# Patient Record
Sex: Female | Born: 1960 | Race: Black or African American | Hispanic: No | Marital: Single | Smoking: Never smoker
Health system: Southern US, Community
[De-identification: ages and names within clinical notes are randomized; demographics above are authoritative.]

## PROBLEM LIST (undated history)

## (undated) DIAGNOSIS — E559 Vitamin D deficiency, unspecified: Secondary | ICD-10-CM

## (undated) DIAGNOSIS — K219 Gastro-esophageal reflux disease without esophagitis: Secondary | ICD-10-CM

## (undated) DIAGNOSIS — H409 Unspecified glaucoma: Secondary | ICD-10-CM

## (undated) DIAGNOSIS — E785 Hyperlipidemia, unspecified: Secondary | ICD-10-CM

## (undated) DIAGNOSIS — R87619 Unspecified abnormal cytological findings in specimens from cervix uteri: Secondary | ICD-10-CM

## (undated) DIAGNOSIS — R7303 Prediabetes: Secondary | ICD-10-CM

## (undated) DIAGNOSIS — I10 Essential (primary) hypertension: Secondary | ICD-10-CM

## (undated) HISTORY — DX: Hyperlipidemia, unspecified: E78.5

## (undated) HISTORY — DX: Essential (primary) hypertension: I10

## (undated) HISTORY — DX: Vitamin D deficiency, unspecified: E55.9

## (undated) HISTORY — DX: Unspecified glaucoma: H40.9

## (undated) HISTORY — DX: Prediabetes: R73.03

## (undated) HISTORY — DX: Unspecified abnormal cytological findings in specimens from cervix uteri: R87.619

---

## 2006-10-03 ENCOUNTER — Ambulatory Visit (HOSPITAL_COMMUNITY): Admission: RE | Admit: 2006-10-03 | Discharge: 2006-10-03 | Payer: Self-pay | Admitting: Internal Medicine

## 2007-11-06 ENCOUNTER — Ambulatory Visit: Payer: Self-pay | Admitting: Family Medicine

## 2007-11-06 ENCOUNTER — Ambulatory Visit: Payer: Self-pay | Admitting: *Deleted

## 2008-05-01 ENCOUNTER — Ambulatory Visit: Payer: Self-pay | Admitting: Family Medicine

## 2008-07-17 ENCOUNTER — Ambulatory Visit: Payer: Self-pay | Admitting: Family Medicine

## 2008-10-28 ENCOUNTER — Ambulatory Visit: Payer: Self-pay | Admitting: Family Medicine

## 2008-12-09 ENCOUNTER — Ambulatory Visit: Payer: Self-pay | Admitting: Family Medicine

## 2008-12-23 ENCOUNTER — Telehealth (INDEPENDENT_AMBULATORY_CARE_PROVIDER_SITE_OTHER): Payer: Self-pay | Admitting: *Deleted

## 2008-12-24 ENCOUNTER — Emergency Department (HOSPITAL_COMMUNITY): Admission: EM | Admit: 2008-12-24 | Discharge: 2008-12-24 | Payer: Self-pay | Admitting: Family Medicine

## 2009-01-09 ENCOUNTER — Emergency Department (HOSPITAL_COMMUNITY): Admission: EM | Admit: 2009-01-09 | Discharge: 2009-01-09 | Payer: Self-pay | Admitting: Emergency Medicine

## 2009-01-20 ENCOUNTER — Emergency Department (HOSPITAL_COMMUNITY): Admission: EM | Admit: 2009-01-20 | Discharge: 2009-01-20 | Payer: Self-pay | Admitting: Emergency Medicine

## 2009-01-20 ENCOUNTER — Emergency Department (HOSPITAL_COMMUNITY): Admission: EM | Admit: 2009-01-20 | Discharge: 2009-01-20 | Payer: Self-pay | Admitting: Family Medicine

## 2009-02-20 ENCOUNTER — Ambulatory Visit: Payer: Self-pay | Admitting: Family Medicine

## 2009-03-24 ENCOUNTER — Ambulatory Visit: Payer: Self-pay | Admitting: Family Medicine

## 2009-03-24 LAB — CONVERTED CEMR LAB: GC Probe Amp, Genital: NEGATIVE

## 2009-08-25 ENCOUNTER — Ambulatory Visit: Payer: Self-pay | Admitting: Family Medicine

## 2009-09-01 ENCOUNTER — Ambulatory Visit (HOSPITAL_COMMUNITY): Admission: RE | Admit: 2009-09-01 | Discharge: 2009-09-01 | Payer: Self-pay | Admitting: Family Medicine

## 2009-10-13 ENCOUNTER — Ambulatory Visit: Payer: Self-pay | Admitting: Family Medicine

## 2009-12-02 ENCOUNTER — Ambulatory Visit: Payer: Self-pay | Admitting: Obstetrics and Gynecology

## 2009-12-14 ENCOUNTER — Encounter (INDEPENDENT_AMBULATORY_CARE_PROVIDER_SITE_OTHER): Payer: Self-pay | Admitting: *Deleted

## 2009-12-14 ENCOUNTER — Ambulatory Visit (HOSPITAL_COMMUNITY): Admission: RE | Admit: 2009-12-14 | Discharge: 2009-12-14 | Payer: Self-pay | Admitting: Family Medicine

## 2009-12-17 ENCOUNTER — Ambulatory Visit: Payer: Self-pay | Admitting: Obstetrics and Gynecology

## 2009-12-17 ENCOUNTER — Ambulatory Visit (HOSPITAL_COMMUNITY): Admission: RE | Admit: 2009-12-17 | Discharge: 2009-12-17 | Payer: Self-pay | Admitting: Anesthesiology

## 2009-12-23 ENCOUNTER — Encounter (INDEPENDENT_AMBULATORY_CARE_PROVIDER_SITE_OTHER): Payer: Self-pay | Admitting: *Deleted

## 2010-01-06 ENCOUNTER — Ambulatory Visit: Payer: Self-pay | Admitting: Obstetrics & Gynecology

## 2010-01-06 LAB — CONVERTED CEMR LAB: Pap Smear: NEGATIVE

## 2010-01-07 ENCOUNTER — Encounter: Payer: Self-pay | Admitting: Obstetrics & Gynecology

## 2010-01-07 LAB — CONVERTED CEMR LAB
Trich, Wet Prep: NONE SEEN
Yeast Wet Prep HPF POC: NONE SEEN

## 2010-01-29 ENCOUNTER — Encounter: Payer: Self-pay | Admitting: Advanced Practice Midwife

## 2010-01-29 ENCOUNTER — Ambulatory Visit: Payer: Self-pay | Admitting: Obstetrics & Gynecology

## 2010-01-31 ENCOUNTER — Encounter: Payer: Self-pay | Admitting: Advanced Practice Midwife

## 2010-03-23 ENCOUNTER — Ambulatory Visit: Payer: Self-pay | Admitting: Gastroenterology

## 2010-03-23 ENCOUNTER — Encounter (INDEPENDENT_AMBULATORY_CARE_PROVIDER_SITE_OTHER): Payer: Self-pay | Admitting: *Deleted

## 2010-03-23 DIAGNOSIS — R143 Flatulence: Secondary | ICD-10-CM

## 2010-03-23 DIAGNOSIS — R142 Eructation: Secondary | ICD-10-CM

## 2010-03-23 DIAGNOSIS — R141 Gas pain: Secondary | ICD-10-CM | POA: Insufficient documentation

## 2010-03-24 LAB — CONVERTED CEMR LAB
ALT: 26 units/L (ref 0–35)
Basophils Relative: 0.5 % (ref 0.0–3.0)
Calcium: 9.5 mg/dL (ref 8.4–10.5)
Creatinine, Ser: 0.8 mg/dL (ref 0.4–1.2)
Eosinophils Relative: 2.1 % (ref 0.0–5.0)
GFR calc non Af Amer: 101.12 mL/min (ref >60.00)
Glucose, Bld: 100 mg/dL — ABNORMAL HIGH (ref 70–99)
Hemoglobin: 13 g/dL (ref 12.0–15.0)
Lymphocytes Relative: 41.8 % (ref 12.0–46.0)
Lymphs Abs: 2.5 10*3/uL (ref 0.7–4.0)
MCV: 88.5 fL (ref 78.0–100.0)
Monocytes Absolute: 0.6 10*3/uL (ref 0.1–1.0)
Neutro Abs: 2.7 10*3/uL (ref 1.4–7.7)
Platelets: 292 10*3/uL (ref 150.0–400.0)
Potassium: 4 meq/L (ref 3.5–5.1)
RBC: 4.34 M/uL (ref 3.87–5.11)
Total Bilirubin: 0.8 mg/dL (ref 0.3–1.2)

## 2010-05-05 ENCOUNTER — Other Ambulatory Visit: Payer: Self-pay | Admitting: Gastroenterology

## 2010-05-05 NOTE — Letter (Signed)
Summary: New Patient letter  [facility]  [address] [place], [place] [address]   Phone: [phone]  Fax: [phone]       12/23/2009 MRN: [id number]  [facility] [address] [place], [place]  [address]  Dear Ms. Domine,  Welcome to the Gastroenterology Division at [facility].    You are scheduled to see Dr.  Christella Hartigan on 02-05-10 at 10:30a.m. on the 3rd floor at [facility], [address]  We ask that you try to arrive at our office 15 minutes prior to your appointment time to allow for check-in.  We would like you to complete the enclosed self-administered evaluation form prior to your visit and bring it with you on the day of your appointment.  We will review it with you.  Also, please bring a complete list of all your medications or, if you prefer, bring the medication bottles and we will list them.  Please bring your insurance card so that we may make a copy of it.  If your insurance requires a referral to see a specialist, please bring your referral form from your primary care physician.  Co-payments are due at the time of your visit and may be paid by cash, check or credit card.     Your office visit will consist of a consult with your physician (includes a physical exam), any laboratory testing he/she may order, scheduling of any necessary diagnostic testing (e.g. x-ray, ultrasound, CT-scan), and scheduling of a procedure (e.g. Endoscopy, Colonoscopy) if required.  Please allow enough time on your schedule to allow for any/all of these possibilities.    If you cannot keep your appointment, please call [phone] to cancel or reschedule prior to your appointment date.  This allows [place] the opportunity to schedule an appointment for another patient in need of care.  If you do not cancel or reschedule by 5 p.m. the business day prior to your appointment date, you will be charged a $50.00 late cancellation/no-show fee.    Thank you for choosing   Gastroenterology for your medical needs.  We appreciate the opportunity to care for you.  Please visit [place] at our website  to learn more about our practice.                     Sincerely,                                                             The Gastroenterology Division

## 2010-05-06 NOTE — Assessment & Plan Note (Signed)
   History of Present Illness Visit Type: Initial Consult Primary GI MD: Rob Bunting MD Primary Iven Earnhart: Wilburn Cornelia Requesting Francia Verry: Aneta Mins Rose,MD Chief Complaint: Abdominal bloating History of Present Illness:     very pleasant 50 year old African born woman who has had postprandial bloating, minor early satiety.  No particular foods are the culprit.eating causes bloating.  Early satiety.  This has been going on for about two years. She tried antiacid medicine in the past, without much help.  Overall stable weight in past year. No nausea or vomiting.   No real abd pains.    she had an abdominal ultrasound 3 months ago and it was normal.  no nsaids           Current Medications (verified): 1)  None  Allergies (verified): No Known Drug Allergies  Past History:  Past Medical History: none  Past Surgical History: c-section 13 years ago  Family History: no gastric disease  Social History: single  one child non smoker non drinker works part time at [employer]  Review of Systems       Pertinent positive and negative review of systems were noted in the above HPI and GI specific review of systems.  All other review of systems was otherwise negative.   Vital Signs:  Patient profile:   50 year old female Height:      66 inches Weight:      181 pounds BMI:     29.32 BSA:     1.92 Pulse rate:   80 / minute Pulse rhythm:   regular BP sitting:   92 / 62  (left arm)  Vitals Entered By: Merri Ray CMA Duncan Dull) (March 23, 2010 1:30 PM)  Physical Exam  Additional Exam:  Constitutional: generally well appearing Psychiatric: alert and oriented times 3 Eyes: extraocular movements intact Mouth: oropharynx moist, no lesions Neck: supple, no lymphadenopathy Cardiovascular: heart regular rate and rythm Lungs: CTA bilaterally Abdomen: soft, non-tender, non-distended, no obvious ascites, no peritoneal signs, normal bowel sounds Extremities: no lower  extremity edema bilaterally Skin: no lesions on visible extremities    Impression & Recommendations:  Problem # 1:  dyspepsia, bloating she will get basic set of labs today including a CBC and complete metabolic profile and we will arrange for upper endoscopy at her soonest convenience.  Other Orders: TLB-CBC Platelet - w/Differential (85025-CBCD) TLB-CMP (Comprehensive Metabolic Pnl) (80053-COMP)  Patient Instructions: 1)  You will get lab test(s) done today (cbc, cmet). 2)  You will be scheduled to have an upper endoscopy. 3)  The medication list was reviewed and reconciled.  All changed / newly prescribed medications were explained.  A complete medication list was provided to the patient / caregiver.  Appended Document: Orders Update/EGD    Clinical Lists Changes  Orders: Added new Test order of EGD (EGD) - Signed

## 2010-05-06 NOTE — Letter (Signed)
Summary: EGD Instructions  [facility]  [address] [place], [place] [address]   Phone: [phone]  Fax: [phone]       Amy [name]    06-21-1960    MRN: [id number]       Procedure Day /Date:05/05/10 WED     Arrival Time: 130 pm     Procedure Time:230 pm     Location of Procedure:                    [facility] (4th Floor)    PREPARATION FOR ENDOSCOPY   On2/1/12 THE DAY OF THE PROCEDURE:  1.   No solid foods, milk or milk products are allowed after midnight the night before your procedure.  2.   Do not drink anything colored red or purple.  Avoid juices with pulp.  No orange juice.  3.  You may drink clear liquids until 1230 pm  which is 2 hours before your procedure.                                                                                                CLEAR LIQUIDS INCLUDE: Water Jello Ice Popsicles Tea (sugar ok, no milk/cream) Powdered fruit flavored drinks Coffee (sugar ok, no milk/cream) Gatorade Juice: apple, white grape, white cranberry  Lemonade Clear bullion, consomm, broth Carbonated beverages (any kind) Strained chicken noodle soup Hard Candy   MEDICATION INSTRUCTIONS  Unless otherwise instructed, you should take regular prescription medications with a small sip of water as early as possible the morning of your procedure.             OTHER INSTRUCTIONS  You will need a responsible adult at least 49 years of age to accompany you and drive you home.   This person must remain in the waiting room during your procedure.  Wear loose fitting clothing that is easily removed.  Leave jewelry and other valuables at home.  However, you may wish to bring a book to read or an iPod/MP3 player to listen to music as you wait for your procedure to start.  Remove all body piercing jewelry and leave at home.  Total time from sign-in until discharge is approximately 2-3 hours.  You should go home directly after  your procedure and rest.  You can resume normal activities the day after your procedure.  The day of your procedure you should not:   Drive   Make legal decisions   Operate machinery   Drink alcohol   Return to work  You will receive specific instructions about eating, activities and medications before you leave.    The above instructions have been reviewed and explained to me by   _______________________    I fully understand and can verbalize these instructions _____________________________ Date _________

## 2010-06-08 ENCOUNTER — Inpatient Hospital Stay (HOSPITAL_COMMUNITY)
Admission: RE | Admit: 2010-06-08 | Discharge: 2010-06-08 | Disposition: A | Discharge status: Home / Self Care | Payer: Self-pay | Source: Ambulatory Visit | Attending: Family Medicine | Admitting: Family Medicine

## 2010-06-18 LAB — POCT PREGNANCY, URINE: Preg Test, Ur: NEGATIVE

## 2010-06-30 ENCOUNTER — Ambulatory Visit (INDEPENDENT_AMBULATORY_CARE_PROVIDER_SITE_OTHER): Payer: Self-pay | Admitting: Obstetrics & Gynecology

## 2010-06-30 DIAGNOSIS — N899 Noninflammatory disorder of vagina, unspecified: Secondary | ICD-10-CM

## 2010-07-01 ENCOUNTER — Encounter: Payer: Self-pay | Admitting: *Deleted

## 2010-07-01 LAB — CONVERTED CEMR LAB
Clue Cells Wet Prep HPF POC: NONE SEEN
Yeast Wet Prep HPF POC: NONE SEEN

## 2010-07-01 NOTE — Progress Notes (Signed)
NAMEOCEANIA, NOORI                ACCOUNT NO.:  [id number]  MEDICAL RECORD NO.:  [id number]           PATIENT TYPE:  A  LOCATION:  [facility]                   FACILITY:  [facility]  PHYSICIAN:  Scheryl Darter, MD       DATE OF BIRTH:  05/25/1960  DATE OF SERVICE:  06/30/2010                                 CLINIC NOTE  HISTORY OF PRESENT ILLNESS:  The patient comes today with recurrent symptoms of external vaginal irritation.  She notes no discharge or odor.  She was screened for possible genital herpes at her last visit in October and serology was negative for HSV-2.  She says that these symptoms recur every few weeks and has burning at the left side of her introitus.  It is not related to intercourse.  She denies any invasive vasomotor symptoms.  PHYSICAL EXAMINATION:  GENERAL:  She is in no acute distress with normal affect. PELVIC:  Normal-appearing external genitalia.  At the introitus and vagina, the mucosa appears somewhat pale and may be showing early signs of atrophy.  Her pain appears to be at the hymeneal ring on the left side.  Cervix appeared normal.  Wet prep was obtained.  No pelvic masses or tenderness.  IMPRESSION:  Vulvar irritation in a 50 year old female with mild signs of vaginal atrophy.  I will try supplemental estrogen with Estrace cream 0.01%, 1 gram per vagina 3 times a week.  She will return in 6-8 weeks to review her progress.     Scheryl Darter, MD    JA/MEDQ  D:  06/30/2010  T:  07/01/2010  Job:  [id number]

## 2010-07-08 LAB — CBC
HCT: 37.7 % (ref 36.0–46.0)
MCHC: 34.4 g/dL (ref 30.0–36.0)
MCV: 93.2 fL (ref 78.0–100.0)
RDW: 14.2 % (ref 11.5–15.5)

## 2010-07-08 LAB — COMPREHENSIVE METABOLIC PANEL
ALT: 24 U/L (ref 0–35)
Albumin: 3.7 g/dL (ref 3.5–5.2)
Alkaline Phosphatase: 44 U/L (ref 39–117)
CO2: 25 mEq/L (ref 19–32)
Calcium: 9 mg/dL (ref 8.4–10.5)
Chloride: 104 mEq/L (ref 96–112)
Creatinine, Ser: 0.91 mg/dL (ref 0.4–1.2)
Glucose, Bld: 97 mg/dL (ref 70–99)
Total Bilirubin: 0.6 mg/dL (ref 0.3–1.2)
Total Protein: 6.8 g/dL (ref 6.0–8.3)

## 2010-07-08 LAB — POCT CARDIAC MARKERS: Troponin i, poc: <0.05 ng/mL (ref 0.00–0.09)

## 2010-07-08 LAB — LIPASE, BLOOD: Lipase: 18 U/L (ref 11–59)

## 2010-07-08 LAB — DIFFERENTIAL
Basophils Relative: 1 % (ref 0–1)
Eosinophils Relative: 3 % (ref 0–5)
Neutro Abs: 1.1 10*3/uL — ABNORMAL LOW (ref 1.7–7.7)

## 2010-08-25 ENCOUNTER — Ambulatory Visit: Payer: Self-pay | Admitting: Obstetrics & Gynecology

## 2010-08-25 ENCOUNTER — Ambulatory Visit: Payer: Self-pay | Admitting: Family Medicine

## 2010-09-23 ENCOUNTER — Other Ambulatory Visit (HOSPITAL_COMMUNITY)
Admission: RE | Admit: 2010-09-23 | Discharge: 2010-09-23 | Disposition: A | Discharge status: Home / Self Care | Payer: Self-pay | Source: Ambulatory Visit | Attending: Obstetrics & Gynecology | Admitting: Obstetrics & Gynecology

## 2010-09-23 ENCOUNTER — Ambulatory Visit (INDEPENDENT_AMBULATORY_CARE_PROVIDER_SITE_OTHER): Payer: Self-pay | Admitting: Obstetrics & Gynecology

## 2010-09-23 ENCOUNTER — Other Ambulatory Visit: Payer: Self-pay | Admitting: Obstetrics & Gynecology

## 2010-09-23 DIAGNOSIS — D259 Leiomyoma of uterus, unspecified: Secondary | ICD-10-CM

## 2010-09-23 DIAGNOSIS — N898 Other specified noninflammatory disorders of vagina: Secondary | ICD-10-CM

## 2010-09-23 DIAGNOSIS — N843 Polyp of vulva: Secondary | ICD-10-CM | POA: Insufficient documentation

## 2010-09-24 NOTE — Group Therapy Note (Unsigned)
NAMESEDALIA, GREESON                ACCOUNT NO.:  [id number]  MEDICAL RECORD NO.:  [id number]           PATIENT TYPE:  A  LOCATION:  [facility]                   FACILITY:  [facility]  PHYSICIAN:  Allie Bossier, MD        DATE OF BIRTH:  [birth year]  DATE OF SERVICE:  09/23/2010                                 CLINIC NOTE  Ms. Guercio is a 50 year old divorced African American gravida 2, para 1, abortus 1 who has been seen in the clinic on several times for vulvar lesions.  She says that for the last 3 years she has had one specific area just to the outside of her labia minora on the left.  This itching, burning type pain comes every few weeks for the last 3 years.  She was given triamcinolone cream and that did not help.  She was given 2% Xylocaine gel and that did not help.  She has had HSV culture which was negative.  The HSV II IgG was negative and HSV-1 IgG titer was in the exposure range.  On exam, there is a slight cracked open area just to the outside of her labia minora.  This is where she points to and says this is the location of her recurrent pain.  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  Therefore, I explained the procedure. Consents were signed, questions were answered and that area of her vulva was prepped with iodine.  It was then numbed with approximately 2 mL of 1% lidocaine and small (3 mm) portion of skin was removed.  Silver nitrate was used to yield excellent hemostasis.  She will come back in 2 weeks for her results.     Allie Bossier, MD    MCD/MEDQ  D:  09/23/2010  T:  09/24/2010  Job:  [id number]

## 2010-10-07 ENCOUNTER — Ambulatory Visit (INDEPENDENT_AMBULATORY_CARE_PROVIDER_SITE_OTHER): Payer: Self-pay | Admitting: Family Medicine

## 2010-10-07 DIAGNOSIS — D4959 Neoplasm of unspecified behavior of other genitourinary organ: Secondary | ICD-10-CM

## 2010-10-08 NOTE — Group Therapy Note (Signed)
Amy [name], Amy [name]                ACCOUNT NO.:  [id number]  MEDICAL RECORD NO.:  [id number]           PATIENT TYPE:  A  LOCATION:  [facility]                   FACILITY:  [facility]  PHYSICIAN:  Tinnie Gens, MD        DATE OF BIRTH:  1960/10/16  DATE OF SERVICE:  10/07/2010                                 CLINIC NOTE  CHIEF COMPLAINT:  Followup vulvar biopsy.  HISTORY OF PRESENT ILLNESS:  The patient is a 50 year old G2, P1-0-1-1, who underwent a vulvar biopsy on September 23, 2010, with Dr. Nicholaus Bloom.  Her results are back today and reviewed with the patient.  The vulvar biopsy results are as follows, benign fibroid epithelial polyp.  No dysplasia or evidence of malignancy.  The patient also underwent STD testing which did not really show her to have HSV.  The patient has irritation, would like something to treat this area.  We discussed possible causes of this finding.  PHYSICAL EXAMINATION:  VITAL SIGNS:  Are as noted in the chart. GENERAL:  Well-developed, well-nourished female in no acute distress. ABDOMEN:  Soft, nontender, nondistended.  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  PLAN:  I have given her 1% hydrocortisone cream very low dose of low- potency and to be applied very sparingly to affected area, hopefully this will help with that and discussed the proper hygiene issues as well.  The patient will follow up as needed.          ______________________________ Tinnie Gens, MD    TP/MEDQ  D:  10/07/2010  T:  10/08/2010  Job:  [id number]

## 2010-11-17 ENCOUNTER — Inpatient Hospital Stay (INDEPENDENT_AMBULATORY_CARE_PROVIDER_SITE_OTHER)
Admission: RE | Admit: 2010-11-17 | Discharge: 2010-11-17 | Disposition: A | Discharge status: Home / Self Care | Payer: Self-pay | Source: Ambulatory Visit | Attending: Family Medicine | Admitting: Family Medicine

## 2010-11-17 DIAGNOSIS — K209 Esophagitis, unspecified without bleeding: Secondary | ICD-10-CM

## 2010-11-17 LAB — BILIRUBIN, TOTAL: Total Bilirubin: 0.2 mg/dL — ABNORMAL LOW (ref 0.3–1.2)

## 2010-11-17 LAB — AST: AST: 18 U/L (ref 0–37)

## 2010-11-17 LAB — ALT: ALT: 19 U/L (ref 0–35)

## 2010-11-17 LAB — GAMMA GT: GGT: 16 U/L (ref 7–51)

## 2010-11-17 LAB — AMYLASE: Amylase: 80 U/L (ref 0–105)

## 2010-11-17 LAB — LIPASE, BLOOD: Lipase: 33 U/L (ref 11–59)

## 2011-01-03 ENCOUNTER — Other Ambulatory Visit: Payer: Self-pay | Admitting: Family Medicine

## 2011-10-06 ENCOUNTER — Emergency Department (INDEPENDENT_AMBULATORY_CARE_PROVIDER_SITE_OTHER)
Admission: EM | Admit: 2011-10-06 | Discharge: 2011-10-06 | Disposition: A | Discharge status: Home / Self Care | Payer: Self-pay | Source: Home / Self Care | Attending: Emergency Medicine | Admitting: Emergency Medicine

## 2011-10-06 ENCOUNTER — Encounter (HOSPITAL_COMMUNITY): Payer: Self-pay

## 2011-10-06 DIAGNOSIS — K219 Gastro-esophageal reflux disease without esophagitis: Secondary | ICD-10-CM

## 2011-10-06 HISTORY — DX: Gastro-esophageal reflux disease without esophagitis: K21.9

## 2011-10-06 MED ORDER — FAMOTIDINE 20 MG PO TABS: 20.0000 mg | ORAL_TABLET | Freq: Two times a day (BID) | ORAL | Status: DC

## 2011-10-06 MED ORDER — PANTOPRAZOLE SODIUM 40 MG PO TBEC: 40.0000 mg | DELAYED_RELEASE_TABLET | Freq: Every day | ORAL | Status: DC

## 2011-10-06 MED ORDER — GI COCKTAIL ~~LOC~~
ORAL | Status: AC
  Filled 2011-10-06: qty 30

## 2011-10-06 MED ORDER — SUCRALFATE 1 GM/10ML PO SUSP: 1.0000 g | Freq: Four times a day (QID) | ORAL | Status: DC

## 2011-10-06 MED ORDER — GI COCKTAIL ~~LOC~~
30.0000 mL | Freq: Once | ORAL | Status: AC
  Administered 2011-10-06: 30 mL via ORAL

## 2011-10-06 NOTE — ED Notes (Addendum)
Patient here for evaluation of pain in her epigastric area. States the pain woke her at 12 mn, couldn't go back to sleep due to pain . Came to [facility] at 6am, and returned at 11 am when clinic scheduled to open for evaluation. Pain in epigastric area w/o radiation, pain worse w direct palpation of costochondral margin, denies radiation of pain . C/o pain at 12 mn caused  nausea w/o vomiting, c/o dizziness, no sweating /o pain pulsing and pounding, felt like her heart was racing

## 2011-10-06 NOTE — ED Provider Notes (Signed)
History     CSN: [id number]  Arrival date & time 10/06/11  1123   First MD Initiated Contact with Patient 10/06/11 1125      Chief Complaint  Patient presents with  . Chest Pain    (Consider location/radiation/quality/duration/timing/severity/associated sxs/prior treatment) HPI Comments: Patient reports waking up with substernal "burning" chest discomfort this morning. It is worse with lying down, and with lying on her side, and with eating. It not affected with exertion, movement. She has not tried anything for this. No nausea, vomiting, diaphoresis, radiation of her neck or down her arm, or through to her back. No coughing, wheezing, shortness of breath. No fevers. No abdominal pain. No change in her diet. No change in her level of physical activity. She's been in usual state of health up until today. Patient was seen in the [facility] on 11/17/2010 for central substernal chest pain, was thought to have esophagitis/GERD. Similar symptoms in December 2011, was scheduled to have upper endoscopy, which she states that she did not have. Had a negative cardiac workup and a normal abdominal ultrasound in the ER in 01/2009. Patient states that today's pain is identical to this pain that she's had since 2010.   ROS as noted in HPI. All other ROS negative.    Patient is a 51 y.o. female presenting with chest pain. The history is provided by the patient.  Chest Pain The chest pain began 6 - 12 hours ago. Chest pain occurs constantly. The chest pain is unchanged. The pain is associated with eating. The quality of the pain is described as burning (throbbing). The pain does not radiate. Chest pain is worsened by certain positions and eating. Pertinent negatives for primary symptoms include no fever, no shortness of breath, no cough, no wheezing, no palpitations, no abdominal pain, no nausea and no vomiting.  Pertinent negatives for associated symptoms include no weakness. She tried nothing for the symptoms.    Pertinent negatives for past medical history include no CAD, no diabetes, no hyperlipidemia, no hypertension, no MI and no strokes.  Pertinent negatives for family medical history include: no early MI in family.     Past Medical History  Diagnosis Date  . GERD (gastroesophageal reflux disease)     History reviewed. No pertinent past surgical history.  History reviewed. No pertinent family history.  History  Substance Use Topics  . Smoking status: Never Smoker   . Smokeless tobacco: Not on file  . Alcohol Use: No    OB History    Grav Para Term Preterm Abortions TAB SAB Ect Mult Living                  Review of Systems  Constitutional: Negative for fever.  Respiratory: Negative for cough, shortness of breath and wheezing.   Cardiovascular: Positive for chest pain. Negative for palpitations.  Gastrointestinal: Negative for nausea, vomiting and abdominal pain.  Neurological: Negative for weakness.    Allergies  Review of patient's allergies indicates no known allergies.  Home Medications   Current Outpatient Rx  Name Route Sig Dispense Refill  . FAMOTIDINE 20 MG PO TABS Oral Take 1 tablet (20 mg total) by mouth 2 (two) times daily. 40 tablet 0  . PANTOPRAZOLE SODIUM 40 MG PO TBEC Oral Take 1 tablet (40 mg total) by mouth daily. 20 tablet 0  . SUCRALFATE 1 GM/10ML PO SUSP Oral Take 10 mLs (1 g total) by mouth 4 (four) times daily. 10 mL before meals and before bedtime 240  mL 0    BP 108/69  Pulse 72  Temp 98.3 F (36.8 C) (Oral)  Resp 10  SpO2 99%  LMP 09/15/2011  Physical Exam  Nursing note and vitals reviewed. Constitutional: She is oriented to person, place, and time. She appears well-developed and well-nourished. No distress.  HENT:  Head: Normocephalic and atraumatic.  Eyes: Conjunctivae and EOM are normal.  Neck: Normal range of motion.  Cardiovascular: Normal rate, regular rhythm, normal heart sounds and intact distal pulses.   Pulmonary/Chest:  Effort normal and breath sounds normal.    Abdominal: Soft. Bowel sounds are normal. She exhibits no distension and no mass. There is tenderness. There is no rebound and no guarding.  Musculoskeletal: Normal range of motion. She exhibits no edema.  Neurological: She is alert and oriented to person, place, and time. Coordination normal.  Skin: Skin is warm and dry.  Psychiatric: She has a normal mood and affect. Her behavior is normal. Judgment and thought content normal.    ED Course  Procedures (including critical care time)  Labs Reviewed - No data to display No results found.   1. GERD (gastroesophageal reflux disease)       MDM  Previous records reviewed. As noted in history of present illness.  EKG: Normal sinus rhythm, rate 66, normal axis, normal intervals. No hypertrophy. No ST T-wave changes compared to EKG from August 2012.  Patient states the pain is identical to pain that she's been having since 2010. Her EKG is normal. H&P most consistent with esophagitis/reflux. Low suspicion for ACS. gave GI cocktail here with improvement on reevaluation. Will start her on some Pepcid, Protonix, carafate and have her followup with her primary care physician at [facility]. Discussed signs and symptoms that should prompt her return to the ER. Patient agrees with plan.  Luiz Blare, MD 10/06/11 1254

## 2012-04-16 ENCOUNTER — Other Ambulatory Visit: Payer: Self-pay | Admitting: Obstetrics and Gynecology

## 2012-04-16 DIAGNOSIS — Z1231 Encounter for screening mammogram for malignant neoplasm of breast: Secondary | ICD-10-CM

## 2012-04-26 ENCOUNTER — Ambulatory Visit (HOSPITAL_COMMUNITY)
Admission: RE | Admit: 2012-04-26 | Discharge: 2012-04-26 | Disposition: A | Discharge status: Home / Self Care | Payer: BC Managed Care – PPO | Source: Ambulatory Visit | Attending: Obstetrics and Gynecology | Admitting: Obstetrics and Gynecology

## 2012-04-26 DIAGNOSIS — Z1231 Encounter for screening mammogram for malignant neoplasm of breast: Secondary | ICD-10-CM

## 2012-07-03 ENCOUNTER — Emergency Department (HOSPITAL_BASED_OUTPATIENT_CLINIC_OR_DEPARTMENT_OTHER)
Admission: EM | Admit: 2012-07-03 | Discharge: 2012-07-04 | Disposition: A | Discharge status: Home / Self Care | Payer: BC Managed Care – PPO | Attending: Emergency Medicine | Admitting: Emergency Medicine

## 2012-07-03 ENCOUNTER — Encounter (HOSPITAL_BASED_OUTPATIENT_CLINIC_OR_DEPARTMENT_OTHER): Payer: Self-pay | Admitting: *Deleted

## 2012-07-03 DIAGNOSIS — R197 Diarrhea, unspecified: Secondary | ICD-10-CM | POA: Insufficient documentation

## 2012-07-03 DIAGNOSIS — R112 Nausea with vomiting, unspecified: Secondary | ICD-10-CM

## 2012-07-03 DIAGNOSIS — R1013 Epigastric pain: Secondary | ICD-10-CM | POA: Insufficient documentation

## 2012-07-03 DIAGNOSIS — K219 Gastro-esophageal reflux disease without esophagitis: Secondary | ICD-10-CM | POA: Insufficient documentation

## 2012-07-03 DIAGNOSIS — R630 Anorexia: Secondary | ICD-10-CM | POA: Insufficient documentation

## 2012-07-03 DIAGNOSIS — Z3202 Encounter for pregnancy test, result negative: Secondary | ICD-10-CM | POA: Insufficient documentation

## 2012-07-03 DIAGNOSIS — Z79899 Other long term (current) drug therapy: Secondary | ICD-10-CM | POA: Insufficient documentation

## 2012-07-03 LAB — URINALYSIS, ROUTINE W REFLEX MICROSCOPIC
Bilirubin Urine: NEGATIVE
Nitrite: NEGATIVE
Specific Gravity, Urine: 1.016 (ref 1.005–1.030)
pH: 7.5 (ref 5.0–8.0)

## 2012-07-03 LAB — CBC WITH DIFFERENTIAL/PLATELET
Eosinophils Absolute: 0.1 10*3/uL (ref 0.0–0.7)
Hemoglobin: 11.4 g/dL — ABNORMAL LOW (ref 12.0–15.0)
Lymphs Abs: 0.7 10*3/uL (ref 0.7–4.0)
MCH: 24.3 pg — ABNORMAL LOW (ref 26.0–34.0)
Monocytes Relative: 8 % (ref 3–12)
Neutro Abs: 5.5 10*3/uL (ref 1.7–7.7)
Neutrophils Relative %: 81 % — ABNORMAL HIGH (ref 43–77)
RBC: 4.69 MIL/uL (ref 3.87–5.11)

## 2012-07-03 NOTE — ED Notes (Signed)
C/o upper bad pain since 7 pm with diarrhea and vomiting. Denies fever.

## 2012-07-03 NOTE — ED Notes (Signed)
Pt has not been taking meds for GERD

## 2012-07-04 LAB — COMPREHENSIVE METABOLIC PANEL
Alkaline Phosphatase: 54 U/L (ref 39–117)
BUN: 9 mg/dL (ref 6–23)
CO2: 26 mEq/L (ref 19–32)
Chloride: 103 mEq/L (ref 96–112)
GFR calc Af Amer: >90 mL/min (ref >90)
GFR calc non Af Amer: 85 mL/min — ABNORMAL LOW (ref >90)
Glucose, Bld: 144 mg/dL — ABNORMAL HIGH (ref 70–99)
Potassium: 4 mEq/L (ref 3.5–5.1)
Total Bilirubin: 0.3 mg/dL (ref 0.3–1.2)

## 2012-07-04 LAB — LIPASE, BLOOD: Lipase: 18 U/L (ref 11–59)

## 2012-07-04 MED ORDER — ONDANSETRON HCL 4 MG/2ML IJ SOLN
4.0000 mg | Freq: Once | INTRAMUSCULAR | Status: AC
  Administered 2012-07-04: 4 mg via INTRAVENOUS
  Filled 2012-07-04: qty 2

## 2012-07-04 MED ORDER — ONDANSETRON 8 MG PO TBDP: 8.0000 mg | ORAL_TABLET | Freq: Three times a day (TID) | ORAL | Status: DC | PRN

## 2012-07-04 MED ORDER — HYDROMORPHONE HCL PF 1 MG/ML IJ SOLN
1.0000 mg | Freq: Once | INTRAMUSCULAR | Status: AC
  Administered 2012-07-04: 1 mg via INTRAVENOUS
  Filled 2012-07-04: qty 1

## 2012-07-04 MED ORDER — SODIUM CHLORIDE 0.9 % IV BOLUS (SEPSIS)
1000.0000 mL | Freq: Once | INTRAVENOUS | Status: AC
  Administered 2012-07-04: 1000 mL via INTRAVENOUS

## 2012-07-04 NOTE — ED Provider Notes (Signed)
History     CSN: [id number]  Arrival date & time 07/03/12  2311   First MD Initiated Contact with Patient 07/04/12 0102      Chief Complaint  Patient presents with  . Abdominal Pain  . Emesis    (Consider location/radiation/quality/duration/timing/severity/associated sxs/prior treatment) Patient is a 52 y.o. female presenting with abdominal pain and vomiting. The history is provided by the patient. The history is limited by a language barrier. No language interpreter was used.  Abdominal Pain Pain location:  Epigastric Pain quality: burning   Pain radiates to:  Epigastric region Pain severity:  Severe Onset quality:  Sudden Timing:  Constant Progression:  Unchanged Chronicity:  New Context: eating and previous surgery   Context: not alcohol use, not awakening from sleep, not diet changes and not laxative use   Relieved by:  Nothing Worsened by:  Nothing tried Ineffective treatments:  OTC medications Associated symptoms: anorexia, diarrhea and vomiting   Risk factors: no alcohol abuse   Emesis Associated symptoms: abdominal pain and diarrhea     Past Medical History  Diagnosis Date  . GERD (gastroesophageal reflux disease)     History reviewed. No pertinent past surgical history.  History reviewed. No pertinent family history.  History  Substance Use Topics  . Smoking status: Never Smoker   . Smokeless tobacco: Not on file  . Alcohol Use: No    OB History   Grav Para Term Preterm Abortions TAB SAB Ect Mult Living                  Review of Systems  Gastrointestinal: Positive for vomiting, abdominal pain, diarrhea and anorexia.  All other systems reviewed and are negative.    Allergies  Review of patient's allergies indicates no known allergies.  Home Medications   Current Outpatient Rx  Name  Route  Sig  Dispense  Refill  . famotidine (PEPCID) 20 MG tablet   Oral   Take 1 tablet (20 mg total) by mouth 2 (two) times daily.   40 tablet   0   .  pantoprazole (PROTONIX) 40 MG tablet   Oral   Take 1 tablet (40 mg total) by mouth daily.   20 tablet   0   . EXPIRED: sucralfate (CARAFATE) 1 GM/10ML suspension   Oral   Take 10 mLs (1 g total) by mouth 4 (four) times daily. 10 mL before meals and before bedtime   240 mL   0     BP 128/78  Pulse 84  Temp(Src) 98.4 F (36.9 C) (Oral)  Resp 18  Ht 5\' 5"  (1.651 m)  Wt 186 lb (84.369 kg)  BMI 30.95 kg/m2  SpO2 97%  Physical Exam  Nursing note and vitals reviewed. Constitutional: She appears well-developed and well-nourished.  HENT:  Head: Normocephalic and atraumatic.  Eyes: Conjunctivae and EOM are normal. Pupils are equal, round, and reactive to light.  Neck: Normal range of motion. Neck supple.  Cardiovascular: Normal rate, regular rhythm, normal heart sounds and intact distal pulses.   Pulmonary/Chest: Effort normal and breath sounds normal.  Abdominal: Soft. Bowel sounds are normal. There is tenderness.  Mild epigastric tenderness  Musculoskeletal: Normal range of motion.  Neurological: She is alert.  Skin: Skin is warm and dry.  Psychiatric: She has a normal mood and affect. Thought content normal.    ED Course  Procedures (including critical care time)  Labs Reviewed  CBC WITH DIFFERENTIAL - Abnormal; Notable for the following:    Hemoglobin  11.4 (*)    MCH 24.3 (*)    Neutrophils Relative 81 (*)    Lymphocytes Relative 10 (*)    All other components within normal limits  COMPREHENSIVE METABOLIC PANEL - Abnormal; Notable for the following:    Glucose, Bld 144 (*)    GFR calc non Af Amer 85 (*)    All other components within normal limits  URINALYSIS, ROUTINE W REFLEX MICROSCOPIC  LIPASE, BLOOD   No results found.   No diagnosis found.    MDM  Patient feels improved after medications. Labs are can only for mild anemia with hemoglobin 11.4 and glucose of 144. Her abdomen is soft and nontender. She is taking    Hilario Quarry, MD 07/04/12 [phone]

## 2012-07-04 NOTE — ED Notes (Signed)
MD at bedside. 

## 2014-02-17 ENCOUNTER — Other Ambulatory Visit: Payer: Self-pay | Admitting: Obstetrics & Gynecology

## 2014-02-18 LAB — CYTOLOGY - PAP

## 2014-04-04 DIAGNOSIS — R9389 Abnormal findings on diagnostic imaging of other specified body structures: Secondary | ICD-10-CM

## 2014-04-04 DIAGNOSIS — N840 Polyp of corpus uteri: Secondary | ICD-10-CM

## 2014-04-04 HISTORY — PX: DILATION AND CURETTAGE OF UTERUS: SHX78

## 2014-04-04 HISTORY — DX: Polyp of corpus uteri: N84.0

## 2014-04-04 HISTORY — DX: Abnormal findings on diagnostic imaging of other specified body structures: R93.89

## 2016-02-27 DIAGNOSIS — M545 Low back pain: Secondary | ICD-10-CM | POA: Diagnosis not present

## 2016-02-27 DIAGNOSIS — Y93F2 Activity, caregiving, lifting: Secondary | ICD-10-CM | POA: Diagnosis not present

## 2016-02-29 DIAGNOSIS — Z01419 Encounter for gynecological examination (general) (routine) without abnormal findings: Secondary | ICD-10-CM | POA: Diagnosis not present

## 2016-03-17 DIAGNOSIS — Z3042 Encounter for surveillance of injectable contraceptive: Secondary | ICD-10-CM | POA: Diagnosis not present

## 2016-03-22 DIAGNOSIS — M545 Low back pain: Secondary | ICD-10-CM | POA: Diagnosis not present

## 2016-03-22 DIAGNOSIS — M549 Dorsalgia, unspecified: Secondary | ICD-10-CM | POA: Diagnosis not present

## 2016-04-19 DIAGNOSIS — R079 Chest pain, unspecified: Secondary | ICD-10-CM | POA: Diagnosis not present

## 2016-04-21 DIAGNOSIS — M65252 Calcific tendinitis, left thigh: Secondary | ICD-10-CM | POA: Diagnosis not present

## 2016-04-21 DIAGNOSIS — M47816 Spondylosis without myelopathy or radiculopathy, lumbar region: Secondary | ICD-10-CM | POA: Diagnosis not present

## 2016-04-21 DIAGNOSIS — S39012A Strain of muscle, fascia and tendon of lower back, initial encounter: Secondary | ICD-10-CM | POA: Diagnosis not present

## 2016-04-21 DIAGNOSIS — M5416 Radiculopathy, lumbar region: Secondary | ICD-10-CM | POA: Diagnosis not present

## 2016-04-21 DIAGNOSIS — S76012A Strain of muscle, fascia and tendon of left hip, initial encounter: Secondary | ICD-10-CM | POA: Diagnosis not present

## 2016-04-25 DIAGNOSIS — M545 Low back pain: Secondary | ICD-10-CM | POA: Diagnosis not present

## 2016-04-25 DIAGNOSIS — S76012D Strain of muscle, fascia and tendon of left hip, subsequent encounter: Secondary | ICD-10-CM | POA: Diagnosis not present

## 2016-04-25 DIAGNOSIS — S39012D Strain of muscle, fascia and tendon of lower back, subsequent encounter: Secondary | ICD-10-CM | POA: Diagnosis not present

## 2016-04-25 DIAGNOSIS — M6281 Muscle weakness (generalized): Secondary | ICD-10-CM | POA: Diagnosis not present

## 2016-04-26 DIAGNOSIS — H401134 Primary open-angle glaucoma, bilateral, indeterminate stage: Secondary | ICD-10-CM | POA: Diagnosis not present

## 2016-04-26 DIAGNOSIS — S39012D Strain of muscle, fascia and tendon of lower back, subsequent encounter: Secondary | ICD-10-CM | POA: Diagnosis not present

## 2016-04-26 DIAGNOSIS — M6281 Muscle weakness (generalized): Secondary | ICD-10-CM | POA: Diagnosis not present

## 2016-04-26 DIAGNOSIS — S76012D Strain of muscle, fascia and tendon of left hip, subsequent encounter: Secondary | ICD-10-CM | POA: Diagnosis not present

## 2016-04-26 DIAGNOSIS — M545 Low back pain: Secondary | ICD-10-CM | POA: Diagnosis not present

## 2016-05-04 DIAGNOSIS — M545 Low back pain: Secondary | ICD-10-CM | POA: Diagnosis not present

## 2016-05-04 DIAGNOSIS — S39012D Strain of muscle, fascia and tendon of lower back, subsequent encounter: Secondary | ICD-10-CM | POA: Diagnosis not present

## 2016-05-04 DIAGNOSIS — M6281 Muscle weakness (generalized): Secondary | ICD-10-CM | POA: Diagnosis not present

## 2016-05-04 DIAGNOSIS — S76012D Strain of muscle, fascia and tendon of left hip, subsequent encounter: Secondary | ICD-10-CM | POA: Diagnosis not present

## 2016-05-05 DIAGNOSIS — S76012D Strain of muscle, fascia and tendon of left hip, subsequent encounter: Secondary | ICD-10-CM | POA: Diagnosis not present

## 2016-05-05 DIAGNOSIS — M6281 Muscle weakness (generalized): Secondary | ICD-10-CM | POA: Diagnosis not present

## 2016-05-05 DIAGNOSIS — M545 Low back pain: Secondary | ICD-10-CM | POA: Diagnosis not present

## 2016-05-05 DIAGNOSIS — S39012D Strain of muscle, fascia and tendon of lower back, subsequent encounter: Secondary | ICD-10-CM | POA: Diagnosis not present

## 2016-05-09 DIAGNOSIS — S39012D Strain of muscle, fascia and tendon of lower back, subsequent encounter: Secondary | ICD-10-CM | POA: Diagnosis not present

## 2016-05-09 DIAGNOSIS — M545 Low back pain: Secondary | ICD-10-CM | POA: Diagnosis not present

## 2016-05-09 DIAGNOSIS — S76012D Strain of muscle, fascia and tendon of left hip, subsequent encounter: Secondary | ICD-10-CM | POA: Diagnosis not present

## 2016-05-09 DIAGNOSIS — M6281 Muscle weakness (generalized): Secondary | ICD-10-CM | POA: Diagnosis not present

## 2016-05-10 DIAGNOSIS — R739 Hyperglycemia, unspecified: Secondary | ICD-10-CM | POA: Diagnosis not present

## 2016-05-10 DIAGNOSIS — M6281 Muscle weakness (generalized): Secondary | ICD-10-CM | POA: Diagnosis not present

## 2016-05-10 DIAGNOSIS — E785 Hyperlipidemia, unspecified: Secondary | ICD-10-CM | POA: Diagnosis not present

## 2016-05-10 DIAGNOSIS — E782 Mixed hyperlipidemia: Secondary | ICD-10-CM | POA: Diagnosis not present

## 2016-05-10 DIAGNOSIS — S39012D Strain of muscle, fascia and tendon of lower back, subsequent encounter: Secondary | ICD-10-CM | POA: Diagnosis not present

## 2016-05-10 DIAGNOSIS — S76012D Strain of muscle, fascia and tendon of left hip, subsequent encounter: Secondary | ICD-10-CM | POA: Diagnosis not present

## 2016-05-10 DIAGNOSIS — J301 Allergic rhinitis due to pollen: Secondary | ICD-10-CM | POA: Diagnosis not present

## 2016-05-10 DIAGNOSIS — M545 Low back pain: Secondary | ICD-10-CM | POA: Diagnosis not present

## 2016-05-20 DIAGNOSIS — H401132 Primary open-angle glaucoma, bilateral, moderate stage: Secondary | ICD-10-CM | POA: Diagnosis not present

## 2016-06-06 DIAGNOSIS — R0683 Snoring: Secondary | ICD-10-CM | POA: Diagnosis not present

## 2016-06-06 DIAGNOSIS — G479 Sleep disorder, unspecified: Secondary | ICD-10-CM | POA: Diagnosis not present

## 2016-06-15 DIAGNOSIS — Z309 Encounter for contraceptive management, unspecified: Secondary | ICD-10-CM | POA: Diagnosis not present

## 2016-06-15 DIAGNOSIS — Z3042 Encounter for surveillance of injectable contraceptive: Secondary | ICD-10-CM | POA: Diagnosis not present

## 2016-06-16 DIAGNOSIS — S39012D Strain of muscle, fascia and tendon of lower back, subsequent encounter: Secondary | ICD-10-CM | POA: Diagnosis not present

## 2016-06-16 DIAGNOSIS — S76012D Strain of muscle, fascia and tendon of left hip, subsequent encounter: Secondary | ICD-10-CM | POA: Diagnosis not present

## 2016-06-22 DIAGNOSIS — M545 Low back pain: Secondary | ICD-10-CM | POA: Diagnosis not present

## 2016-06-22 DIAGNOSIS — M6281 Muscle weakness (generalized): Secondary | ICD-10-CM | POA: Diagnosis not present

## 2016-06-22 DIAGNOSIS — S39012D Strain of muscle, fascia and tendon of lower back, subsequent encounter: Secondary | ICD-10-CM | POA: Diagnosis not present

## 2016-06-22 DIAGNOSIS — S76012D Strain of muscle, fascia and tendon of left hip, subsequent encounter: Secondary | ICD-10-CM | POA: Diagnosis not present

## 2016-08-31 DIAGNOSIS — K219 Gastro-esophageal reflux disease without esophagitis: Secondary | ICD-10-CM | POA: Diagnosis not present

## 2016-08-31 DIAGNOSIS — Z1211 Encounter for screening for malignant neoplasm of colon: Secondary | ICD-10-CM | POA: Diagnosis not present

## 2016-08-31 DIAGNOSIS — I10 Essential (primary) hypertension: Secondary | ICD-10-CM | POA: Diagnosis not present

## 2016-08-31 DIAGNOSIS — Z298 Encounter for other specified prophylactic measures: Secondary | ICD-10-CM | POA: Diagnosis not present

## 2016-09-19 DIAGNOSIS — T495X5A Adverse effect of ophthalmological drugs and preparations, initial encounter: Secondary | ICD-10-CM | POA: Diagnosis not present

## 2016-09-19 DIAGNOSIS — H401132 Primary open-angle glaucoma, bilateral, moderate stage: Secondary | ICD-10-CM | POA: Diagnosis not present

## 2016-10-17 DIAGNOSIS — T495X5A Adverse effect of ophthalmological drugs and preparations, initial encounter: Secondary | ICD-10-CM | POA: Diagnosis not present

## 2016-10-17 DIAGNOSIS — H401134 Primary open-angle glaucoma, bilateral, indeterminate stage: Secondary | ICD-10-CM | POA: Diagnosis not present

## 2016-12-12 DIAGNOSIS — N390 Urinary tract infection, site not specified: Secondary | ICD-10-CM | POA: Diagnosis not present

## 2016-12-12 DIAGNOSIS — J019 Acute sinusitis, unspecified: Secondary | ICD-10-CM | POA: Diagnosis not present

## 2016-12-12 DIAGNOSIS — N760 Acute vaginitis: Secondary | ICD-10-CM | POA: Diagnosis not present

## 2016-12-13 DIAGNOSIS — Z124 Encounter for screening for malignant neoplasm of cervix: Secondary | ICD-10-CM | POA: Diagnosis not present

## 2016-12-14 DIAGNOSIS — D259 Leiomyoma of uterus, unspecified: Secondary | ICD-10-CM | POA: Diagnosis not present

## 2016-12-14 DIAGNOSIS — Z1231 Encounter for screening mammogram for malignant neoplasm of breast: Secondary | ICD-10-CM | POA: Diagnosis not present

## 2016-12-27 DIAGNOSIS — H401134 Primary open-angle glaucoma, bilateral, indeterminate stage: Secondary | ICD-10-CM | POA: Diagnosis not present

## 2017-01-05 DIAGNOSIS — Z0000 Encounter for general adult medical examination without abnormal findings: Secondary | ICD-10-CM | POA: Diagnosis not present

## 2017-01-05 DIAGNOSIS — Z136 Encounter for screening for cardiovascular disorders: Secondary | ICD-10-CM | POA: Diagnosis not present

## 2017-01-05 DIAGNOSIS — E559 Vitamin D deficiency, unspecified: Secondary | ICD-10-CM | POA: Diagnosis not present

## 2017-01-19 DIAGNOSIS — R7309 Other abnormal glucose: Secondary | ICD-10-CM | POA: Diagnosis not present

## 2017-04-04 HISTORY — PX: COLONOSCOPY: SHX174

## 2017-04-25 DIAGNOSIS — K219 Gastro-esophageal reflux disease without esophagitis: Secondary | ICD-10-CM | POA: Diagnosis not present

## 2017-04-25 DIAGNOSIS — Z1211 Encounter for screening for malignant neoplasm of colon: Secondary | ICD-10-CM | POA: Diagnosis not present

## 2017-04-25 DIAGNOSIS — E119 Type 2 diabetes mellitus without complications: Secondary | ICD-10-CM | POA: Diagnosis not present

## 2017-04-25 DIAGNOSIS — Z23 Encounter for immunization: Secondary | ICD-10-CM | POA: Diagnosis not present

## 2017-05-22 DIAGNOSIS — J069 Acute upper respiratory infection, unspecified: Secondary | ICD-10-CM | POA: Diagnosis not present

## 2017-05-22 DIAGNOSIS — R0981 Nasal congestion: Secondary | ICD-10-CM | POA: Diagnosis not present

## 2017-06-02 DIAGNOSIS — I10 Essential (primary) hypertension: Secondary | ICD-10-CM | POA: Diagnosis not present

## 2017-06-02 DIAGNOSIS — E119 Type 2 diabetes mellitus without complications: Secondary | ICD-10-CM | POA: Diagnosis not present

## 2017-06-02 DIAGNOSIS — Z1211 Encounter for screening for malignant neoplasm of colon: Secondary | ICD-10-CM | POA: Diagnosis not present

## 2017-07-09 DIAGNOSIS — X102XXA Contact with fats and cooking oils, initial encounter: Secondary | ICD-10-CM | POA: Diagnosis not present

## 2017-07-09 DIAGNOSIS — T23101A Burn of first degree of right hand, unspecified site, initial encounter: Secondary | ICD-10-CM | POA: Diagnosis not present

## 2017-07-09 DIAGNOSIS — T310 Burns involving less than 10% of body surface: Secondary | ICD-10-CM | POA: Diagnosis not present

## 2017-07-13 DIAGNOSIS — I10 Essential (primary) hypertension: Secondary | ICD-10-CM | POA: Diagnosis not present

## 2017-07-13 DIAGNOSIS — Z1211 Encounter for screening for malignant neoplasm of colon: Secondary | ICD-10-CM | POA: Diagnosis not present

## 2017-07-13 DIAGNOSIS — Z7984 Long term (current) use of oral hypoglycemic drugs: Secondary | ICD-10-CM | POA: Diagnosis not present

## 2017-07-13 DIAGNOSIS — Z6834 Body mass index (BMI) 34.0-34.9, adult: Secondary | ICD-10-CM | POA: Diagnosis not present

## 2017-07-13 DIAGNOSIS — E669 Obesity, unspecified: Secondary | ICD-10-CM | POA: Diagnosis not present

## 2017-07-13 DIAGNOSIS — K621 Rectal polyp: Secondary | ICD-10-CM | POA: Diagnosis not present

## 2017-07-13 DIAGNOSIS — E119 Type 2 diabetes mellitus without complications: Secondary | ICD-10-CM | POA: Diagnosis not present

## 2017-07-13 DIAGNOSIS — E079 Disorder of thyroid, unspecified: Secondary | ICD-10-CM | POA: Diagnosis not present

## 2017-07-13 DIAGNOSIS — K6389 Other specified diseases of intestine: Secondary | ICD-10-CM | POA: Diagnosis not present

## 2017-07-13 DIAGNOSIS — Z79899 Other long term (current) drug therapy: Secondary | ICD-10-CM | POA: Diagnosis not present

## 2017-07-13 DIAGNOSIS — K219 Gastro-esophageal reflux disease without esophagitis: Secondary | ICD-10-CM | POA: Diagnosis not present

## 2017-07-17 LAB — HM COLONOSCOPY

## 2017-08-21 DIAGNOSIS — S39012D Strain of muscle, fascia and tendon of lower back, subsequent encounter: Secondary | ICD-10-CM | POA: Diagnosis not present

## 2017-08-22 DIAGNOSIS — Z0000 Encounter for general adult medical examination without abnormal findings: Secondary | ICD-10-CM | POA: Diagnosis not present

## 2017-08-22 DIAGNOSIS — Z01118 Encounter for examination of ears and hearing with other abnormal findings: Secondary | ICD-10-CM | POA: Diagnosis not present

## 2017-08-22 DIAGNOSIS — I10 Essential (primary) hypertension: Secondary | ICD-10-CM | POA: Diagnosis not present

## 2017-08-22 DIAGNOSIS — Z131 Encounter for screening for diabetes mellitus: Secondary | ICD-10-CM | POA: Diagnosis not present

## 2017-08-22 DIAGNOSIS — Z136 Encounter for screening for cardiovascular disorders: Secondary | ICD-10-CM | POA: Diagnosis not present

## 2017-08-22 DIAGNOSIS — R1013 Epigastric pain: Secondary | ICD-10-CM | POA: Diagnosis not present

## 2017-08-22 DIAGNOSIS — E559 Vitamin D deficiency, unspecified: Secondary | ICD-10-CM | POA: Diagnosis not present

## 2017-08-22 DIAGNOSIS — H538 Other visual disturbances: Secondary | ICD-10-CM | POA: Diagnosis not present

## 2017-08-22 DIAGNOSIS — R7303 Prediabetes: Secondary | ICD-10-CM | POA: Diagnosis not present

## 2017-09-20 ENCOUNTER — Ambulatory Visit
Admission: RE | Admit: 2017-09-20 | Discharge: 2017-09-20 | Disposition: A | Discharge status: Home / Self Care | Payer: BLUE CROSS/BLUE SHIELD | Source: Ambulatory Visit | Attending: Family Medicine | Admitting: Family Medicine

## 2017-09-20 ENCOUNTER — Encounter: Payer: Self-pay | Admitting: Family Medicine

## 2017-09-20 ENCOUNTER — Ambulatory Visit: Payer: BLUE CROSS/BLUE SHIELD | Admitting: Family Medicine

## 2017-09-20 VITALS — BP 124/80 | HR 73 | Temp 98.3°F | Resp 16 | Ht 62.0 in | Wt 181.2 lb

## 2017-09-20 DIAGNOSIS — G8929 Other chronic pain: Secondary | ICD-10-CM

## 2017-09-20 DIAGNOSIS — Z7689 Persons encountering health services in other specified circumstances: Secondary | ICD-10-CM

## 2017-09-20 DIAGNOSIS — M545 Low back pain: Secondary | ICD-10-CM

## 2017-09-20 DIAGNOSIS — M47816 Spondylosis without myelopathy or radiculopathy, lumbar region: Secondary | ICD-10-CM | POA: Diagnosis not present

## 2017-09-20 LAB — POCT URINALYSIS DIP (PROADVANTAGE DEVICE)
Bilirubin, UA: NEGATIVE
Glucose, UA: NEGATIVE mg/dL
Ketones, POC UA: NEGATIVE mg/dL
LEUKOCYTES UA: NEGATIVE
NITRITE UA: NEGATIVE
Specific Gravity, Urine: 1.02
UUROB: NEGATIVE
pH, UA: 6 (ref 5.0–8.0)

## 2017-09-20 MED ORDER — DICLOFENAC SODIUM 75 MG PO TBEC: 75.0000 mg | DELAYED_RELEASE_TABLET | Freq: Two times a day (BID) | ORAL | 0 refills | Status: DC

## 2017-09-20 NOTE — Patient Instructions (Signed)
Start the diclofenac. Use heat or ice. You may also try Sal[place] patches.  Go get your XR.   You will receive a phone call from the orthopedist office.     Back Pain, Adult Back pain is very common. The pain often gets better over time. The cause of back pain is usually not dangerous. Most people can learn to manage their back pain on their own. Follow these instructions at home: Watch your back pain for any changes. The following actions may help to lessen any pain you are feeling:  Stay active. Start with short walks on flat ground if you can. Try to walk farther each day.  Exercise regularly as told by your doctor. Exercise helps your back heal faster. It also helps avoid future injury by keeping your muscles strong and flexible.  Do not sit, drive, or stand in one place for more than 30 minutes.  Do not stay in bed. Resting more than 1-2 days can slow down your recovery.  Be careful when you bend or lift an object. Use good form when lifting: ? Bend at your knees. ? Keep the object close to your body. ? Do not twist.  Sleep on a firm mattress. Lie on your side, and bend your knees. If you lie on your back, put a pillow under your knees.  Take medicines only as told by your doctor.  Put ice on the injured area. ? Put ice in a plastic bag. ? Place a towel between your skin and the bag. ? Leave the ice on for 20 minutes, 2-3 times a day for the first 2-3 days. After that, you can switch between ice and heat packs.  Avoid feeling anxious or stressed. Find good ways to deal with stress, such as exercise.  Maintain a healthy weight. Extra weight puts stress on your back.  Contact a doctor if:  You have pain that does not go away with rest or medicine.  You have worsening pain that goes down into your legs or buttocks.  You have pain that does not get better in one week.  You have pain at night.  You lose weight.  You have a fever or chills. Get help right away  if:  You cannot control when you poop (bowel movement) or pee (urinate).  Your arms or legs feel weak.  Your arms or legs lose feeling (numbness).  You feel sick to your stomach (nauseous) or throw up (vomit).  You have belly (abdominal) pain.  You feel like you may pass out (faint). This information is not intended to replace advice given to you by your health care provider. Make sure you discuss any questions you have with your health care provider. Document Released: 09/07/2007 Document Revised: 08/27/2015 Document Reviewed: 07/23/2013 Elsevier Interactive Patient Education  [employer].

## 2017-09-20 NOTE — Progress Notes (Signed)
Subjective:    Patient ID: Amy [name], female    DOB: 1961-01-16, 57 y.o.   MRN: [id number]  HPI Chief Complaint  Patient presents with  . new pt    new pt, back pain for year and half. low back pain, pinching senstation, been to PT, chiropactor back in [place]   She is new to the practice and here with complaints of chronic back pain. She was a patient of Dr. Jorene Minors prior to moving to [place].  States she was in [place] all of 2018 and just moved back.   Complains of a 2 year history of bilateral low back pain. States she fell backwards landing on her back at work approximately 2 years ago prior to the onset of her pain.  Pain is constant and feels like "pinching". Pain occasionally radiates down the back of both legs to her knees.  States her pain is worsening and now she is having pain at night when she turns in the past.   States she was seeing a provider in [place] for her pain and was sent to physical therapy. Prior to moving to [place], she apparently saw a Restaurant manager, fast food here in [place]? States she had an XR there in 2017 for this problem.  States she was taking Hydrocodone PRN for her pain and requests a refill.   Reports trying Aleve and meloxicam in the past.   No loss of control of bowels or bladder. No urinary retention.  Denies numbness, tingling or weakness.  Denies fever, chills, dizziness, chest pain, palpitations, shortness of breath, abdominal pain, N/V/D, urinary symptoms.   Other providers:  Dr. Carlota R[facility]    HTN? Amlodipine and HCTZ  LMP: spotting some months still   Reviewed allergies, medications, past medical, surgical, family, and social history.    Review of Systems Pertinent positives and negatives in the history of present illness.     Objective:   Physical Exam  Constitutional: She is oriented to person, place, and time. She appears well-developed and well-nourished. No distress.  Neck: Normal range of motion. Neck supple.    Cardiovascular: Normal rate, regular rhythm, normal heart sounds and intact distal pulses.  Pulmonary/Chest: Effort normal and breath sounds normal.  Musculoskeletal:       Lumbar back: She exhibits decreased range of motion, tenderness and pain.  Tenderness to light palpation across entire lumbar spine and paraspinal muscles. Normal appearance, curvature and good motion. Lumbar pain with flexion and extension as well as rotation.  Negative straight leg raise. DTRs symmetric decreased bilaterally at patella. LE neurovascularly intact bilatearally   Neurological: She is alert and oriented to person, place, and time. She has normal strength. No cranial nerve deficit or sensory deficit. Gait normal.  No foot drop  Skin: Skin is warm and dry.   BP 124/80   Pulse 73   Temp 98.3 F (36.8 C) (Oral)   Resp 16   Ht 5\' 2"  (1.575 m)   Wt 181 lb 3.2 oz (82.2 kg)   SpO2 98%   BMI 33.14 kg/m       Assessment & Plan:  Chronic bilateral low back pain without sciatica - Plan: POCT Urinalysis DIP (Proadvantage Device), DG Lumbar Spine Complete, diclofenac (VOLTAREN) 75 MG EC tablet, Ambulatory referral to Orthopedic Surgery  Encounter to establish care  No neurological deficits. Will send her for a lumbar spine XR, start her on diclofenac and refer to orthopedist. I am attempting to get her records from [place] and [place]  providers.  Will have her return in 2 weeks to discuss other medical history including ?HTN and recheck urine.

## 2017-09-22 ENCOUNTER — Encounter: Payer: Self-pay | Admitting: Internal Medicine

## 2017-09-27 ENCOUNTER — Telehealth: Payer: Self-pay | Admitting: Family Medicine

## 2017-09-27 NOTE — Telephone Encounter (Signed)
Received records from [facility]. Sending back for review.

## 2017-10-04 ENCOUNTER — Telehealth: Payer: Self-pay | Admitting: Family Medicine

## 2017-10-04 NOTE — Telephone Encounter (Signed)
Received requested info from [facility]. Sending back for review.

## 2017-10-11 ENCOUNTER — Encounter (INDEPENDENT_AMBULATORY_CARE_PROVIDER_SITE_OTHER): Payer: Self-pay | Admitting: Orthopedic Surgery

## 2017-10-11 ENCOUNTER — Ambulatory Visit (INDEPENDENT_AMBULATORY_CARE_PROVIDER_SITE_OTHER): Payer: BLUE CROSS/BLUE SHIELD | Admitting: Orthopedic Surgery

## 2017-10-11 DIAGNOSIS — M5442 Lumbago with sciatica, left side: Secondary | ICD-10-CM

## 2017-10-11 DIAGNOSIS — G8929 Other chronic pain: Secondary | ICD-10-CM | POA: Diagnosis not present

## 2017-10-11 DIAGNOSIS — M5441 Lumbago with sciatica, right side: Secondary | ICD-10-CM

## 2017-10-14 ENCOUNTER — Ambulatory Visit
Admission: RE | Admit: 2017-10-14 | Discharge: 2017-10-14 | Disposition: A | Discharge status: Home / Self Care | Payer: BLUE CROSS/BLUE SHIELD | Source: Ambulatory Visit | Attending: Orthopedic Surgery | Admitting: Orthopedic Surgery

## 2017-10-14 DIAGNOSIS — M5441 Lumbago with sciatica, right side: Principal | ICD-10-CM

## 2017-10-14 DIAGNOSIS — G8929 Other chronic pain: Secondary | ICD-10-CM

## 2017-10-14 DIAGNOSIS — M5442 Lumbago with sciatica, left side: Principal | ICD-10-CM

## 2017-10-15 ENCOUNTER — Encounter (INDEPENDENT_AMBULATORY_CARE_PROVIDER_SITE_OTHER): Payer: Self-pay | Admitting: Orthopedic Surgery

## 2017-10-15 ENCOUNTER — Ambulatory Visit
Admission: RE | Admit: 2017-10-15 | Discharge: 2017-10-15 | Disposition: A | Discharge status: Home / Self Care | Payer: BLUE CROSS/BLUE SHIELD | Source: Ambulatory Visit | Attending: Orthopedic Surgery | Admitting: Orthopedic Surgery

## 2017-10-15 DIAGNOSIS — M48061 Spinal stenosis, lumbar region without neurogenic claudication: Secondary | ICD-10-CM | POA: Diagnosis not present

## 2017-10-15 NOTE — Progress Notes (Signed)
Office Visit Note   [name]           Date of Birth: 1960/11/18           MRN: [id number] Visit Date: 10/11/2017 Requested by: Girtha Rm, NP-C [place], [place] [address] PCP: Girtha Rm, NP-C  Subjective: Chief Complaint  Patient presents with  . Lower Back - Pain    HPI: There is a patient with constant back pain for 2 years.  Denies any history of injury.  Reports both numbness and tingling along with radicular pain.  She has tightness and stiffness in her thighs.  Is worse for her to get up in the morning.  The pain radiates down the front of her legs.  It also radiates below the knees at times.  Takes Norco ibuprofen and occasional diclofenac for the problem.              ROS: All systems reviewed are negative as they relate to the chief complaint within the history of present illness.  Patient denies  fevers or chills.   Assessment & Plan: Visit Diagnoses:  1. Chronic midline low back pain with bilateral sciatica     Plan: Impression is back pain constant for 2 years with not much in the way of degenerative disc disease on plain radiographs.  She does have significant facet arthritis.  Plan MRI lumbar spine to evaluate spinal stenosis versus facet arthritis.  I will see her back after that study.  She may require epidural steroid injections ablation or potential surgery depending on the severity of the degenerative process in her lumbar spine.  Follow-Up Instructions: Return for after MRI.   Orders:  Orders Placed This Encounter  Procedures  . MR Lumbar Spine w/o contrast   No orders of the defined types were placed in this encounter.     Procedures: No procedures performed   Clinical Data: No additional findings.  Objective: Vital Signs: There were no vitals taken for this visit.  Physical Exam:   Constitutional: Patient appears well-developed HEENT:  Head: Normocephalic Eyes:EOM are normal Neck: Normal range of  motion Cardiovascular: Normal rate Pulmonary/chest: Effort normal Neurologic: Patient is alert Skin: Skin is warm Psychiatric: Patient has normal mood and affect    Ortho Exam: Ortho exam demonstrates some pain with extension and flexion.  No groin pain with internal/external rotation of the legs.  No other masses lymph adenopathy or skin changes noted in the back region.  No nerve retention signs today and no paresthesias L1 S1 bilaterally.  No muscle atrophy in the legs.  Patient has good ankle dorsiflexion plantarflexion quad and hamstring strength.  Reflexes symmetric.  Negative clonus.  Specialty Comments:  No specialty comments available.  Imaging: Mr Lumbar Spine W/o Contrast  Result Date: 10/15/2017 CLINICAL DATA:  Chronic midline low back pain with bilateral sciatica EXAM: MRI LUMBAR SPINE WITHOUT CONTRAST TECHNIQUE: Multiplanar, multisequence MR imaging of the lumbar spine was performed. No intravenous contrast was administered. COMPARISON:  None. FINDINGS: Segmentation:  Normal Alignment:  Normal Vertebrae:  Normal bone marrow.  Negative for fracture or mass. Conus medullaris and cauda equina: Conus extends to the L1-2 level. Conus and cauda equina appear normal. Paraspinal and other soft tissues: Negative for paraspinous mass or fluid collection Disc levels: L1-2: Negative L2-3: Negative L3-4: Mild disc and facet degeneration without significant stenosis L4-5: Mild disc and mild facet degeneration. Mild subarticular stenosis bilaterally L5-S1: Severe facet degeneration bilaterally with facet joint effusions  and diffuse facet hypertrophy. Mild disc degeneration. Severe subarticular and foraminal stenosis with bilateral L5 and S1 nerve root impingement. 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 Mild degenerative change L3-4 and L4-5. Electronically Signed   By: Franchot Gallo M.D.   On: 10/15/2017 10:28     PMFS  History: Patient Active Problem List   Diagnosis Date Noted  . Chronic bilateral low back pain without sciatica 09/20/2017  . ABDOMINAL BLOATING 03/23/2010   Past Medical History:  Diagnosis Date  . GERD (gastroesophageal reflux disease)     History reviewed. No pertinent family history.  History reviewed. No pertinent surgical history. Social History   Occupational History  . Not on file  Tobacco Use  . Smoking status: Never Smoker  . Smokeless tobacco: Never Used  Substance and Sexual Activity  . Alcohol use: No  . Drug use: No  . Sexual activity: Not on file

## 2017-11-01 ENCOUNTER — Ambulatory Visit (INDEPENDENT_AMBULATORY_CARE_PROVIDER_SITE_OTHER): Payer: Self-pay

## 2017-11-01 ENCOUNTER — Encounter (INDEPENDENT_AMBULATORY_CARE_PROVIDER_SITE_OTHER): Payer: Self-pay | Admitting: Orthopedic Surgery

## 2017-11-01 ENCOUNTER — Ambulatory Visit (INDEPENDENT_AMBULATORY_CARE_PROVIDER_SITE_OTHER): Payer: BLUE CROSS/BLUE SHIELD | Admitting: Orthopedic Surgery

## 2017-11-01 DIAGNOSIS — G8929 Other chronic pain: Secondary | ICD-10-CM

## 2017-11-01 DIAGNOSIS — M5442 Lumbago with sciatica, left side: Secondary | ICD-10-CM

## 2017-11-01 DIAGNOSIS — M5441 Lumbago with sciatica, right side: Secondary | ICD-10-CM | POA: Diagnosis not present

## 2017-11-01 DIAGNOSIS — M25561 Pain in right knee: Secondary | ICD-10-CM | POA: Diagnosis not present

## 2017-11-01 MED ORDER — BUPIVACAINE HCL 0.25 % IJ SOLN
4.0000 mL | INTRAMUSCULAR | Status: AC | PRN
  Administered 2017-11-01: 4 mL via INTRA_ARTICULAR

## 2017-11-01 MED ORDER — METHYLPREDNISOLONE ACETATE 40 MG/ML IJ SUSP
40.0000 mg | INTRAMUSCULAR | Status: AC | PRN
  Administered 2017-11-01: 40 mg via INTRA_ARTICULAR

## 2017-11-01 MED ORDER — LIDOCAINE HCL 1 % IJ SOLN
5.0000 mL | INTRAMUSCULAR | Status: AC | PRN
  Administered 2017-11-01: 5 mL

## 2017-11-01 NOTE — Progress Notes (Signed)
Office Visit Note   [name]           Date of Birth: Feb 01, 1961           MRN: [id number] Visit Date: 11/01/2017 Requested by: Girtha Rm, NP-C [place], [place] [address] PCP: Girtha Rm, NP-C  Subjective: Chief Complaint  Patient presents with  . Lower Back - Follow-up    HPI: Patient presents for follow-up of MRI scan of her lumbar spine.  She has severe facet disease at L5-S1 both sides with nerve root impingement at L5-S1 on both sides.  Patient rates her pain at 7 out of 10 but she also reports recent development of severe right knee pain on the medial side.  She does have a history of having epidural steroid injections in [place].  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  Currently her knee is more symptomatic than her back.  She denies much in the way of radicular symptoms below the knees.              ROS: All systems reviewed are negative as they relate to the chief complaint within the history of present illness.  Patient denies  fevers or chills.   Assessment & Plan: Visit Diagnoses:  1. Right knee pain, unspecified chronicity   2. Chronic midline low back pain with bilateral sciatica     Plan: Impression is right knee pain with normal radiographs and no effusion.  Primarily on the medial side.  This could represent medial meniscal tearing.  No arthritis on x-rays.  Injection in the knee is performed today and we will see her back in a month for decision for or against MRI scanning.  In regards to her back she has very focal arthritis of the facet joints at L5-S1.  Nerve root impingement is likely.  I think she may be a reasonable candidate for consultation with Dr. Ernestina [name] for radiofrequency ablation to treat this symptomatic arthritis.  I will send her to him for consideration of same.  See her back in 4 weeks for clinical recheck on the right knee  I discussed this with Amy [name] but she  would like for me to send a copy of my note to her son who is a physician in the [employer] [facility].  She would like to have this done because of the language barrier.  Follow-Up Instructions: Return in about 1 month (around 11/29/2017).   Orders:  Orders Placed This Encounter  Procedures  . XR KNEE 3 VIEW RIGHT  . Ambulatory referral to Physical Medicine Rehab   No orders of the defined types were placed in this encounter.     Procedures: Large Joint Inj: R knee on 11/01/2017 4:54 PM Indications: diagnostic evaluation, joint swelling and pain Details: 18 G 1.5 in needle, superolateral approach  Arthrogram: No  Medications: 5 mL lidocaine 1 %; 40 mg methylPREDNISolone acetate 40 MG/ML; 4 mL bupivacaine 0.25 % Outcome: tolerated well, no immediate complications Procedure, treatment alternatives, risks and benefits explained, specific risks discussed. Consent was given by the patient. Immediately prior to procedure a time out was called to verify the correct patient, procedure, equipment, support staff and site/side marked as required. Patient was prepped and draped in the usual sterile fashion.       Clinical Data: No additional findings.  Objective: Vital Signs: There were no vitals taken for this visit.  Physical Exam:   Constitutional:  Patient appears well-developed HEENT:  Head: Normocephalic Eyes:EOM are normal Neck: Normal range of motion Cardiovascular: Normal rate Pulmonary/chest: Effort normal Neurologic: Patient is alert Skin: Skin is warm Psychiatric: Patient has normal mood and affect    Ortho Exam: Ortho exam demonstrates full active and passive range of motion of both knees and hips.  She has no effusion in the right knee.  Collateral cruciate ligaments are stable.  She does have medial joint line tenderness.  Range of motion full extensor mechanism intact.  She has some pain with forward and lateral bending but no definite paresthesias in the legs.  Pedal pulses  palpable.  The rest of her exam is unchanged from last visit regarding the back  Specialty Comments:  No specialty comments available.  Imaging: Xr Knee 3 View Right  Result Date: 11/01/2017 AP lateral merchant right knee reviewed.  No arthritis present.  No effusion fracture or dislocation.  Alignment normal.  Normal right knee    PMFS History: Patient Active Problem List   Diagnosis Date Noted  . Chronic bilateral low back pain without sciatica 09/20/2017  . ABDOMINAL BLOATING 03/23/2010   Past Medical History:  Diagnosis Date  . GERD (gastroesophageal reflux disease)     History reviewed. No pertinent family history.  History reviewed. No pertinent surgical history. Social History   Occupational History  . Not on file  Tobacco Use  . Smoking status: Never Smoker  . Smokeless tobacco: Never Used  Substance and Sexual Activity  . Alcohol use: No  . Drug use: No  . Sexual activity: Not on file

## 2017-11-02 ENCOUNTER — Encounter: Payer: Self-pay | Admitting: Family Medicine

## 2017-11-14 ENCOUNTER — Encounter: Payer: Self-pay | Admitting: Family Medicine

## 2017-11-20 ENCOUNTER — Ambulatory Visit (INDEPENDENT_AMBULATORY_CARE_PROVIDER_SITE_OTHER): Payer: Self-pay

## 2017-11-20 ENCOUNTER — Ambulatory Visit (INDEPENDENT_AMBULATORY_CARE_PROVIDER_SITE_OTHER): Payer: BLUE CROSS/BLUE SHIELD | Admitting: Physical Medicine and Rehabilitation

## 2017-11-20 ENCOUNTER — Encounter (INDEPENDENT_AMBULATORY_CARE_PROVIDER_SITE_OTHER): Payer: Self-pay | Admitting: Physical Medicine and Rehabilitation

## 2017-11-20 VITALS — BP 117/63 | HR 66

## 2017-11-20 DIAGNOSIS — M5441 Lumbago with sciatica, right side: Secondary | ICD-10-CM

## 2017-11-20 DIAGNOSIS — G8929 Other chronic pain: Secondary | ICD-10-CM

## 2017-11-20 DIAGNOSIS — M5442 Lumbago with sciatica, left side: Secondary | ICD-10-CM

## 2017-11-20 DIAGNOSIS — M47816 Spondylosis without myelopathy or radiculopathy, lumbar region: Secondary | ICD-10-CM | POA: Diagnosis not present

## 2017-11-20 MED ORDER — METHYLPREDNISOLONE ACETATE 80 MG/ML IJ SUSP
80.0000 mg | Freq: Once | INTRAMUSCULAR | Status: AC
  Administered 2017-11-20: 80 mg

## 2017-11-20 NOTE — Progress Notes (Signed)
 .  Numeric Pain Rating Scale and Functional Assessment Average Pain 8   In the last MONTH (on 0-10 scale) has pain interfered with the following?  1. General activity like being  able to carry out your everyday physical activities such as walking, climbing stairs, carrying groceries, or moving a chair?  Rating(5)   +Driver, -BT, -Dye Allergies.  

## 2017-11-20 NOTE — Patient Instructions (Signed)

## 2017-11-28 NOTE — Procedures (Signed)
Lumbar Facet Joint Intra-Articular Injection(s) with Fluoroscopic Guidance  [name]      Date of Birth: May 16, 1960 MRN: [id number] PCP: Girtha Rm, NP-C      Visit Date: 11/20/2017   Universal Protocol:    Date/Time: 11/20/2017  Consent Given By: the patient  Position: PRONE   Additional Comments: Vital signs were monitored before and after the procedure. Patient was prepped and draped in the usual sterile fashion. The correct patient, procedure, and site was verified.   Injection Procedure Details:  Procedure Site One Meds Administered:  Meds ordered this encounter  Medications  . methylPREDNISolone acetate (DEPO-MEDROL) injection 80 mg     Laterality: Bilateral  Location/Site:  L5-S1  Needle size: 22 guage  Needle type: Spinal  Needle Placement: Articular  Findings:  -Comments: Excellent flow of contrast producing a partial arthrogram.  Procedure Details: The fluoroscope beam is vertically oriented in AP, and the inferior recess is visualized beneath the lower pole of the inferior apophyseal process, which represents the target point for needle insertion. When direct visualization is difficult the target point is located at the medial projection of the vertebral pedicle. The region overlying each aforementioned target is locally anesthetized with a 1 to 2 ml. volume of 1% Lidocaine without Epinephrine.   The spinal needle was inserted into each of the above mentioned facet joints using biplanar fluoroscopic guidance. A 0.25 to 0.5 ml. volume of Isovue-250 was injected and a partial facet joint arthrogram was obtained. A single spot film was obtained of the resulting arthrogram.    One to 1.25 ml of the steroid/anesthetic solution was then injected into each of the facet joints noted above.   Additional Comments:  The patient tolerated the procedure well Dressing: Band-Aid    Post-procedure details: Patient was observed during the  procedure. Post-procedure instructions were reviewed.  Patient left the clinic in stable condition.

## 2017-11-28 NOTE — Progress Notes (Signed)
Amy Amy [name] - 57 y.o. female MRN [id number]  Date of birth: Dec 30, 1960  Office Visit Note: Visit Date: 11/20/2017 PCP: Amy Rm, NP-C Referred by: Amy Rm, NP-C  Subjective: Chief Complaint  Amy [name] presents with  . Lower Back - Pain  . Right Leg - Pain  . Left Leg - Pain   HPI: Amy Amy [name] is a 57 year old female followed by Dr. Jodi [name] in our practice.  She has been having chronic worsening low back pain and bilateral hip and leg pain.  Her back pain is much more problematic than Amy hip and leg pain but she does have both.  She had failed conservative care including medication management time and physical therapy.  MRI of Amy lumbar spine was obtained and is reviewed below and reviewed with Amy Amy [name].  Her biggest finding is facet arthropathy significant and severe at L5-S1 with foraminal narrowing and lateral recess narrowing also quite severe.  We are going to start with bilateral facet joint blocks because her back pain really is Amy more problematic feature and is worse with facet joint loading and standing.  She could be a candidate for ablation.  Depending on relief would look at transforaminal injection either at L5 or S1 bilaterally.  Unfortunately may be a candidate for surgical decompression but unfortunately would probably be a fusion at this level.  Hopefully will get her some relief that she can live with.  Average pain is 8 out of 10.  She been having this for about 2 years.  Standing really worsens Amy symptoms a great deal.   ROS Otherwise per HPI.  Assessment & Plan: Visit Diagnoses:  1. Spondylosis without myelopathy or radiculopathy, lumbar region   2. Chronic bilateral low back pain with bilateral sciatica     Plan: No additional findings.   Meds & Orders:  Meds ordered this encounter  Medications  . methylPREDNISolone acetate (DEPO-MEDROL) injection 80 mg    Orders Placed This Encounter  Procedures  . Facet Injection  . XR C-ARM  NO REPORT    Follow-up: Return if symptoms worsen or fail to improve.   Procedures: No procedures performed  Lumbar Facet Joint Intra-Articular Injection(s) with Fluoroscopic Guidance  Amy [name]: Amy Amy [name]      Date of Birth: 07-21-1960 MRN: [id number] PCP: Amy Rm, NP-C      Visit Date: 11/20/2017   Universal Protocol:    Date/Time: 11/20/2017  Consent Given By: Amy Amy [name]  Position: PRONE   Additional Comments: Vital signs were monitored before and after Amy procedure. Amy [name] was prepped and draped in Amy usual sterile fashion. Amy correct Amy [name], procedure, and site was verified.   Injection Procedure Details:  Procedure Site One Meds Administered:  Meds ordered this encounter  Medications  . methylPREDNISolone acetate (DEPO-MEDROL) injection 80 mg     Laterality: Bilateral  Location/Site:  L5-S1  Needle size: 22 guage  Needle type: Spinal  Needle Placement: Articular  Findings:  -Comments: Excellent flow of contrast producing a partial arthrogram.  Procedure Details: Amy fluoroscope beam is vertically oriented in AP, and Amy inferior recess is visualized beneath Amy lower pole of Amy inferior apophyseal process, which represents Amy target point for needle insertion. When direct visualization is difficult Amy target point is located at Amy medial projection of Amy vertebral pedicle. Amy region overlying each aforementioned target is locally anesthetized with a 1 to 2 ml. volume of 1% Lidocaine without Epinephrine.   Amy spinal needle was inserted into each  of Amy above mentioned facet joints using biplanar fluoroscopic guidance. A 0.25 to 0.5 ml. volume of Isovue-250 was injected and a partial facet joint arthrogram was obtained. A single spot film was obtained of Amy resulting arthrogram.    One to 1.25 ml of Amy steroid/anesthetic solution was then injected into each of Amy facet joints noted above.   Additional Comments:  Amy Amy [name] tolerated  Amy procedure well Dressing: Band-Aid    Post-procedure details: Amy [name] was observed during Amy procedure. Post-procedure instructions were reviewed.  Amy [name] left Amy clinic in stable condition.    Clinical History: MRI LUMBAR SPINE WITHOUT CONTRAST  TECHNIQUE: Multiplanar, multisequence MR imaging of Amy lumbar spine was performed. No intravenous contrast was administered.  COMPARISON:  None.  FINDINGS: Segmentation:  Normal  Alignment:  Normal  Vertebrae:  Normal bone marrow.  Negative for fracture or mass.  Conus medullaris and cauda equina: Conus extends to Amy L1-2 level. Conus and cauda equina appear normal.  Paraspinal and other soft tissues: Negative for paraspinous mass or fluid collection  Disc levels:  L1-2: Negative  L2-3: Negative  L3-4: Mild disc and facet degeneration without significant stenosis  L4-5: Mild disc and mild facet degeneration. Mild subarticular stenosis bilaterally  L5-S1: Severe facet degeneration bilaterally with facet joint effusions and diffuse facet hypertrophy. Mild disc degeneration. Severe subarticular and foraminal stenosis with bilateral L5 and S1 nerve root impingement.  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  Mild degenerative change L3-4 and L4-5.   Electronically Signed   By: Franchot Gallo M.D.   On: 10/15/2017 10:28   She reports that she has never smoked. She has never used smokeless tobacco. No results for input(s): HGBA1C, LABURIC in Amy last 8760 hours.  Objective:  VS:  HT:    WT:   BMI:     BP:117/63  HR:66bpm  TEMP: ( )  RESP:  Physical Exam  Ortho Exam Imaging: No results found.  Past Medical/Family/Surgical/Social History: Medications & Allergies reviewed per EMR, new medications updated. Amy [name] Active Problem List   Diagnosis Date Noted  . Chronic bilateral low back pain without sciatica 09/20/2017   . ABDOMINAL BLOATING 03/23/2010   Past Medical History:  Diagnosis Date  . GERD (gastroesophageal reflux disease)    History reviewed. No pertinent family history. History reviewed. No pertinent surgical history. Social History   Occupational History  . Not on file  Tobacco Use  . Smoking status: Never Smoker  . Smokeless tobacco: Never Used  Substance and Sexual Activity  . Alcohol use: No  . Drug use: No  . Sexual activity: Not on file

## 2017-12-06 ENCOUNTER — Ambulatory Visit (INDEPENDENT_AMBULATORY_CARE_PROVIDER_SITE_OTHER): Payer: BLUE CROSS/BLUE SHIELD | Admitting: Orthopedic Surgery

## 2017-12-06 ENCOUNTER — Encounter (INDEPENDENT_AMBULATORY_CARE_PROVIDER_SITE_OTHER): Payer: Self-pay | Admitting: Orthopedic Surgery

## 2017-12-06 DIAGNOSIS — M1711 Unilateral primary osteoarthritis, right knee: Secondary | ICD-10-CM

## 2017-12-06 NOTE — Progress Notes (Signed)
   Office Visit Note   [name]           Date of Birth: 12-18-1960           MRN: [id number] Visit Date: 12/06/2017 Requested by: Girtha Rm, NP-C [place], [place] [address] PCP: Girtha Rm, NP-C  Subjective: Chief Complaint  Patient presents with  . Right Knee - Pain    HPI: Patient presents with right knee pain.  She had cortisone injection 11/01/2017.  She reports some weakness and pain with weightbearing all localizing to that medial side.  She does take work which requires a lot of standing.  She is also getting injections in her back.              ROS: All systems reviewed are negative as they relate to the chief complaint within the history of present illness.  Patient denies  fevers or chills.   Assessment & Plan: Visit Diagnoses:  1. Unilateral primary osteoarthritis, right knee     Plan: Impression is right knee pain with normal radiographs and marginal response to injection.  I think MRI scanning is the next logical step to work this up.  I think she may have a degenerative meniscal tear.  Plan at this time is for observation.  She wants to hold off on the MRI scan.  She will call us directly when she wants to get that scheduled.  For now continue with anti-inflammatories and quad strengthening exercises.  Follow-up as needed  Follow-Up Instructions: Return if symptoms worsen or fail to improve.   Orders:  No orders of the defined types were placed in this encounter.  No orders of the defined types were placed in this encounter.     Procedures: No procedures performed   Clinical Data: No additional findings.  Objective: Vital Signs: There were no vitals taken for this visit.  Physical Exam:   Constitutional: Patient appears well-developed HEENT:  Head: Normocephalic Eyes:EOM are normal Neck: Normal range of motion Cardiovascular: Normal rate Pulmonary/chest: Effort normal Neurologic: Patient is alert Skin:  Skin is warm Psychiatric: Patient has normal mood and affect    Ortho Exam: Ortho exam demonstrates no effusion in the right knee but there is medial joint line tenderness.  Extensor mechanism is intact.  Collateral and cruciate ligaments are stable.  No other masses lymphadenopathy or skin changes noted in the right knee region  Specialty Comments:  No specialty comments available.  Imaging: No results found.   PMFS History: Patient Active Problem List   Diagnosis Date Noted  . Chronic bilateral low back pain without sciatica 09/20/2017  . ABDOMINAL BLOATING 03/23/2010   Past Medical History:  Diagnosis Date  . GERD (gastroesophageal reflux disease)     History reviewed. No pertinent family history.  History reviewed. No pertinent surgical history. Social History   Occupational History  . Not on file  Tobacco Use  . Smoking status: Never Smoker  . Smokeless tobacco: Never Used  Substance and Sexual Activity  . Alcohol use: No  . Drug use: No  . Sexual activity: Not on file

## 2017-12-20 DIAGNOSIS — H401132 Primary open-angle glaucoma, bilateral, moderate stage: Secondary | ICD-10-CM | POA: Diagnosis not present

## 2018-01-03 ENCOUNTER — Ambulatory Visit (INDEPENDENT_AMBULATORY_CARE_PROVIDER_SITE_OTHER): Payer: BLUE CROSS/BLUE SHIELD | Admitting: Family Medicine

## 2018-01-03 ENCOUNTER — Encounter: Payer: Self-pay | Admitting: Family Medicine

## 2018-01-03 VITALS — BP 110/72 | HR 59 | Temp 97.9°F | Ht 62.5 in | Wt 173.8 lb

## 2018-01-03 DIAGNOSIS — I10 Essential (primary) hypertension: Secondary | ICD-10-CM

## 2018-01-03 DIAGNOSIS — Z1159 Encounter for screening for other viral diseases: Secondary | ICD-10-CM

## 2018-01-03 DIAGNOSIS — Z0000 Encounter for general adult medical examination without abnormal findings: Secondary | ICD-10-CM | POA: Diagnosis not present

## 2018-01-03 DIAGNOSIS — R7303 Prediabetes: Secondary | ICD-10-CM | POA: Insufficient documentation

## 2018-01-03 DIAGNOSIS — K219 Gastro-esophageal reflux disease without esophagitis: Secondary | ICD-10-CM | POA: Diagnosis not present

## 2018-01-03 LAB — POCT URINALYSIS DIP (PROADVANTAGE DEVICE)
BILIRUBIN UA: NEGATIVE
BILIRUBIN UA: NEGATIVE mg/dL
GLUCOSE UA: NEGATIVE mg/dL
Leukocytes, UA: NEGATIVE
NITRITE UA: NEGATIVE
Protein Ur, POC: NEGATIVE mg/dL
RBC UA: NEGATIVE
SPECIFIC GRAVITY, URINE: 1.015
Urobilinogen, Ur: NEGATIVE
pH, UA: 7 (ref 5.0–8.0)

## 2018-01-03 NOTE — Patient Instructions (Addendum)
It was a pleasure seeing you today.  Your blood pressure today is 110/72 and in normal range.   Stop taking the Zantac for now. Try something else such as Pepcid or Tums.   Ask your son to help you with any immunizations that you may need. Specifically Tdap (Tetanus, diptheria, and pertussis) or Shingrix (shingles prevention). You should check with your insurance in regards to your financial responsibility regarding Shingrix before having this vaccine.  If you change your mind about the flu shot you can let me know.   I will attempt to get your medical records from [facility] and colonoscopy).   We will call with your lab results.   Preventative Care for Adults - Female      MAINTAIN REGULAR HEALTH EXAMS:  A routine yearly physical is a good way to check in with your primary care provider about your health and preventive screening. It is also an opportunity to share updates about your health and any concerns you have, and receive a thorough all-over exam.   Most health insurance companies pay for at least some preventative services.  Check with your health plan for specific coverages.  WHAT PREVENTATIVE SERVICES DO WOMEN NEED?  Adult women should have their weight and blood pressure checked regularly.   Women age 58 and older should have their cholesterol levels checked regularly.  Women should be screened for cervical cancer with a Pap smear and pelvic exam beginning at either age 53, or 3 years after they become sexually activity.    Breast cancer screening generally begins at age 103 with a mammogram and breast exam by your primary care provider.    Beginning at age 69 and continuing to age 71, women should be screened for colorectal cancer.  Certain people may need continued testing until age 97.  Updating vaccinations is part of preventative care.  Vaccinations help protect against diseases such as the flu.  Osteoporosis is a disease in which the bones lose minerals and  strength as we age. Women ages 46 and over should discuss this with their caregivers, as should women after menopause who have other risk factors.  Lab tests are generally done as part of preventative care to screen for anemia and blood disorders, to screen for problems with the kidneys and liver, to screen for bladder problems, to check blood sugar, and to check your cholesterol level.  Preventative services generally include counseling about diet, exercise, avoiding tobacco, drugs, excessive alcohol consumption, and sexually transmitted infections.    GENERAL RECOMMENDATIONS FOR GOOD HEALTH:  Healthy diet:  Eat a variety of foods, including fruit, vegetables, animal or vegetable protein, such as meat, fish, chicken, and eggs, or beans, lentils, tofu, and grains, such as rice.  Drink plenty of water daily.  Decrease saturated fat in the diet, avoid lots of red meat, processed foods, sweets, fast foods, and fried foods.  Exercise:  Aerobic exercise helps maintain good heart health. At least 30-40 minutes of moderate-intensity exercise is recommended. For example, a brisk walk that increases your heart rate and breathing. This should be done on most days of the week.   Find a type of exercise or a variety of exercises that you enjoy so that it becomes a part of your daily life.  Examples are running, walking, swimming, water aerobics, and biking.  For motivation and support, explore group exercise such as aerobic class, spin class, Zumba, Yoga,or  martial arts, etc.    Set exercise goals for yourself, such  as a certain weight goal, walk or run in a race such as a 5k walk/run.  Speak to your primary care provider about exercise goals.  Disease prevention:  If you smoke or chew tobacco, find out from your caregiver how to quit. It can literally save your life, no matter how long you have been a tobacco user. If you do not use tobacco, never begin.   Maintain a healthy diet and normal weight.  Increased weight leads to problems with blood pressure and diabetes.   The Body Mass Index or BMI is a way of measuring how much of your body is fat. Having a BMI above 27 increases the risk of heart disease, diabetes, hypertension, stroke and other problems related to obesity. Your caregiver can help determine your BMI and based on it develop an exercise and dietary program to help you achieve or maintain this important measurement at a healthful level.  High blood pressure causes heart and blood vessel problems.  Persistent high blood pressure should be treated with medicine if weight loss and exercise do not work.   Fat and cholesterol leaves deposits in your arteries that can block them. This causes heart disease and vessel disease elsewhere in your body.  If your cholesterol is found to be high, or if you have heart disease or certain other medical conditions, then you may need to have your cholesterol monitored frequently and be treated with medication.   Ask if you should have a cardiac stress test if your history suggests this. A stress test is a test done on a treadmill that looks for heart disease. This test can find disease prior to there being a problem.  Menopause can be associated with physical symptoms and risks. Hormone replacement therapy is available to decrease these. You should talk to your caregiver about whether starting or continuing to take hormones is right for you.   Osteoporosis is a disease in which the bones lose minerals and strength as we age. This can result in serious bone fractures. Risk of osteoporosis can be identified using a bone density scan. Women ages 60 and over should discuss this with their caregivers, as should women after menopause who have other risk factors. Ask your caregiver whether you should be taking a calcium supplement and Vitamin D, to reduce the rate of osteoporosis.   Avoid drinking alcohol in excess (more than two drinks per day).  Avoid use of  street drugs. Do not share needles with anyone. Ask for professional help if you need assistance or instructions on stopping the use of alcohol, cigarettes, and/or drugs.  Brush your teeth twice a day with fluoride toothpaste, and floss once a day. Good oral hygiene prevents tooth decay and gum disease. The problems can be painful, unattractive, and can cause other health problems. Visit your dentist for a routine oral and dental check up and preventive care every 6-12 months.   Look at your skin regularly.  Use a mirror to look at your back. Notify your caregivers of changes in moles, especially if there are changes in shapes, colors, a size larger than a pencil eraser, an irregular border, or development of new moles.  Safety:  Use seatbelts 100% of the time, whether driving or as a passenger.  Use safety devices such as hearing protection if you work in environments with loud noise or significant background noise.  Use safety glasses when doing any work that could send debris in to the eyes.  Use a helmet if  you ride a bike or motorcycle.  Use appropriate safety gear for contact sports.  Talk to your caregiver about gun safety.  Use sunscreen with a SPF (or skin protection factor) of 15 or greater.  Lighter skinned people are at a greater risk of skin cancer. Don't forget to also wear sunglasses in order to protect your eyes from too much damaging sunlight. Damaging sunlight can accelerate cataract formation.   Practice safe sex. Use condoms. Condoms are used for birth control and to help reduce the spread of sexually transmitted infections (or STIs).  Some of the STIs are gonorrhea (the clap), chlamydia, syphilis, trichomonas, herpes, HPV (human papilloma virus) and HIV (human immunodeficiency virus) which causes AIDS. The herpes, HIV and HPV are viral illnesses that have no cure. These can result in disability, cancer and death.   Keep carbon monoxide and smoke detectors in your home functioning  at all times. Change the batteries every 6 months or use a model that plugs into the wall.   Vaccinations:  Stay up to date with your tetanus shots and other required immunizations. You should have a booster for tetanus every 10 years. Be sure to get your flu shot every year, since 5%-20% of the [place] population comes down with the flu. The flu vaccine changes each year, so being vaccinated once is not enough. Get your shot in the fall, before the flu season peaks.   Other vaccines to consider:  Human Papilloma Virus or HPV causes cancer of the cervix, and other infections that can be transmitted from person to person. There is a vaccine for HPV, and females should get immunized between the ages of 82 and 24. It requires a series of 3 shots.   Pneumococcal vaccine to protect against certain types of pneumonia.  This is normally recommended for adults age 29 or older.  However, adults younger than 56 years old with certain underlying conditions such as diabetes, heart or lung disease should also receive the vaccine.  Shingles vaccine to protect against Varicella Zoster if you are older than age 16, or younger than 56 years old with certain underlying illness.  Hepatitis A vaccine to protect against a form of infection of the liver by a virus acquired from food.  Hepatitis B vaccine to protect against a form of infection of the liver by a virus acquired from blood or body fluids, particularly if you work in health care.  If you plan to travel internationally, check with your local health department for specific vaccination recommendations.  Cancer Screening:  Breast cancer screening is essential to preventive care for women. All women age 67 and older should perform a breast self-exam every month. At age 85 and older, women should have their caregiver complete a breast exam each year. Women at ages 13 and older should have a mammogram (x-ray film) of the breasts. Your caregiver can discuss how  often you need mammograms.    Cervical cancer screening includes taking a Pap smear (sample of cells examined under a microscope) from the cervix (end of the uterus). It also includes testing for HPV (Human Papilloma Virus, which can cause cervical cancer). Screening and a pelvic exam should begin at age 54, or 3 years after a woman becomes sexually active. Screening should occur every year, with a Pap smear but no HPV testing, up to age 43. After age 23, you should have a Pap smear every 3 years with HPV testing, if no HPV was found previously.   Most  routine colon cancer screening begins at the age of 4. On a yearly basis, doctors may provide special easy to use take-home tests to check for hidden blood in the stool. Sigmoidoscopy or colonoscopy can detect the earliest forms of colon cancer and is life saving. These tests use a small camera at the end of a tube to directly examine the colon. Speak to your caregiver about this at age 56, when routine screening begins (and is repeated every 5 years unless early forms of pre-cancerous polyps or small growths are found).

## 2018-01-03 NOTE — Progress Notes (Signed)
Subjective:    Patient ID: Amy [name], female    DOB: 1960/09/04, 57 y.o.   MRN: [id number]  HPI Chief Complaint  Patient presents with  . Other    CPE fasting   She is fairly new to the practice and here for a complete physical exam.  She is from [place] originally. Previous care was with Dr. Melvern Sample She has also had care in [place] including most recent colonoscopy and mammogram.   Other providers: OB/GYN - Dr. Carlota Raspberry  Eye doctor- Dr. Venetia Maxon Zuehl Smiles  Orthopedist- Dr. Marlou Sa   HTN-states she was diagnosed over 2 years ago. Checks BP at home. States readings are normal.  Reports taking amlodipine and HCTZ- states she is taking these only when her blood pressures are elevated. Reports taking these 3-4 days per week. States she needs refill of amlodipine.   Prediabetes- last A1c 5.9% per patient. Highest in past 6.3%   Glaucoma - sees her eye doctor.   GERD- some days. Has taken Zantac and omeprazole.   Social history: Lives with her family. States she has a son who is 64 and 3 daughters.  single, works in a Teacher, adult education for [employer] Denies smoking, drinking alcohol, drug use  Diet: healthy  Excerise: nothing lately   Immunizations: unknkown   Health maintenance:  Mammogram: last year in [place]. Normal.  Colonoscopy: done in [place]  Last Gynecological Exam: normal pap smear in 2018.  Last Menstrual cycle: 6 months ago  Last Dental Exam: appointment this afternoon Last Eye Exam:  Last week   Wears seatbelt always, smoke detectors in home and functioning, does not text while driving and feels safe in home environment.   Reviewed allergies, medications, past medical, surgical, family, and social history.   Review of Systems Review of Systems Constitutional: -fever, -chills, -sweats, -unexpected weight change,-fatigue ENT: -runny nose, -ear pain, -sore throat Cardiology:  -chest pain, -palpitations, -edema Respiratory: -cough,  -shortness of breath, -wheezing Gastroenterology: -abdominal pain, -nausea, -vomiting, -diarrhea, -constipation  Hematology: -bleeding or bruising problems Musculoskeletal: -arthralgias, -myalgias, -joint swelling, -back pain Ophthalmology: -vision changes Urology: -dysuria, -difficulty urinating, -hematuria, -urinary frequency, -urgency Neurology: -headache, -weakness, -tingling, -numbness       Objective:   Physical Exam BP 110/72 (BP Location: Left Arm, Patient Position: Sitting)   Pulse (!) 59   Temp 97.9 F (36.6 C)   Ht 5' 2.5" (1.588 m)   Wt 173 lb 12.8 oz (78.8 kg)   SpO2 97%   BMI 31.28 kg/m   General Appearance:    Alert, cooperative, no distress, appears stated age  Head:    Normocephalic, without obvious abnormality, atraumatic  Eyes:    PERRL, conjunctiva/corneas clear, EOM's intact, fundi    benign  Ears:    Normal TM's and external ear canals  Nose:   Nares normal, mucosa normal, no drainage or sinus   tenderness  Throat:   Lips, mucosa, and tongue normal; teeth and gums normal  Neck:   Supple, no lymphadenopathy;  thyroid:  no   enlargement/tenderness/nodules; no carotid   bruit or JVD  Back:    Spine nontender, no curvature, ROM normal, no CVA     tenderness  Lungs:     Clear to auscultation bilaterally without wheezes, rales or     ronchi; respirations unlabored  Chest Wall:    No tenderness or deformity   Heart:    Regular rate and rhythm, S1 and S2 normal, no murmur, rub   or gallop  Breast Exam:    No tenderness, masses, or nipple discharge or inversion.      No axillary lymphadenopathy  Abdomen:     Soft, non-tender, nondistended, normoactive bowel sounds,    no masses, no hepatosplenomegaly  Genitalia:    Declines. Pap smear UTD.      Extremities:   No clubbing, cyanosis or edema  Pulses:   2+ and symmetric all extremities  Skin:   Skin color, texture, turgor normal, no rashes or lesions  Lymph nodes:   Cervical, supraclavicular, and axillary nodes  normal  Neurologic:   CNII-XII intact, normal strength, sensation and gait; reflexes 2+ and symmetric throughout          Psych:   Normal mood, affect, hygiene and grooming.     Urinalysis dipstick: negative      Assessment & Plan:  Routine general medical examination at a health care facility - Plan: POCT Urinalysis DIP (Proadvantage Device), CBC with Differential/Platelet, Comprehensive metabolic panel, TSH, T4, free, Lipid panel  Prediabetes - Plan: TSH, T4, free, Hemoglobin A1c  Gastroesophageal reflux disease, esophagitis presence not specified  Essential hypertension - Plan: CBC with Differential/Platelet, Comprehensive metabolic panel  Need for hepatitis C screening test - Plan: Hepatitis C antibody  She appears to be doing well and in good spirits.  HTN- BP appears to be well controlled. discussed taking medication daily as opposed to only on days when BP is elevated. Counseling on healthy diet and activity level. Refill amlodipine.  Prediabetes- reports previous Hgb A1c 5.9%. No medications. No medical records available today. Will request these. Check Hgb A1c today.  GERD- recommend she stop taking Zantac due to recent concerns over contamination. Try Tums or Pepcid. She does not want to take PPIs. Discussed lifestyle management for control.  Screening for hepatitis C done.  Immunizations discussed and she will try to get records. Let us know if she is due.  Up to date on health maintenance per patient. Will get records for mammogram and colonoscopy.  Check labs including fasting lipids and follow up.

## 2018-01-04 LAB — LIPID PANEL
CHOL/HDL RATIO: 2.3 ratio (ref 0.0–4.4)
Cholesterol, Total: 99 mg/dL — ABNORMAL LOW (ref 100–199)
HDL: 44 mg/dL (ref >39)
LDL Calculated: 46 mg/dL (ref 0–99)
Triglycerides: 44 mg/dL (ref 0–149)
VLDL Cholesterol Cal: 9 mg/dL (ref 5–40)

## 2018-01-04 LAB — COMPREHENSIVE METABOLIC PANEL
A/G RATIO: 1.5 (ref 1.2–2.2)
ALT: 23 IU/L (ref 0–32)
AST: 16 IU/L (ref 0–40)
Albumin: 4.1 g/dL (ref 3.5–5.5)
Alkaline Phosphatase: 76 IU/L (ref 39–117)
BILIRUBIN TOTAL: 0.4 mg/dL (ref 0.0–1.2)
BUN/Creatinine Ratio: 17 (ref 9–23)
BUN: 14 mg/dL (ref 6–24)
CALCIUM: 9.6 mg/dL (ref 8.7–10.2)
CHLORIDE: 104 mmol/L (ref 96–106)
CO2: 27 mmol/L (ref 20–29)
Creatinine, Ser: 0.83 mg/dL (ref 0.57–1.00)
GFR, EST AFRICAN AMERICAN: 91 mL/min/{1.73_m2} (ref >59)
GFR, EST NON AFRICAN AMERICAN: 79 mL/min/{1.73_m2} (ref >59)
GLOBULIN, TOTAL: 2.8 g/dL (ref 1.5–4.5)
Glucose: 108 mg/dL — ABNORMAL HIGH (ref 65–99)
POTASSIUM: 4.3 mmol/L (ref 3.5–5.2)
SODIUM: 143 mmol/L (ref 134–144)
TOTAL PROTEIN: 6.9 g/dL (ref 6.0–8.5)

## 2018-01-04 LAB — CBC WITH DIFFERENTIAL/PLATELET
BASOS: 0 %
Basophils Absolute: 0 10*3/uL (ref 0.0–0.2)
EOS (ABSOLUTE): 0.2 10*3/uL (ref 0.0–0.4)
EOS: 4 %
HEMATOCRIT: 41.4 % (ref 34.0–46.6)
Hemoglobin: 13.7 g/dL (ref 11.1–15.9)
IMMATURE GRANS (ABS): 0 10*3/uL (ref 0.0–0.1)
IMMATURE GRANULOCYTES: 0 %
Lymphocytes Absolute: 1.9 10*3/uL (ref 0.7–3.1)
Lymphs: 41 %
MCH: 30.4 pg (ref 26.6–33.0)
MCHC: 33.1 g/dL (ref 31.5–35.7)
MCV: 92 fL (ref 79–97)
Monocytes Absolute: 0.4 10*3/uL (ref 0.1–0.9)
Monocytes: 9 %
NEUTROS ABS: 2.1 10*3/uL (ref 1.4–7.0)
NEUTROS PCT: 46 %
Platelets: 326 10*3/uL (ref 150–450)
RBC: 4.51 x10E6/uL (ref 3.77–5.28)
RDW: 11.8 % — ABNORMAL LOW (ref 12.3–15.4)
WBC: 4.6 10*3/uL (ref 3.4–10.8)

## 2018-01-04 LAB — HEPATITIS C ANTIBODY

## 2018-01-04 LAB — TSH: TSH: 1.34 u[IU]/mL (ref 0.450–4.500)

## 2018-01-04 LAB — HEMOGLOBIN A1C
ESTIMATED AVERAGE GLUCOSE: 131 mg/dL
HEMOGLOBIN A1C: 6.2 % — AB (ref 4.8–5.6)

## 2018-01-04 LAB — T4, FREE: Free T4: 1.02 ng/dL (ref 0.82–1.77)

## 2018-01-05 ENCOUNTER — Telehealth: Payer: Self-pay | Admitting: Family Medicine

## 2018-01-05 NOTE — Telephone Encounter (Signed)
Received requested records from [facility]. Sending back for review.

## 2018-01-15 ENCOUNTER — Encounter: Payer: Self-pay | Admitting: Family Medicine

## 2018-01-15 DIAGNOSIS — E559 Vitamin D deficiency, unspecified: Secondary | ICD-10-CM | POA: Insufficient documentation

## 2018-01-15 HISTORY — DX: Vitamin D deficiency, unspecified: E55.9

## 2018-01-22 ENCOUNTER — Telehealth: Payer: Self-pay | Admitting: Family Medicine

## 2018-01-22 NOTE — Telephone Encounter (Signed)
Received requested records from [facility]. Sending back for review.

## 2018-01-26 ENCOUNTER — Other Ambulatory Visit: Payer: Self-pay | Admitting: Family Medicine

## 2018-01-29 ENCOUNTER — Encounter: Payer: Self-pay | Admitting: Family Medicine

## 2018-02-15 ENCOUNTER — Encounter: Payer: Self-pay | Admitting: Family Medicine

## 2018-02-22 ENCOUNTER — Telehealth: Payer: Self-pay | Admitting: Family Medicine

## 2018-02-22 MED ORDER — AMLODIPINE BESYLATE 10 MG PO TABS: 10.0000 mg | ORAL_TABLET | Freq: Every day | ORAL | 5 refills | Status: DC

## 2018-02-22 MED ORDER — HYDROCHLOROTHIAZIDE 12.5 MG PO CAPS: 12.5000 mg | ORAL_CAPSULE | Freq: Every day | ORAL | 5 refills | Status: DC

## 2018-02-22 NOTE — Telephone Encounter (Signed)
Meds have been sent in. Pt notified

## 2018-02-22 NOTE — Telephone Encounter (Signed)
Pt called and is requesting a refill on her amlodipine and her hydroclorothiazide pt uses [facility], [address] pt would like a call after they are sent in pt can be reached at [phone]

## 2018-09-12 ENCOUNTER — Other Ambulatory Visit: Payer: Self-pay | Admitting: Family Medicine

## 2018-11-06 ENCOUNTER — Other Ambulatory Visit: Payer: Self-pay | Admitting: Family Medicine

## 2018-11-14 ENCOUNTER — Ambulatory Visit (INDEPENDENT_AMBULATORY_CARE_PROVIDER_SITE_OTHER): Payer: BLUE CROSS/BLUE SHIELD | Admitting: Orthopedic Surgery

## 2018-11-14 ENCOUNTER — Ambulatory Visit: Payer: Self-pay

## 2018-11-14 ENCOUNTER — Encounter: Payer: Self-pay | Admitting: Orthopedic Surgery

## 2018-11-14 ENCOUNTER — Other Ambulatory Visit: Payer: Self-pay

## 2018-11-14 DIAGNOSIS — M654 Radial styloid tenosynovitis [de Quervain]: Secondary | ICD-10-CM

## 2018-11-14 DIAGNOSIS — M25531 Pain in right wrist: Secondary | ICD-10-CM | POA: Diagnosis not present

## 2018-11-14 NOTE — Progress Notes (Signed)
Office Visit Note   [name]           Date of Birth: 02/20/1961           MRN: [id number] Visit Date: 11/14/2018 Requested by: Girtha Rm, NP-C [place],  [place] [address] PCP: Girtha Rm, NP-C  Subjective: Chief Complaint  Patient presents with  . Right Wrist - Pain  . Lower Back - Pain    HPI: Amy [name] is a 58 y.o. female who presents to the office complaining of right wrist and lumbar back pain.  Patient is right-hand dominant female who notes right wrist pain since March 2020.  She denies any acute injury.  The pain wakes her up at night occasionally.  She localizes the pain to the radial side of the wrist.  She finds it difficult when flexing and extending the wrist.  She has tried topical Voltaren gel and a thumb spica brace without any relief.  Patient notes numbness and tingling in her hand through all 5 fingers both dorsally and palmarly.  She denies any neck pain.  Patient also complains of lumbar back pain.  She denies any acute injury.  She localizes most of her pain to the lumbar spine with radicular leg pain that reaches her toes bilaterally.  She notes numbness and tingling.  Her symptoms are worse with coughing or sneezing.  Her last MRI of the lumbar spine was last year.  She received ESI's from Dr. Ernestina Patches in August 2019 that provided her relief for about a month.  She is also using an LSO with some improvement in her symptoms.              ROS:  All systems reviewed are negative as they relate to the chief complaint within the history of present illness.  Patient denies fevers or chills.  Assessment & Plan: Visit Diagnoses:  1. Pain in right wrist     Plan: Patient is a 58 year old female who presents complaining of right radial wrist pain and lumbar back pain with radicular symptoms.  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  Discussed options available to the patient regarding her back pain and leg symptoms.  Discussed the use of anti-inflammatories versus physical therapy versus ESI's versus surgery.  Patient has had some relief with ESI's in the past, but if she gets a little relief with this episode and she will likely require surgery.  As for her right wrist pain, impression is de Quervain's tenosynovitis.  Explained the cause of this pain and the potential treatments we can explore.  She has tried Voltaren gel and a thumb spica brace, so the next options are an injection or surgical release.  Discussed the risks of each option including the risk of hypopigmentation with the injection.  Discussed all these options with the patient and her son who is on the phone.  Patient will call the office with her decision regarding further treatment.  Follow-Up Instructions: No follow-ups on file.   Orders:  Orders Placed This Encounter  Procedures  . XR Wrist 2 Views Right   No orders of the defined types were placed in this encounter.     Procedures: No procedures performed   Clinical Data: No additional findings.  Objective: Vital Signs: There were no vitals taken for this visit.  Physical Exam:  Constitutional: Patient appears  well-developed HEENT:  Head: Normocephalic Eyes:EOM are normal Neck: Normal range of motion Cardiovascular: Normal rate Pulmonary/chest: Effort normal Neurologic: Patient is alert Skin: Skin is warm Psychiatric: Patient has normal mood and affect  Ortho Exam:  Right wrist exam No TTP over the ulnar wrist or throughout any of the 5 digits.  No TTP over the scaphoid or 3-4 portal. Significant TTP over the first compartment Mild pain with thumb circumduction Positive Finkelstein's test Negative Tinel's  Spine exam 5/5 motor strength of hip flexors, quadriceps, dorsiflexion, plantarflexion bilaterally TTP throughout the lumbar spine.  Pain worse with  extension of the spine. Negative straight leg raise on right.  Positive straight leg raise on left. No pain with hip internal rotation    Specialty Comments:  No specialty comments available.  Imaging: No results found.   PMFS History: Patient Active Problem List   Diagnosis Date Noted  . Vitamin D deficiency 01/15/2018  . Prediabetes 01/03/2018  . Gastroesophageal reflux disease 01/03/2018  . Essential hypertension 01/03/2018  . Chronic bilateral low back pain without sciatica 09/20/2017  . ABDOMINAL BLOATING 03/23/2010   Past Medical History:  Diagnosis Date  . GERD (gastroesophageal reflux disease)   . Glaucoma   . Hypertension   . Prediabetes   . Vitamin D deficiency 01/15/2018    Family History  Problem Relation Age of Onset  . Hypertension Mother   . Hypertension Father   . Hypertension Sister   . Hypertension Brother     Past Surgical History:  Procedure Laterality Date  . CESAREAN SECTION     Social History   Occupational History  . Not on file  Tobacco Use  . Smoking status: Never Smoker  . Smokeless tobacco: Never Used  Substance and Sexual Activity  . Alcohol use: No  . Drug use: No  . Sexual activity: Not on file

## 2018-11-16 ENCOUNTER — Encounter: Payer: Self-pay | Admitting: Orthopedic Surgery

## 2018-11-21 ENCOUNTER — Telehealth: Payer: Self-pay | Admitting: Physical Medicine and Rehabilitation

## 2018-11-21 NOTE — Telephone Encounter (Signed)
If all yes then ok

## 2018-11-22 NOTE — Telephone Encounter (Signed)
Left message #1

## 2018-12-05 ENCOUNTER — Ambulatory Visit: Payer: BLUE CROSS/BLUE SHIELD | Admitting: Orthopedic Surgery

## 2018-12-13 ENCOUNTER — Encounter (HOSPITAL_COMMUNITY): Payer: Self-pay

## 2018-12-13 ENCOUNTER — Emergency Department (HOSPITAL_COMMUNITY)
Admission: EM | Admit: 2018-12-13 | Discharge: 2018-12-13 | Discharge status: Against Medical Advice | Payer: BC Managed Care – PPO | Attending: Internal Medicine | Admitting: Internal Medicine

## 2018-12-13 ENCOUNTER — Other Ambulatory Visit: Payer: Self-pay

## 2018-12-13 ENCOUNTER — Emergency Department (HOSPITAL_COMMUNITY): Payer: BC Managed Care – PPO

## 2018-12-13 ENCOUNTER — Ambulatory Visit (INDEPENDENT_AMBULATORY_CARE_PROVIDER_SITE_OTHER)
Admission: EM | Admit: 2018-12-13 | Discharge: 2018-12-13 | Disposition: A | Discharge status: Home / Self Care | Payer: BC Managed Care – PPO | Source: Home / Self Care | Attending: Internal Medicine | Admitting: Internal Medicine

## 2018-12-13 DIAGNOSIS — I10 Essential (primary) hypertension: Secondary | ICD-10-CM | POA: Diagnosis not present

## 2018-12-13 DIAGNOSIS — Z79899 Other long term (current) drug therapy: Secondary | ICD-10-CM | POA: Insufficient documentation

## 2018-12-13 DIAGNOSIS — R079 Chest pain, unspecified: Secondary | ICD-10-CM | POA: Diagnosis not present

## 2018-12-13 DIAGNOSIS — R0789 Other chest pain: Secondary | ICD-10-CM | POA: Insufficient documentation

## 2018-12-13 DIAGNOSIS — R002 Palpitations: Secondary | ICD-10-CM | POA: Insufficient documentation

## 2018-12-13 LAB — BASIC METABOLIC PANEL
Anion gap: 7 (ref 5–15)
BUN: 12 mg/dL (ref 6–20)
CO2: 25 mmol/L (ref 22–32)
Calcium: 9.3 mg/dL (ref 8.9–10.3)
Chloride: 107 mmol/L (ref 98–111)
Creatinine, Ser: 0.81 mg/dL (ref 0.44–1.00)
GFR calc Af Amer: >60 mL/min (ref >60)
GFR calc non Af Amer: >60 mL/min (ref >60)
Glucose, Bld: 104 mg/dL — ABNORMAL HIGH (ref 70–99)
Potassium: 4.2 mmol/L (ref 3.5–5.1)
Sodium: 139 mmol/L (ref 135–145)

## 2018-12-13 LAB — CBC
HCT: 43.5 % (ref 36.0–46.0)
Hemoglobin: 14 g/dL (ref 12.0–15.0)
MCH: 30.9 pg (ref 26.0–34.0)
MCHC: 32.2 g/dL (ref 30.0–36.0)
MCV: 96 fL (ref 80.0–100.0)
Platelets: 286 10*3/uL (ref 150–400)
RBC: 4.53 MIL/uL (ref 3.87–5.11)
RDW: 11.9 % (ref 11.5–15.5)
WBC: 4.8 10*3/uL (ref 4.0–10.5)
nRBC: 0 % (ref 0.0–0.2)

## 2018-12-13 LAB — TROPONIN I (HIGH SENSITIVITY): Troponin I (High Sensitivity): <2 ng/L (ref <18)

## 2018-12-13 LAB — I-STAT BETA HCG BLOOD, ED (MC, WL, AP ONLY): I-stat hCG, quantitative: <5 m[IU]/mL (ref <5)

## 2018-12-13 MED ORDER — SODIUM CHLORIDE 0.9% FLUSH: 3.0000 mL | Freq: Once | INTRAVENOUS | Status: DC

## 2018-12-13 NOTE — ED Notes (Signed)
PT left stated she was not waiting any longer

## 2018-12-13 NOTE — ED Provider Notes (Signed)
[facility]    CSN: [id number] Arrival date & time: 12/13/18  1602      History   Chief Complaint Chief Complaint  Patient presents with   Chest Pain    HPI Amy [name] is a 58 y.o. female with history of gastroesophageal reflux disease and hypertension comes to urgent care with complaints of 1 day history of precordial chest pain.  Chest pain is pressure type with occasionally sharp.  Symptoms started yesterday when patient was having dinner.  Pain is severe and currently 8 out of 10.  She took Nexium with no relief.  No known aggravating factors.  Patient denies any radiation of pain.  Denies any palpitations, diaphoresis, nausea, vomiting or shortness of breath.Marland Kitchen   HPI  Past Medical History:  Diagnosis Date   GERD (gastroesophageal reflux disease)    Glaucoma    Hypertension    Prediabetes    Vitamin D deficiency 01/15/2018    Patient Active Problem List   Diagnosis Date Noted   Vitamin D deficiency 01/15/2018   Prediabetes 01/03/2018   Gastroesophageal reflux disease 01/03/2018   Essential hypertension 01/03/2018   Chronic bilateral low back pain without sciatica 09/20/2017   ABDOMINAL BLOATING 03/23/2010    Past Surgical History:  Procedure Laterality Date   CESAREAN SECTION      OB History   No obstetric history on file.      Home Medications    Prior to Admission medications   Medication Sig Start Date End Date Taking? Authorizing Provider  amLODipine (NORVASC) 10 MG tablet TAKE 1 TABLET(10 MG) BY MOUTH DAILY 11/06/18   Henson, Vickie L, NP-C  diclofenac (VOLTAREN) 75 MG EC tablet Take 1 tablet (75 mg total) by mouth 2 (two) times daily. 09/20/17   Henson, Vickie L, NP-C  hydrochlorothiazide (MICROZIDE) 12.5 MG capsule TAKE 1 CAPSULE(12.5 MG) BY MOUTH DAILY 09/13/18   Henson, Vickie L, NP-C  ranitidine (ZANTAC) 150 MG tablet ranitidine 150 mg tablet    [provider]  timolol (TIMOPTIC) 0.5 % ophthalmic solution Place 1  drop into both eyes 2 (two) times daily.    [provider]    Family History Family History  Problem Relation Age of Onset   Hypertension Mother    Hypertension Father    Hypertension Sister    Hypertension Brother     Social History Social History   Tobacco Use   Smoking status: Never Smoker   Smokeless tobacco: Never Used  Substance Use Topics   Alcohol use: No   Drug use: No     Allergies   Patient has no known allergies.   Review of Systems Review of Systems  Constitutional: Negative.   HENT: Negative.   Respiratory: Positive for chest tightness. Negative for cough, shortness of breath and wheezing.   Cardiovascular: Positive for chest pain and palpitations.  Gastrointestinal: Negative.   Genitourinary: Negative.   Musculoskeletal: Negative.   Skin: Negative.   Neurological: Negative.      Physical Exam Triage Vital Signs ED Triage Vitals [12/13/18 1615]  Enc Vitals Group     BP 114/63     Pulse Rate (!) 57     Resp 16     Temp 98.1 F (36.7 C)     Temp Source Oral     SpO2 100 %     Weight      Height      Head Circumference      Peak Flow  Pain Score 6     Pain Loc      Pain Edu?      Excl. in [place]?    No data found.  Updated Vital Signs BP 114/63 (BP Location: Left Arm)    Pulse (!) 57    Temp 98.1 F (36.7 C) (Oral)    Resp 16    SpO2 100%   Visual Acuity Right Eye Distance:   Left Eye Distance:   Bilateral Distance:    Right Eye Near:   Left Eye Near:    Bilateral Near:     Physical Exam Vitals signs and nursing note reviewed.  Constitutional:      General: She is in acute distress.     Appearance: She is well-developed. She is not ill-appearing or toxic-appearing.  Cardiovascular:     Rate and Rhythm: Normal rate.     Pulses:          Carotid pulses are 2+ on the right side and 2+ on the left side.      Radial pulses are 2+ on the right side and 2+ on the left side.       Dorsalis pedis pulses are 2+  on the right side and 2+ on the left side.       Posterior tibial pulses are 2+ on the right side and 2+ on the left side.     Heart sounds: Normal heart sounds. Heart sounds not distant. No murmur.  Pulmonary:     Breath sounds: Normal breath sounds. No wheezing, rhonchi or rales.  Chest:     Chest wall: No mass, tenderness, crepitus or edema.  Abdominal:     Palpations: Abdomen is soft.  Skin:    General: Skin is warm.     Capillary Refill: Capillary refill takes less than 2 seconds.  Neurological:     General: No focal deficit present.     Mental Status: She is alert and oriented to person, place, and time.      UC Treatments / Results  Labs (all labs ordered are listed, but only abnormal results are displayed) Labs Reviewed - No data to display  EKG   Radiology No results found.  Procedures Procedures (including critical care time)  Medications Ordered in UC Medications - No data to display  Initial Impression / Assessment and Plan / UC Course  I have reviewed the triage vital signs and the nursing notes.  Pertinent labs & imaging results that were available during my care of the patient were reviewed by me and considered in my medical decision making (see chart for details).     1.  Chest pain: EKG showed sinus bradycardia.  Patient continues to have precordial chest pain currently 8 out of 10.  Given the patient's age and history of hypertension, patient will need myocardial infarction rule out.  Patient will be discharged from the urgent care with recommendations to go to the emergency department for further work-up to rule out myocardial infarction.   Final Clinical Impressions(s) / UC Diagnoses   Final diagnoses:  None   Discharge Instructions   None    ED Prescriptions    None     Controlled Substance Prescriptions [place] Controlled Substance Registry consulted? No   Chase Picket, MD 12/13/18 1719

## 2018-12-13 NOTE — ED Triage Notes (Signed)
Pt presents with central chest pain since last night.

## 2018-12-13 NOTE — ED Triage Notes (Signed)
Patient sent from [facility] [address] for further evaluation of central, non-radiating chest pain since last night, describes as aching and pressure and constant. Took Nexium without relief. Denies shortness of breath, dizziness, N/V. Resp e/u, skin w/d.

## 2018-12-13 NOTE — ED Notes (Signed)
Patient is being discharged from the [facility] and sent to the Emergency Department via wheelchair by staff. Per Dr. Lanny Cramp, patient is stable but in need of higher level of care due to continued chest pain despite normal EKG. Patient is aware and verbalizes understanding of plan of care.  Vitals:   12/13/18 1615  BP: 114/63  Pulse: (!) 57  Resp: 16  Temp: 98.1 F (36.7 C)  SpO2: 100%

## 2018-12-14 ENCOUNTER — Ambulatory Visit (INDEPENDENT_AMBULATORY_CARE_PROVIDER_SITE_OTHER): Payer: BC Managed Care – PPO | Admitting: Orthopedic Surgery

## 2018-12-14 ENCOUNTER — Telehealth: Payer: Self-pay | Admitting: Internal Medicine

## 2018-12-14 DIAGNOSIS — M5441 Lumbago with sciatica, right side: Secondary | ICD-10-CM | POA: Diagnosis not present

## 2018-12-14 DIAGNOSIS — M654 Radial styloid tenosynovitis [de Quervain]: Secondary | ICD-10-CM | POA: Diagnosis not present

## 2018-12-14 DIAGNOSIS — M5442 Lumbago with sciatica, left side: Secondary | ICD-10-CM | POA: Diagnosis not present

## 2018-12-14 DIAGNOSIS — G8929 Other chronic pain: Secondary | ICD-10-CM | POA: Diagnosis not present

## 2018-12-14 NOTE — Telephone Encounter (Signed)
Called and left message and pt needs an hospital follow-up for chest pain and GERD

## 2018-12-15 ENCOUNTER — Encounter: Payer: Self-pay | Admitting: Orthopedic Surgery

## 2018-12-15 DIAGNOSIS — M654 Radial styloid tenosynovitis [de Quervain]: Secondary | ICD-10-CM

## 2018-12-15 NOTE — Progress Notes (Signed)
Office Visit Note   [name]           Date of Birth: 11-Jun-1960           MRN: [id number] Visit Date: 12/14/2018 Requested by: Girtha Rm, NP-C [place],  [place] [address] PCP: Girtha Rm, NP-C  Subjective: Chief Complaint  Patient presents with  . Left Hip - Pain    HPI: Patient presents for follow-up of left hip leg and back pain.  She reports chronic pain.  She is getting worse.  Reports bilateral radicular symptoms.  This wakes her from sleep at night.  It is worse with coughing and sneezing.  She also reports right wrist pain.  Has what appears to be de Quervain's tenosynovitis with definite tenderness over the first dorsal compartment.  She is not on any blood thinners.  She had MRI scan in July 2019 which showed severe facet degeneration L5-S1 with subarticular foraminal stenosis bilaterally at L5-S1.  I think she be a good candidate for facet joint injections and possible ablation.              ROS: All systems reviewed are negative as they relate to the chief complaint within the history of present illness.  Patient denies  fevers or chills.   Assessment & Plan: Visit Diagnoses:  1. Chronic midline low back pain with bilateral sciatica   2. 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  We can get her set up for epidural steroid injection with Dr. Ernestina Patches.  Regarding her right wrist she is okay with injection at this time.  Understands there may be depigmentation.  Other risk and benefits are discussed.  Injection performed today under ultrasound guidance.  Follow-up with Dr. Ernestina Patches for back injection.  Follow-Up Instructions: No follow-ups on file.   Orders:  Orders Placed This Encounter  Procedures  . Ambulatory referral to Physical Medicine Rehab   No orders of the defined types were placed in this  encounter.     Procedures: Hand/UE Inj: R extensor compartment 1 for de Quervain's tenosynovitis on 12/15/2018 11:03 AM Indications: therapeutic Details: 25 G needle, ultrasound-guided radial approach Outcome: tolerated well, no immediate complications Procedure, treatment alternatives, risks and benefits explained, specific risks discussed. Consent was given by the patient. Immediately prior to procedure a time out was called to verify the correct patient, procedure, equipment, support staff and site/side marked as required. Patient was prepped and draped in the usual sterile fashion.       Clinical Data: No additional findings.  Objective: Vital Signs: There were no vitals taken for this visit.  Physical Exam:   Constitutional: Patient appears well-developed HEENT:  Head: Normocephalic Eyes:EOM are normal Neck: Normal range of motion Cardiovascular: Normal rate Pulmonary/chest: Effort normal Neurologic: Patient is alert Skin: Skin is warm Psychiatric: Patient has normal mood and affect    Ortho Exam: Ortho exam demonstrates full active and passive range of motion of the right wrist.  She does have tenderness over the radial styloid plus positive Finkelstein's test.  Motor sensory function in the right hand is intact.  No snuffbox tenderness.  Patient also has mildly positive nerve root tension signs bilaterally but good ankle dorsiflexion plantarflexion quad hamstring strength testing.  More pain with extension than flexion.  No trochanteric tenderness is present.  Specialty Comments:  No specialty comments available.  Imaging: No  results found.   PMFS History: Patient Active Problem List   Diagnosis Date Noted  . Vitamin D deficiency 01/15/2018  . Prediabetes 01/03/2018  . Gastroesophageal reflux disease 01/03/2018  . Essential hypertension 01/03/2018  . Chronic bilateral low back pain without sciatica 09/20/2017  . ABDOMINAL BLOATING 03/23/2010   Past  Medical History:  Diagnosis Date  . GERD (gastroesophageal reflux disease)   . Glaucoma   . Hypertension   . Prediabetes   . Vitamin D deficiency 01/15/2018    Family History  Problem Relation Age of Onset  . Hypertension Mother   . Hypertension Father   . Hypertension Sister   . Hypertension Brother     Past Surgical History:  Procedure Laterality Date  . CESAREAN SECTION     Social History   Occupational History  . Not on file  Tobacco Use  . Smoking status: Never Smoker  . Smokeless tobacco: Never Used  Substance and Sexual Activity  . Alcohol use: No  . Drug use: No  . Sexual activity: Not on file

## 2018-12-19 NOTE — Telephone Encounter (Signed)
Called pt lvm #2

## 2018-12-25 ENCOUNTER — Telehealth: Payer: Self-pay | Admitting: Orthopedic Surgery

## 2018-12-25 NOTE — Telephone Encounter (Signed)
Message Sent Error

## 2018-12-25 NOTE — Telephone Encounter (Signed)
Patient came into the clinic requesting an a work note that states she can only work 6am to 3p.m. Monday through Friday.  The hours she is working right now is too much for her.  CB#[phone].  Thank you

## 2018-12-25 NOTE — Telephone Encounter (Signed)
Has she followed up with Dr. Ernestina Patches?  And realistically on this work note I can state that she needs to work reduced hours for a month while her back is improved but I cannot really say 6 AM to 3 PM what are that LDL is just can be reduced hours and they have to work it out

## 2018-12-25 NOTE — Telephone Encounter (Signed)
Patient requests that you fax the letter to her HR at [phone].  Thank you.

## 2018-12-25 NOTE — Telephone Encounter (Signed)
Ok for this note? 

## 2018-12-26 NOTE — Telephone Encounter (Signed)
Patient came into the clinic to inquire about the note.  She has an appointment with Dr. Ernestina Patches on October 8th.  Please fax the note to the listed fax number from previous message.  Also will you call the patient when the note is ready.  CB#[phone].  Thank you.

## 2018-12-26 NOTE — Telephone Encounter (Signed)
Note faxed. Tried calling patient to advise per Dr Marlou Sa No answer. Left detailed VM advising

## 2018-12-28 ENCOUNTER — Other Ambulatory Visit: Payer: Self-pay | Admitting: Family Medicine

## 2018-12-31 NOTE — Telephone Encounter (Signed)
Left message for pt to call back  °

## 2018-12-31 NOTE — Telephone Encounter (Signed)
Is this okay to refill eye drops, pt has an appt in october

## 2018-12-31 NOTE — Telephone Encounter (Signed)
Amlodipine was refilled last month with multiple refills as pt has an appt in october

## 2018-12-31 NOTE — Telephone Encounter (Signed)
Please ask what the drops are for and who is her eye doctor?

## 2019-01-01 NOTE — Telephone Encounter (Signed)
Left message

## 2019-01-10 ENCOUNTER — Encounter: Payer: Self-pay | Admitting: Physical Medicine and Rehabilitation

## 2019-01-10 ENCOUNTER — Ambulatory Visit (INDEPENDENT_AMBULATORY_CARE_PROVIDER_SITE_OTHER): Payer: BC Managed Care – PPO | Admitting: Physical Medicine and Rehabilitation

## 2019-01-10 ENCOUNTER — Other Ambulatory Visit: Payer: Self-pay

## 2019-01-10 ENCOUNTER — Ambulatory Visit: Payer: Self-pay

## 2019-01-10 ENCOUNTER — Telehealth: Payer: Self-pay | Admitting: *Deleted

## 2019-01-10 VITALS — BP 148/77 | HR 70

## 2019-01-10 DIAGNOSIS — M5416 Radiculopathy, lumbar region: Secondary | ICD-10-CM | POA: Diagnosis not present

## 2019-01-10 DIAGNOSIS — M48061 Spinal stenosis, lumbar region without neurogenic claudication: Secondary | ICD-10-CM

## 2019-01-10 MED ORDER — BETAMETHASONE SOD PHOS & ACET 6 (3-3) MG/ML IJ SUSP: 12.0000 mg | Freq: Once | INTRAMUSCULAR | Status: DC

## 2019-01-10 NOTE — Progress Notes (Signed)
Numeric Pain Rating Scale and Functional Assessment Average Pain 10   In the last MONTH (on 0-10 scale) has pain interfered with the following?  1. General activity like being  able to carry out your everyday physical activities such as walking, climbing stairs, carrying groceries, or moving a chair?  Rating(10)   +Driver, -BT, -Dye Allergies. 

## 2019-01-10 NOTE — Telephone Encounter (Signed)
Please advise. Thanks.  

## 2019-01-11 ENCOUNTER — Encounter: Payer: Self-pay | Admitting: Family Medicine

## 2019-01-11 ENCOUNTER — Ambulatory Visit (INDEPENDENT_AMBULATORY_CARE_PROVIDER_SITE_OTHER): Payer: BC Managed Care – PPO | Admitting: Family Medicine

## 2019-01-11 VITALS — BP 130/80 | HR 68 | Temp 98.7°F | Ht 63.0 in | Wt 177.4 lb

## 2019-01-11 DIAGNOSIS — Z0000 Encounter for general adult medical examination without abnormal findings: Secondary | ICD-10-CM

## 2019-01-11 DIAGNOSIS — K219 Gastro-esophageal reflux disease without esophagitis: Secondary | ICD-10-CM | POA: Diagnosis not present

## 2019-01-11 DIAGNOSIS — I10 Essential (primary) hypertension: Secondary | ICD-10-CM | POA: Diagnosis not present

## 2019-01-11 DIAGNOSIS — H524 Presbyopia: Secondary | ICD-10-CM | POA: Diagnosis not present

## 2019-01-11 DIAGNOSIS — E559 Vitamin D deficiency, unspecified: Secondary | ICD-10-CM

## 2019-01-11 DIAGNOSIS — Z13220 Encounter for screening for lipoid disorders: Secondary | ICD-10-CM

## 2019-01-11 DIAGNOSIS — Z87898 Personal history of other specified conditions: Secondary | ICD-10-CM | POA: Insufficient documentation

## 2019-01-11 DIAGNOSIS — H401131 Primary open-angle glaucoma, bilateral, mild stage: Secondary | ICD-10-CM | POA: Diagnosis not present

## 2019-01-11 DIAGNOSIS — Z1231 Encounter for screening mammogram for malignant neoplasm of breast: Secondary | ICD-10-CM

## 2019-01-11 DIAGNOSIS — R7303 Prediabetes: Secondary | ICD-10-CM

## 2019-01-11 DIAGNOSIS — Z8249 Family history of ischemic heart disease and other diseases of the circulatory system: Secondary | ICD-10-CM | POA: Insufficient documentation

## 2019-01-11 LAB — POCT GLYCOSYLATED HEMOGLOBIN (HGB A1C): Hemoglobin A1C: 6.2 % — AB (ref 4.0–5.6)

## 2019-01-11 MED ORDER — ESOMEPRAZOLE MAGNESIUM 40 MG PO CPDR: 40.0000 mg | DELAYED_RELEASE_CAPSULE | Freq: Every day | ORAL | 3 refills | Status: DC

## 2019-01-11 MED ORDER — FAMOTIDINE 40 MG PO TABS: 40.0000 mg | ORAL_TABLET | Freq: Every evening | ORAL | 1 refills | Status: DC | PRN

## 2019-01-11 MED ORDER — HYDROCHLOROTHIAZIDE 12.5 MG PO CAPS: ORAL_CAPSULE | ORAL | 5 refills | Status: DC

## 2019-01-11 MED ORDER — AMLODIPINE BESYLATE 10 MG PO TABS: ORAL_TABLET | ORAL | 5 refills | Status: DC

## 2019-01-11 NOTE — Progress Notes (Signed)
Subjective:    Patient ID: Amy [name], female    DOB: 1960-12-16, 58 y.o.   MRN: [id number]  HPI Chief Complaint  Patient presents with  . fasting cpe    fasting cpe, declines flu shot   Sh is here for a fasting CPE. Her son who is helping translate is also a MD with [facility].   She is from [place] originally. Previous care was with Dr. Melvern Sample She has also had care in [place] including most recent colonoscopy and mammogram.   Other providers: OB/GYN - Dr. Carlota Raspberry  Eye doctor- Dr. Venetia Maxon [place] [facility]  Orthopedist- Dr. Marlou Sa and Dr. Ernestina Patches  HTN- reports taking amlodipine daily. She ran out of HCTZ 2 weeks ago.  Checks BP at home but not often. This morning her BP was 143/87 at home.   Prediabetes- Hgb A1c 6.2% in October 2019.   GERD- currently taking ranitidine per son. She has taken several PPIs in the past but not recently. Did not like pantoprazole. She has not seen GI locally. Up to date on colonoscopy. Never had EGD.   She has taken dicloenac for chronic back pain.  No alcohol or smoking.   Chronic back pain managed by orthopedist.   Vitamin D deficiency- reports taking 5,000 IUs daily   Recent visit to the ED for chest pain. Denies having chest pain since then. She and her son would like a referral to cardiology for evaluation due to multiple risk factors for heart disease.  Denies palpitations, DOE, orthopnea, LE edema.   Social history: Lives with her family. States she has a son who is 27 and 3 daughters. Single, works in a Teacher, adult education for [employer] Denies smoking, drinking alcohol, drug use  Diet: fairly healthy  Excerise: none   Immunizations: declines flu shot  Health maintenance:  Mammogram: overdue  Colonoscopy: done in [place] April 2019  Last Gynecological Exam: normal pap smear in 2018.  DEXA: done at Dr. Vonna Kotyk office Last Menstrual cycle: over one year ago Last Dental Exam: last year  Last Eye Exam: appt today   Wears seatbelt  always, smoke detectors in home and functioning, does not text while driving and feels safe in home environment.   Reviewed allergies, medications, past medical, surgical, family, and social history.   Review of Systems Review of Systems Constitutional: -fever, -chills, -sweats, -unexpected weight change,-fatigue ENT: -runny nose, -ear pain, -sore throat Cardiology:  -chest pain, -palpitations, -edema Respiratory: -cough, -shortness of breath, -wheezing Gastroenterology: -abdominal pain, -nausea, -vomiting, -diarrhea, -constipation  Hematology: -bleeding or bruising problems Musculoskeletal: -arthralgias, -myalgias, -joint swelling, + chronic back pain Ophthalmology: -vision changes Urology: -dysuria, -difficulty urinating, -hematuria, -urinary frequency, -urgency Neurology: -headache, -weakness, -tingling, -numbness       Objective:   Physical Exam BP 130/80   Pulse 68   Temp 98.7 F (37.1 C)   Ht 5\' 3"  (1.6 m)   Wt 177 lb 6.4 oz (80.5 kg)   BMI 31.42 kg/m   General Appearance:    Alert, cooperative, no distress, appears stated age  Head:    Normocephalic, without obvious abnormality, atraumatic  Eyes:    PERRL, conjunctiva/corneas clear, EOM's intact  Ears:    Normal TM's and external ear canals  Nose:   Nares normal, mucosa normal, no drainage or sinus   tenderness  Throat:   Lips, mucosa, and tongue normal; teeth and gums normal  Neck:   Supple, no lymphadenopathy;  thyroid:  no   enlargement/tenderness/nodules  Back:  not examined   Lungs:     Clear to auscultation bilaterally without wheezes, rales or     ronchi; respirations unlabored  Chest Wall:    No tenderness or deformity   Heart:    Regular rate and rhythm   Breast Exam:   mammogram ordered       Abdomen:     Soft, non-tender, nondistended, normoactive bowel sounds,    no masses, no hepatosplenomegaly  Genitalia:    Normal external genitalia without lesions.  BUS and vagina normal; cervix without lesions,  or cervical motion tenderness. No abnormal vaginal discharge.  Uterus and adnexa not enlarged, nontender, no masses.  Pap performed     Extremities:   No clubbing, cyanosis or edema  Pulses:   2+ and symmetric all extremities  Skin:   Skin color, texture, turgor normal, no rashes or lesions  Lymph nodes:   Cervical, supraclavicular, and axillary nodes normal  Neurologic:   CNII-XII intact, normal strength, sensation and gait; reflexes 2+ and symmetric throughout          Psych:   Normal mood, affect, hygiene and grooming.        Assessment & Plan:  Routine general medical examination at a health care facility - Plan: CBC with Differential/Platelet, Comprehensive metabolic panel, TSH, T4, free -Discussed preventive healthcare and she is up-to-date on Pap smear and colonoscopy.  She is overdue for mammogram and she will call to schedule this.  Discussed immunizations.  She declines flu shot.  Discussed safety.  She will continue seeing orthopedist for chronic pain.  Prediabetes - Plan: POCT glycosylated hemoglobin (Hb A1C), Ambulatory referral to Cardiology -Hemoglobin A1c is controlled at 6.2%.  Counseling on limiting sugar and carbohydrates and increasing physical activity.  Essential hypertension - Plan: Ambulatory referral to Cardiology, hydrochlorothiazide (MICROZIDE) 12.5 MG capsule, amLODipine (NORVASC) 10 MG tablet Her blood pressures in goal range.  Will refill amlodipine and HCTZ.  Encouraged her to continue checking her blood pressure at home.  She can call and schedule a nurse visit to bring her blood pressure cuff past for comparison if she would like.  Counseling on low-sodium diet.  Gastroesophageal reflux disease, unspecified whether esophagitis present - Plan: esomeprazole (NEXIUM) 40 MG capsule, famotidine (PEPCID) 40 MG tablet -In-depth counseling on lifestyle management for GERD.  She will stop ranitidine and try Nexium once daily.  I will also prescribe Pepcid for her to  take at bedtime if needed.  We will try this for 4 weeks and if she is not symptom-free at that point, I will refer her to GI.  Vitamin D deficiency - Plan: VITAMIN D 25 Hydroxy (Vit-D Deficiency, Fractures) -Continue on current dose of vitamin D supplement and follow-up pending lab result  History of chest pain - Plan: Ambulatory referral to Cardiology -Discussed that she does have multiple risk factors for heart disease.  Denies any recent chest pain or other cardiopulmonary symptoms since ED visit.  I will refer to cardiology for further evaluation per request from patient and her son who is a MD.  Family history of heart disease - Plan: Ambulatory referral to Cardiology  Encounter for screening mammogram for malignant neoplasm of breast - Plan: MM DIGITAL SCREENING BILATERAL -She will call and schedule her mammogram at the breast center  Screening for lipid disorders - Plan: Lipid panel -Follow-up pending results.  Consider statin if appropriate

## 2019-01-11 NOTE — Patient Instructions (Addendum)
Call and schedule [facility].   You will receive a call from [facility], cardiology.   For reflux:  Avoid eating large meals. Eat small frequent meals and avoid eating and laying down at least 3 hours after eating.  Avoid foods or beverages that make your reflux worse.   Take the Nexium in the morning and then if needed in the evening, you can take the Pepcid.  Follow up with me in 4 weeks and let me know how you are doing.   We will call your son with your results.     Preventive Care 62-78 Years Old, Female Preventive care refers to visits with your health care provider and lifestyle choices that can promote health and wellness. This includes:  A yearly physical exam. This may also be called an annual well check.  Regular dental visits and eye exams.  Immunizations.  Screening for certain conditions.  Healthy lifestyle choices, such as eating a healthy diet, getting regular exercise, not using drugs or products that contain nicotine and tobacco, and limiting alcohol use. What can I expect for my preventive care visit? Physical exam Your health care provider will check your:  Height and weight. This may be used to calculate body mass index (BMI), which tells if you are at a healthy weight.  Heart rate and blood pressure.  Skin for abnormal spots. Counseling Your health care provider may ask you questions about your:  Alcohol, tobacco, and drug use.  Emotional well-being.  Home and relationship well-being.  Sexual activity.  Eating habits.  Work and work Statistician.  Method of birth control.  Menstrual cycle.  Pregnancy history. What immunizations do I need?  Influenza (flu) vaccine  This is recommended every year. Tetanus, diphtheria, and pertussis (Tdap) vaccine  You may need a Td booster every 10 years. Varicella (chickenpox) vaccine  You may need this if you have not been vaccinated. Zoster (shingles) vaccine  You may  need this after age 105. Measles, mumps, and rubella (MMR) vaccine  You may need at least one dose of MMR if you were born in 1957 or later. You may also need a second dose. Pneumococcal conjugate (PCV13) vaccine  You may need this if you have certain conditions and were not previously vaccinated. Pneumococcal polysaccharide (PPSV23) vaccine  You may need one or two doses if you smoke cigarettes or if you have certain conditions. Meningococcal conjugate (MenACWY) vaccine  You may need this if you have certain conditions. Hepatitis A vaccine  You may need this if you have certain conditions or if you travel or work in places where you may be exposed to hepatitis A. Hepatitis B vaccine  You may need this if you have certain conditions or if you travel or work in places where you may be exposed to hepatitis B. Haemophilus influenzae type b (Hib) vaccine  You may need this if you have certain conditions. Human papillomavirus (HPV) vaccine  If recommended by your health care provider, you may need three doses over 6 months. You may receive vaccines as individual doses or as more than one vaccine together in one shot (combination vaccines). Talk with your health care provider about the risks and benefits of combination vaccines. What tests do I need? Blood tests  Lipid and cholesterol levels. These may be checked every 5 years, or more frequently if you are over 47 years old.  Hepatitis C test.  Hepatitis B test. Screening  Lung cancer screening. You may have  this screening every year starting at age 33 if you have a 30-pack-year history of smoking and currently smoke or have quit within the past 15 years.  Colorectal cancer screening. All adults should have this screening starting at age 17 and continuing until age 91. Your health care provider may recommend screening at age 29 if you are at increased risk. You will have tests every 1-10 years, depending on your results and the type  of screening test.  Diabetes screening. This is done by checking your blood sugar (glucose) after you have not eaten for a while (fasting). You may have this done every 1-3 years.  Mammogram. This may be done every 1-2 years. Talk with your health care provider about when you should start having regular mammograms. This may depend on whether you have a family history of breast cancer.  BRCA-related cancer screening. This may be done if you have a family history of breast, ovarian, tubal, or peritoneal cancers.  Pelvic exam and Pap test. This may be done every 3 years starting at age 21. Starting at age 82, this may be done every 5 years if you have a Pap test in combination with an HPV test. Other tests  Sexually transmitted disease (STD) testing.  Bone density scan. This is done to screen for osteoporosis. You may have this scan if you are at high risk for osteoporosis. Follow these instructions at home: Eating and drinking  Eat a diet that includes fresh fruits and vegetables, whole grains, lean protein, and low-fat dairy.  Take vitamin and mineral supplements as recommended by your health care provider.  Do not drink alcohol if: ? Your health care provider tells you not to drink. ? You are pregnant, may be pregnant, or are planning to become pregnant.  If you drink alcohol: ? Limit how much you have to 0-1 drink a day. ? Be aware of how much alcohol is in your drink. In the [place] one drink equals one 12 oz bottle of beer (355 mL), one 5 oz glass of wine (148 mL), or one 1 oz glass of hard liquor (44 mL). Lifestyle  Take daily care of your teeth and gums.  Stay active. Exercise for at least 30 minutes on 5 or more days each week.  Do not use any products that contain nicotine or tobacco, such as cigarettes, e-cigarettes, and chewing tobacco. If you need help quitting, ask your health care provider.  If you are sexually active, practice safe sex. Use a condom or other form of  birth control (contraception) in order to prevent pregnancy and STIs (sexually transmitted infections).  If told by your health care provider, take low-dose aspirin daily starting at age 25. What's next?  Visit your health care provider once a year for a well check visit.  Ask your health care provider how often you should have your eyes and teeth checked.  Stay up to date on all vaccines. This information is not intended to replace advice given to you by your health care provider. Make sure you discuss any questions you have with your health care provider. Document Released: 04/17/2015 Document Revised: 11/30/2017 Document Reviewed: 11/30/2017 Elsevier Patient Education  2020 [employer].

## 2019-01-12 LAB — CBC WITH DIFFERENTIAL/PLATELET
Basophils Absolute: 0 10*3/uL (ref 0.0–0.2)
Basos: 0 %
EOS (ABSOLUTE): 0 10*3/uL (ref 0.0–0.4)
Eos: 0 %
Hematocrit: 44.9 % (ref 34.0–46.6)
Hemoglobin: 14.6 g/dL (ref 11.1–15.9)
Immature Grans (Abs): 0 10*3/uL (ref 0.0–0.1)
Immature Granulocytes: 0 %
Lymphocytes Absolute: 1.3 10*3/uL (ref 0.7–3.1)
Lymphs: 14 %
MCH: 30.4 pg (ref 26.6–33.0)
MCHC: 32.5 g/dL (ref 31.5–35.7)
MCV: 94 fL (ref 79–97)
Monocytes Absolute: 0.4 10*3/uL (ref 0.1–0.9)
Monocytes: 4 %
Neutrophils Absolute: 7.6 10*3/uL — ABNORMAL HIGH (ref 1.4–7.0)
Neutrophils: 82 %
Platelets: 308 10*3/uL (ref 150–450)
RBC: 4.8 x10E6/uL (ref 3.77–5.28)
RDW: 12.4 % (ref 11.7–15.4)
WBC: 9.3 10*3/uL (ref 3.4–10.8)

## 2019-01-12 LAB — COMPREHENSIVE METABOLIC PANEL
ALT: 19 IU/L (ref 0–32)
AST: 12 IU/L (ref 0–40)
Albumin/Globulin Ratio: 1.4 (ref 1.2–2.2)
Albumin: 4.2 g/dL (ref 3.8–4.9)
Alkaline Phosphatase: 82 IU/L (ref 39–117)
BUN/Creatinine Ratio: 18 (ref 9–23)
BUN: 14 mg/dL (ref 6–24)
Bilirubin Total: 0.3 mg/dL (ref 0.0–1.2)
CO2: 21 mmol/L (ref 20–29)
Calcium: 10.3 mg/dL — ABNORMAL HIGH (ref 8.7–10.2)
Chloride: 108 mmol/L — ABNORMAL HIGH (ref 96–106)
Creatinine, Ser: 0.77 mg/dL (ref 0.57–1.00)
GFR calc Af Amer: 99 mL/min/{1.73_m2} (ref >59)
GFR calc non Af Amer: 86 mL/min/{1.73_m2} (ref >59)
Globulin, Total: 2.9 g/dL (ref 1.5–4.5)
Glucose: 131 mg/dL — ABNORMAL HIGH (ref 65–99)
Potassium: 4.4 mmol/L (ref 3.5–5.2)
Sodium: 144 mmol/L (ref 134–144)
Total Protein: 7.1 g/dL (ref 6.0–8.5)

## 2019-01-12 LAB — LIPID PANEL
Chol/HDL Ratio: 2.7 ratio (ref 0.0–4.4)
Cholesterol, Total: 164 mg/dL (ref 100–199)
HDL: 61 mg/dL (ref >39)
LDL Chol Calc (NIH): 93 mg/dL (ref 0–99)
Triglycerides: 47 mg/dL (ref 0–149)
VLDL Cholesterol Cal: 10 mg/dL (ref 5–40)

## 2019-01-12 LAB — T4, FREE: Free T4: 0.96 ng/dL (ref 0.82–1.77)

## 2019-01-12 LAB — VITAMIN D 25 HYDROXY (VIT D DEFICIENCY, FRACTURES): Vit D, 25-Hydroxy: 47.5 ng/mL (ref 30.0–100.0)

## 2019-01-12 LAB — TSH: TSH: 0.806 u[IU]/mL (ref 0.450–4.500)

## 2019-01-14 ENCOUNTER — Telehealth: Payer: Self-pay | Admitting: Radiology

## 2019-01-14 NOTE — Telephone Encounter (Signed)
Patient needs work note that she is unable to work more than 40 hours per week. Note OK per Dr. Marlou Sa. Note entered into system.

## 2019-01-14 NOTE — Telephone Encounter (Signed)
Could you please call patient and let her know work note is ready for pick up?

## 2019-01-15 NOTE — Telephone Encounter (Signed)
Note was entered into system and son called yesterday stating that it was ready. Per request from Dr. Marlou Sa, work note stating patient to work no more than 40 hours per week.

## 2019-02-15 ENCOUNTER — Ambulatory Visit: Payer: BC Managed Care – PPO | Admitting: Family Medicine

## 2019-02-18 ENCOUNTER — Other Ambulatory Visit: Payer: Self-pay | Admitting: Family Medicine

## 2019-02-18 DIAGNOSIS — I10 Essential (primary) hypertension: Secondary | ICD-10-CM

## 2019-02-19 NOTE — Progress Notes (Signed)
Amy [name] - 58 y.o. female MRN [id number]  Date of birth: [birth year]  Office Visit Note: Visit Date: 01/10/2019 PCP: Girtha Rm, NP-C Referred by: Girtha Rm, NP-C  Subjective: Chief Complaint  Patient presents with  . Lower Back - Pain   HPI:  Amy [name] is a 58 y.o. female who comes in today At the request of G. Alphonzo Severance, MD for bilateral S1 transforaminal epidural steroid injection.  Patient has transitional S1 segment somewhat lumbarized.  She is having low back pain right more than left but bilateral extending into the buttocks consistent more with an S1 pathology.  She has MRI showing L5-S1 facet joint arthropathy with gaping facet joint and effusion.  She has lateral recess and subarticular stenosis.  No high-grade central stenosis.  No disc extrusion.  Pain is 10 out of 10 and has not really been resolved with home exercise time and medication management through Dr. Marlou Sa and her primary care physician.  We will see how she does with injection.  ROS Otherwise per HPI.  Assessment & Plan: Visit Diagnoses:  1. 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   Meds & Orders:  Meds ordered this encounter  Medications  . DISCONTD: betamethasone acetate-betamethasone sodium phosphate (CELESTONE) injection 12 mg    Orders Placed This Encounter  Procedures  . XR C-ARM NO REPORT  . Epidural Steroid injection    Follow-up: Return if symptoms worsen or fail to improve.   Procedures: No procedures performed  S1 Lumbosacral Transforaminal Epidural Steroid Injection - Sub-Pedicular Approach with Fluoroscopic Guidance   [name]      Date of Birth: 08/26/1960 MRN: [id number] PCP: Girtha Rm, NP-C      Visit Date: 01/10/2019   Universal Protocol:    Date/Time: 11/17/205:20 AM  Consent Given By: the patient  Position:  PRONE  Additional Comments: Vital signs were monitored before  and after the procedure. Patient was prepped and draped in the usual sterile fashion. The correct patient, procedure, and site was verified.   Injection Procedure Details:  Procedure Site One Meds Administered:  Meds ordered this encounter  Medications  . DISCONTD: betamethasone acetate-betamethasone sodium phosphate (CELESTONE) injection 12 mg    Laterality: Bilateral  Location/Site:  S1 Foramen   Needle size: 22 ga.  Needle type: Spinal  Needle Placement: Transforaminal  Findings:   -Comments: Excellent flow of contrast along the nerve and into the epidural space.  Procedure Details: After squaring off the sacral end-plate to get a true AP view, the C-arm was positioned so that the best possible view of the S1 foramen was visualized. The soft tissues overlying this structure were infiltrated with 2-3 ml. of 1% Lidocaine without Epinephrine.    The spinal needle was inserted toward the target using a "trajectory" view along the fluoroscope beam.  Under AP and lateral visualization, the needle was advanced so it did not puncture dura. Biplanar projections were used to confirm position. Aspiration was confirmed to be negative for CSF and/or blood. A 1-2 ml. volume of Isovue-250 was injected and flow of contrast was noted at each level. Radiographs were obtained for documentation purposes.   After attaining the desired flow of contrast documented above, a 0.5 to 1.0 ml test dose of 0.25% Marcaine was injected into each respective transforaminal space.  The patient was observed for 90 seconds post injection.  After no sensory deficits were reported,  and normal lower extremity motor function was noted,   the above injectate was administered so that equal amounts of the injectate were placed at each foramen (level) into the transforaminal epidural space.   Additional Comments:  The patient tolerated the procedure well Dressing: Band-Aid with 2 x 2 sterile gauze    Post-procedure  details: Patient was observed during the procedure. Post-procedure instructions were reviewed.  Patient left the clinic in stable condition.    Clinical History: MRI LUMBAR SPINE WITHOUT CONTRAST  TECHNIQUE: Multiplanar, multisequence MR imaging of the lumbar spine was performed. No intravenous contrast was administered.  COMPARISON:  None.  FINDINGS: Segmentation:  Normal  Alignment:  Normal  Vertebrae:  Normal bone marrow.  Negative for fracture or mass.  Conus medullaris and cauda equina: Conus extends to the L1-2 level. Conus and cauda equina appear normal.  Paraspinal and other soft tissues: Negative for paraspinous mass or fluid collection  Disc levels:  L1-2: Negative  L2-3: Negative  L3-4: Mild disc and facet degeneration without significant stenosis  L4-5: Mild disc and mild facet degeneration. Mild subarticular stenosis bilaterally  L5-S1: Severe facet degeneration bilaterally with facet joint effusions and diffuse facet hypertrophy. Mild disc degeneration. Severe subarticular and foraminal stenosis with bilateral L5 and S1 nerve root impingement.  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  Mild degenerative change L3-4 and L4-5.   Electronically Signed   By: Franchot Gallo M.D.   On: 10/15/2017 10:28     Objective:  VS:  HT:    WT:   BMI:     BP:(!) 148/77  HR:70bpm  TEMP: ( )  RESP:  Physical Exam  Ortho Exam Imaging: No results found.

## 2019-02-19 NOTE — Procedures (Signed)
S1 Lumbosacral Transforaminal Epidural Steroid Injection - Sub-Pedicular Approach with Fluoroscopic Guidance   [name]      Date of Birth: [birth year] MRN: [id number] PCP: Girtha Rm, NP-C      Visit Date: 01/10/2019   Universal Protocol:    Date/Time: 11/17/205:20 AM  Consent Given By: the patient  Position:  PRONE  Additional Comments: Vital signs were monitored before and after the procedure. Patient was prepped and draped in the usual sterile fashion. The correct patient, procedure, and site was verified.   Injection Procedure Details:  Procedure Site One Meds Administered:  Meds ordered this encounter  Medications  . DISCONTD: betamethasone acetate-betamethasone sodium phosphate (CELESTONE) injection 12 mg    Laterality: Bilateral  Location/Site:  S1 Foramen   Needle size: 22 ga.  Needle type: Spinal  Needle Placement: Transforaminal  Findings:   -Comments: Excellent flow of contrast along the nerve and into the epidural space.  Procedure Details: After squaring off the sacral end-plate to get a true AP view, the C-arm was positioned so that the best possible view of the S1 foramen was visualized. The soft tissues overlying this structure were infiltrated with 2-3 ml. of 1% Lidocaine without Epinephrine.    The spinal needle was inserted toward the target using a "trajectory" view along the fluoroscope beam.  Under AP and lateral visualization, the needle was advanced so it did not puncture dura. Biplanar projections were used to confirm position. Aspiration was confirmed to be negative for CSF and/or blood. A 1-2 ml. volume of Isovue-250 was injected and flow of contrast was noted at each level. Radiographs were obtained for documentation purposes.   After attaining the desired flow of contrast documented above, a 0.5 to 1.0 ml test dose of 0.25% Marcaine was injected into each respective transforaminal space.  The patient was observed for 90  seconds post injection.  After no sensory deficits were reported, and normal lower extremity motor function was noted,   the above injectate was administered so that equal amounts of the injectate were placed at each foramen (level) into the transforaminal epidural space.   Additional Comments:  The patient tolerated the procedure well Dressing: Band-Aid with 2 x 2 sterile gauze    Post-procedure details: Patient was observed during the procedure. Post-procedure instructions were reviewed.  Patient left the clinic in stable condition.

## 2019-02-22 ENCOUNTER — Encounter: Payer: Self-pay | Admitting: Cardiology

## 2019-02-22 ENCOUNTER — Other Ambulatory Visit: Payer: Self-pay

## 2019-02-22 ENCOUNTER — Ambulatory Visit (INDEPENDENT_AMBULATORY_CARE_PROVIDER_SITE_OTHER): Payer: BC Managed Care – PPO | Admitting: Cardiology

## 2019-02-22 VITALS — BP 141/88 | HR 64 | Ht 63.0 in | Wt 181.3 lb

## 2019-02-22 DIAGNOSIS — E785 Hyperlipidemia, unspecified: Secondary | ICD-10-CM

## 2019-02-22 DIAGNOSIS — I10 Essential (primary) hypertension: Secondary | ICD-10-CM

## 2019-02-22 DIAGNOSIS — R072 Precordial pain: Secondary | ICD-10-CM

## 2019-02-22 MED ORDER — METOPROLOL TARTRATE 25 MG PO TABS: 25.0000 mg | ORAL_TABLET | Freq: Once | ORAL | 0 refills | Status: DC

## 2019-02-22 NOTE — Progress Notes (Signed)
Cardiology Office Note:    Date:  02/22/2019   ID:  Amy [name], DOB April 06, 1960, MRN [id number]  PCP:  Amy Rm, NP-C  Cardiologist:  Donato Heinz, MD  Electrophysiologist:  None   Referring MD: Amy Rm, NP-C   Chief Complaint  Patient presents with  . Chest Pain   History of Present Illness:    Amy [name] is a 58 y.o. female with a hx of hypertension, prediabetes who is referred by Amy Dingwall, NP for evaluation of chest pain.  She presented to urgent care on 12/13/2018 with chest pain.  She was sent to the ED for evaluation.  High-sensitivity troponin x 1 was negative, but she left before being seen by physician.  She reports that her chest pain has been intermittent.  Described as sharp stabbing pain in center of chest.  Has not noted relationship with exertion.  Occurs about once per month, can last for over an hour.  Sometimes occurs after she eats, but not always.  She denies any shortness of breath.  She takes amlodipine for hypertension.  She is on hydrochlorothiazide as well, but reports has not taken for over a month.  States that she only takes hydrochlorothiazide when her blood pressure is elevated above 140s.  States that when she checks her pressures at home it has been 120s over 80s.  No smoking history.  In her record it lists a family history of heart failure in her mother, but she currently states there is no known family history of heart disease.   Past Medical History:  Diagnosis Date  . GERD (gastroesophageal reflux disease)   . Glaucoma   . Hypertension   . Prediabetes   . Vitamin D deficiency 01/15/2018    Past Surgical History:  Procedure Laterality Date  . CESAREAN SECTION      Current Medications: Current Meds  Medication Sig  . amLODipine (NORVASC) 10 MG tablet TAKE 1 TABLET(10 MG) BY MOUTH DAILY  . famotidine (PEPCID) 40 MG tablet Take 1 tablet (40 mg total) by mouth at bedtime as needed for heartburn or indigestion.   Marland Kitchen VITAMIN D, CHOLECALCIFEROL, PO Take by mouth.  . [DISCONTINUED] esomeprazole (NEXIUM) 40 MG capsule Take 1 capsule (40 mg total) by mouth daily.  . [DISCONTINUED] hydrochlorothiazide (MICROZIDE) 12.5 MG capsule TAKE 1 CAPSULE(12.5 MG) BY MOUTH DAILY  . [DISCONTINUED] timolol (TIMOPTIC) 0.5 % ophthalmic solution Place 1 drop into both eyes 2 (two) times daily.     Allergies:   Patient has no known allergies.   Social History   Socioeconomic History  . Marital status: Single    Spouse name: Not on file  . Number of children: Not on file  . Years of education: Not on file  . Highest education level: Not on file  Occupational History  . Not on file  Social Needs  . Financial resource strain: Not on file  . Food insecurity    Worry: Not on file    Inability: Not on file  . Transportation needs    Medical: Not on file    Non-medical: Not on file  Tobacco Use  . Smoking status: Never Smoker  . Smokeless tobacco: Never Used  Substance and Sexual Activity  . Alcohol use: No  . Drug use: No  . Sexual activity: Not on file  Lifestyle  . Physical activity    Days per week: Not on file    Minutes per session: Not on file  . Stress: Not  on file  Relationships  . Social Herbalist on phone: Not on file    Gets together: Not on file    Attends religious service: Not on file    Active member of club or organization: Not on file    Attends meetings of clubs or organizations: Not on file    Relationship status: Not on file  Other Topics Concern  . Not on file  Social History Narrative  . Not on file     Family History: The patient's family history includes Heart failure in her mother; Hypertension in her brother, father, mother, and sister.  ROS:   Please see the history of present illness.     All other systems reviewed and are negative.  EKGs/Labs/Other Studies Reviewed:    The following studies were reviewed today:   EKG:  EKG is  ordered today.  The ekg  ordered today demonstrates sinus rhythm, rate 59, no ST/T abnormalities  Recent Labs: 01/11/2019: ALT 19; BUN 14; Creatinine, Ser 0.77; Hemoglobin 14.6; Platelets 308; Potassium 4.4; Sodium 144; TSH 0.806  Recent Lipid Panel    Component Value Date/Time   CHOL 164 01/11/2019 1010   TRIG 47 01/11/2019 1010   HDL 61 01/11/2019 1010   CHOLHDL 2.7 01/11/2019 1010   LDLCALC 93 01/11/2019 1010    Physical Exam:    VS:  BP (!) 141/88 (BP Location: Left Arm)   Pulse 64   Ht 5\' 3"  (1.6 m)   Wt 181 lb 4.8 oz (82.2 kg)   SpO2 98%   BMI 32.12 kg/m     Wt Readings from Last 3 Encounters:  02/22/19 181 lb 4.8 oz (82.2 kg)  01/11/19 177 lb 6.4 oz (80.5 kg)  01/03/18 173 lb 12.8 oz (78.8 kg)     GEN:  Well nourished, well developed in no acute distress HEENT: Normal NECK: No JVD; No carotid bruits LYMPHATICS: No lymphadenopathy CARDIAC: RRR, 2/6 systolic murmur RESPIRATORY:  Clear to auscultation without rales, wheezing or rhonchi  ABDOMEN: Soft, non-tender, non-distended MUSCULOSKELETAL:  No edema; No deformity  SKIN: Warm and dry NEUROLOGIC:  Alert and oriented x 3 PSYCHIATRIC:  Normal affect   ASSESSMENT:    1. Precordial pain   2. Essential hypertension   3. Hyperlipidemia, unspecified hyperlipidemia type    PLAN:    In order of problems listed above:  Chest pain: Atypical in description.  Given age and risk factors (hypertension), would classify as low to intermediate risk of obstructive coronary disease -Coronary CTA  Hypertension: On amlodipine 10 mg daily.  She is also supposed to be on hydrochlorothiazide 12.5 mg daily, but reports she only takes it as needed if her pressure is above 140s.  BP elevated in clinic today, recommend taking hydrochlorothiazide daily  Hyperlipidemia: LDL 93 on 01/11/2019.  10-year ASCVD risk score is 5.7%.  Does not meet indication for statin at this time, will follow up results of coronary CTA and can start statin if coronary disease is  seen  RTC in 3 months  Medication Adjustments/Labs and Tests Ordered: Current medicines are reviewed at length with the patient today.  Concerns regarding medicines are outlined above.  Orders Placed This Encounter  Procedures  . CT CORONARY MORPH W/CTA COR W/SCORE W/CA W/CM &/OR WO/CM  . CT CORONARY FRACTIONAL FLOW RESERVE DATA PREP  . CT CORONARY FRACTIONAL FLOW RESERVE FLUID ANALYSIS  . BMP  . EKG 12-Lead   Meds ordered this encounter  Medications  . metoprolol  tartrate (LOPRESSOR) 25 MG tablet    Sig: Take 1 tablet (25 mg total) by mouth once for 1 dose. Take 2 hours prior to procedure.    Dispense:  1 tablet    Refill:  0    Patient Instructions  Medication Instructions:  Dr Gardiner Rhyme recommends that you continue on your current medications as directed. Please refer to the Current Medication list given to you today.  *If you need a refill on your cardiac medications before your next appointment, please call your pharmacy*  Lab Work: Your physician recommends that you return for lab work prior to your Cardiac CT.  If you have labs (blood work) drawn today and your tests are completely normal, you will receive your results only by: Marland Kitchen MyChart Message (if you have MyChart) OR . A paper copy in the mail If you have any lab test that is abnormal or we need to change your treatment, we will call you to review the results.  Testing/Procedures: Your physician has requested that you have cardiac CT. Cardiac computed tomography (CT) is a painless test that uses an x-ray machine to take clear, detailed pictures of your heart. For further information please visit HugeFiesta.tn. Please follow instruction sheet as given.    Follow-Up: At [facility], you and your health needs are our priority.  As part of our continuing mission to provide you with exceptional heart care, we have created designated Provider Care Teams.  These Care Teams include your primary Cardiologist  (physician) and Advanced Practice Providers (APPs -  Physician Assistants and Nurse Practitioners) who all work together to provide you with the care you need, when you need it.  Your next appointment:   3 month(s)  The format for your next appointment:   Either In Person or Virtual  Provider:   You may see Donato Heinz, MD or one of the following Advanced Practice Providers on your designated Care Team:    Rosaria Ferries, PA-C  Jory Sims, DNP, ANP  Cadence Kathlen Mody, NP   Other Instructions Your cardiac CT will be scheduled at one of the below locations:   [facility] [address] [place], [place] [address] [phone]  [place] [address] [place], [place] [address] [phone]  If scheduled at [facility], please arrive at the [facility] main entrance of [facility] 30-45 minutes prior to test start time. Proceed to the [facility] Radiology Department (first floor) to check-in and test prep.  If scheduled at [facility], please arrive 15 mins early for check-in and test prep.  Please follow these instructions carefully (unless otherwise directed):  Hold all erectile dysfunction medications at least 3 days (72 hrs) prior to test.  On the Night Before the Test: . Be sure to Drink plenty of water. . Do not consume any caffeinated/decaffeinated beverages or chocolate 12 hours prior to your test. . Do not take any antihistamines 12 hours prior to your test. . If the patient has contrast allergy: ? Patient will need a prescription for Prednisone and very clear instructions (as follows): 1. Prednisone 50 mg - take 13 hours prior to test 2. Take another Prednisone 50 mg 7 hours prior to test 3. Take another Prednisone 50 mg 1 hour prior to test 4. Take Benadryl 50 mg 1 hour prior to test . Patient must complete all four doses of above  prophylactic medications. . Patient will need a  ride after test due to Benadryl.  On the Day of the Test: . Drink plenty of water. Do not drink any water within one hour of the test. . Do not eat any food 4 hours prior to the test. . You may take your regular medications prior to the test.  . Take metoprolol (Lopressor) two hours prior to test. . HOLD Furosemide/Hydrochlorothiazide morning of the test. . FEMALES- please wear underwire-free bra if available      After the Test: . Drink plenty of water. . After receiving IV contrast, you may experience a mild flushed feeling. This is normal. . On occasion, you may experience a mild rash up to 24 hours after the test. This is not dangerous. If this occurs, you can take Benadryl 25 mg and increase your fluid intake. . If you experience trouble breathing, this can be serious. If it is severe call 911 IMMEDIATELY. If it is mild, please call our office. . If you take any of these medications: Glipizide/Metformin, Avandament, Glucavance, please do not take 48 hours after completing test unless otherwise instructed.   Once we have confirmed authorization from your insurance company, we will call you to set up a date and time for your test.   For non-scheduling related questions, please contact the cardiac imaging nurse navigator should you have any questions/concerns: Marchia Bond, RN Navigator Cardiac Imaging [facility] Heart and Vascular Services [phone] Office     Signed, Donato Heinz, MD  02/22/2019 4:34 PM    [place]

## 2019-02-22 NOTE — Patient Instructions (Signed)
Medication Instructions:  Dr Gardiner Rhyme recommends that you continue on your current medications as directed. Please refer to the Current Medication list given to you today.  *If you need a refill on your cardiac medications before your next appointment, please call your pharmacy*  Lab Work: Your physician recommends that you return for lab work prior to your Cardiac CT.  If you have labs (blood work) drawn today and your tests are completely normal, you will receive your results only by: Marland Kitchen MyChart Message (if you have MyChart) OR . A paper copy in the mail If you have any lab test that is abnormal or we need to change your treatment, we will call you to review the results.  Testing/Procedures: Your physician has requested that you have cardiac CT. Cardiac computed tomography (CT) is a painless test that uses an x-ray machine to take clear, detailed pictures of your heart. For further information please visit HugeFiesta.tn. Please follow instruction sheet as given.    Follow-Up: At [facility], you and your health needs are our priority.  As part of our continuing mission to provide you with exceptional heart care, we have created designated Provider Care Teams.  These Care Teams include your primary Cardiologist (physician) and Advanced Practice Providers (APPs -  Physician Assistants and Nurse Practitioners) who all work together to provide you with the care you need, when you need it.  Your next appointment:   3 month(s)  The format for your next appointment:   Either In Person or Virtual  Provider:   You may see Donato Heinz, MD or one of the following Advanced Practice Providers on your designated Care Team:    Rosaria Ferries, PA-C  Jory Sims, DNP, ANP  Cadence Kathlen Mody, NP   Other Instructions Your cardiac CT will be scheduled at one of the below locations:   [facility] [address] [place], [place] [address] [phone]  [place] [address] [place], [place] [address] [phone]  If scheduled at [facility], please arrive at the [facility] main entrance of [facility] 30-45 minutes prior to test start time. Proceed to the [facility] Radiology Department (first floor) to check-in and test prep.  If scheduled at [facility], please arrive 15 mins early for check-in and test prep.  Please follow these instructions carefully (unless otherwise directed):  Hold all erectile dysfunction medications at least 3 days (72 hrs) prior to test.  On the Night Before the Test: . Be sure to Drink plenty of water. . Do not consume any caffeinated/decaffeinated beverages or chocolate 12 hours prior to your test. . Do not take any antihistamines 12 hours prior to your test. . If the patient has contrast allergy: ? Patient will need a prescription for Prednisone and very clear instructions (as follows): 1. Prednisone 50 mg - take 13 hours prior to test 2. Take another Prednisone 50 mg 7 hours prior to test 3. Take another Prednisone 50 mg 1 hour prior to test 4. Take Benadryl 50 mg 1 hour prior to test . Patient must complete all four doses of above prophylactic medications. . Patient will need a ride after test due to Benadryl.  On the Day of the Test: . Drink plenty of water. Do not drink any water within one hour of the test. . Do not eat any food 4 hours prior to the test. . You may take your regular  medications prior to the test.  . Take metoprolol (Lopressor) two hours prior to test. . HOLD Furosemide/Hydrochlorothiazide morning of the test. . FEMALES- please wear underwire-free bra if available      After the Test: . Drink plenty of water. . After receiving IV contrast, you may experience a mild flushed feeling. This is normal. . On occasion, you may experience a mild rash up to 24 hours after the  test. This is not dangerous. If this occurs, you can take Benadryl 25 mg and increase your fluid intake. . If you experience trouble breathing, this can be serious. If it is severe call 911 IMMEDIATELY. If it is mild, please call our office. . If you take any of these medications: Glipizide/Metformin, Avandament, Glucavance, please do not take 48 hours after completing test unless otherwise instructed.   Once we have confirmed authorization from your insurance company, we will call you to set up a date and time for your test.   For non-scheduling related questions, please contact the cardiac imaging nurse navigator should you have any questions/concerns: Marchia Bond, RN Navigator Cardiac Imaging [facility] [phone] Office

## 2019-02-23 ENCOUNTER — Other Ambulatory Visit: Payer: Self-pay | Admitting: Cardiology

## 2019-03-04 ENCOUNTER — Telehealth: Payer: Self-pay | Admitting: Family Medicine

## 2019-03-04 NOTE — Telephone Encounter (Signed)
Requested records received from [facility].

## 2019-03-21 ENCOUNTER — Other Ambulatory Visit: Payer: Self-pay | Admitting: Family Medicine

## 2019-03-21 DIAGNOSIS — K219 Gastro-esophageal reflux disease without esophagitis: Secondary | ICD-10-CM

## 2019-04-18 ENCOUNTER — Other Ambulatory Visit: Payer: Self-pay | Admitting: Family Medicine

## 2019-04-18 DIAGNOSIS — K219 Gastro-esophageal reflux disease without esophagitis: Secondary | ICD-10-CM

## 2019-04-29 ENCOUNTER — Encounter: Payer: Self-pay | Admitting: Family Medicine

## 2019-04-29 DIAGNOSIS — R9389 Abnormal findings on diagnostic imaging of other specified body structures: Secondary | ICD-10-CM | POA: Insufficient documentation

## 2019-04-29 DIAGNOSIS — D259 Leiomyoma of uterus, unspecified: Secondary | ICD-10-CM

## 2019-04-29 HISTORY — DX: Leiomyoma of uterus, unspecified: D25.9

## 2019-06-03 ENCOUNTER — Ambulatory Visit: Payer: BLUE CROSS/BLUE SHIELD | Admitting: Cardiology

## 2019-06-07 ENCOUNTER — Encounter: Payer: Self-pay | Admitting: Cardiology

## 2019-06-07 ENCOUNTER — Ambulatory Visit (INDEPENDENT_AMBULATORY_CARE_PROVIDER_SITE_OTHER): Payer: BC Managed Care – PPO | Admitting: Cardiology

## 2019-06-07 ENCOUNTER — Other Ambulatory Visit: Payer: Self-pay

## 2019-06-07 VITALS — BP 120/70 | HR 59 | Temp 97.0°F | Ht 61.0 in | Wt 189.0 lb

## 2019-06-07 DIAGNOSIS — I10 Essential (primary) hypertension: Secondary | ICD-10-CM

## 2019-06-07 DIAGNOSIS — E785 Hyperlipidemia, unspecified: Secondary | ICD-10-CM

## 2019-06-07 DIAGNOSIS — R079 Chest pain, unspecified: Secondary | ICD-10-CM

## 2019-06-07 DIAGNOSIS — I739 Peripheral vascular disease, unspecified: Secondary | ICD-10-CM | POA: Diagnosis not present

## 2019-06-07 NOTE — Patient Instructions (Signed)
Medication Instructions:  Your physician recommends that you continue on your current medications as directed. Please refer to the Current Medication list given to you today.  *If you need a refill on your cardiac medications before your next appointment, please call your pharmacy*   Lab Work: NONE If you have labs (blood work) drawn today and your tests are completely normal, you will receive your results only by: Marland Kitchen MyChart Message (if you have MyChart) OR . A paper copy in the mail If you have any lab test that is abnormal or we need to change your treatment, we will call you to review the results.   Testing/Procedures: Your physician has requested that you have an ankle brachial index (ABI). During this test an ultrasound and blood pressure cuff are used to evaluate the arteries that supply the arms and legs with blood. Allow thirty minutes for this exam. There are no restrictions or special instructions.  Follow-Up: At [facility], you and your health needs are our priority.  As part of our continuing mission to provide you with exceptional heart care, we have created designated Provider Care Teams.  These Care Teams include your primary Cardiologist (physician) and Advanced Practice Providers (APPs -  Physician Assistants and Nurse Practitioners) who all work together to provide you with the care you need, when you need it.  We recommend signing up for the patient portal called "MyChart".  Sign up information is provided on this After Visit Summary.  MyChart is used to connect with patients for Virtual Visits (Telemedicine).  Patients are able to view lab/test results, encounter notes, upcoming appointments, etc.  Non-urgent messages can be sent to your provider as well.   To learn more about what you can do with MyChart, go to [email or web address].    Your next appointment:   6 month(s)  The format for your next appointment:   In Person  Provider:   Oswaldo Milian,  MD

## 2019-06-07 NOTE — Progress Notes (Signed)
Cardiology Office Note:    Date:  06/09/2019   ID:  Amy [name], DOB 03-03-1961, MRN [id number]  PCP:  Girtha Rm, NP-C  Cardiologist:  Donato Heinz, MD  Electrophysiologist:  None   Referring MD: Girtha Rm, NP-C   Chief Complaint  Patient presents with  . Chest Pain   History of Present Illness:    Amy [name] is a 59 y.o. female with a hx of hypertension, prediabetes who presents for follow-up.  She was referred by Harland Dingwall, NP for evaluation of chest pain on 02/22/2019.  She presented to urgent care on 12/13/2018 with chest pain.  She was sent to the ED for evaluation.  High-sensitivity troponin x 1 was negative, but she left before being seen by physician.  She reports that her chest pain has been intermittent.  Described as sharp stabbing pain in center of chest.  Has not noted relationship with exertion.  Occurs about once per month, can last for over an hour.  Sometimes occurs after she eats, but not always.  She denies any shortness of breath.  On initial evaluation on 02/22/2019, coronary CTA was ordered.  She has not had this performed.  States that her chest pain has improved.  She started taking a PPI with improvement in her symptoms.  She denies any shortness of breath.  She does report that she has been getting pain in the back of her legs when she walks.  Past Medical History:  Diagnosis Date  . Endometrial polyp 2016   [facility]  . GERD (gastroesophageal reflux disease)   . Glaucoma   . Hypertension   . Leiomyoma of uterus 04/29/2019  . Prediabetes   . Thickened endometrium 2016   per [facility] records  . Vitamin D deficiency 01/15/2018    Past Surgical History:  Procedure Laterality Date  . CESAREAN SECTION    . DILATION AND CURETTAGE OF UTERUS  2016    Current Medications: Current Meds  Medication Sig  . amLODipine (NORVASC) 10 MG tablet TAKE 1 TABLET(10 MG) BY MOUTH DAILY  . famotidine (PEPCID) 40 MG tablet TAKE  1 TABLET BY MOUTH AT BEDTIME AS NEEDED FOR HEARTBURN OR INDIGESTION  . metoprolol tartrate (LOPRESSOR) 25 MG tablet TAKE 1 TABLET BY MOUTH FOR 1 DOSE 2 HOURS PRIOR TO PROCEDURE  . Multiple Vitamin (MULTIVITAMIN) tablet Take 1 tablet by mouth daily.  Marland Kitchen VITAMIN D, CHOLECALCIFEROL, PO Take by mouth.     Allergies:   Patient has no known allergies.   Social History   Socioeconomic History  . Marital status: Single    Spouse name: Not on file  . Number of children: Not on file  . Years of education: Not on file  . Highest education level: Not on file  Occupational History  . Not on file  Tobacco Use  . Smoking status: Never Smoker  . Smokeless tobacco: Never Used  Substance and Sexual Activity  . Alcohol use: No  . Drug use: No  . Sexual activity: Not on file  Other Topics Concern  . Not on file  Social History Narrative  . Not on file   Social Determinants of Health   Financial Resource Strain:   . Difficulty of Paying Living Expenses: Not on file  Food Insecurity:   . Worried About Charity fundraiser in the Last Year: Not on file  . Ran Out of Food in the Last Year: Not on file  Transportation Needs:   .  Lack of Transportation (Medical): Not on file  . Lack of Transportation (Non-Medical): Not on file  Physical Activity:   . Days of Exercise per Week: Not on file  . Minutes of Exercise per Session: Not on file  Stress:   . Feeling of Stress : Not on file  Social Connections:   . Frequency of Communication with Friends and Family: Not on file  . Frequency of Social Gatherings with Friends and Family: Not on file  . Attends Religious Services: Not on file  . Active Member of Clubs or Organizations: Not on file  . Attends Archivist Meetings: Not on file  . Marital Status: Not on file     Family History: The patient's family history includes Heart failure in her mother; Hypertension in her brother, father, mother, and sister.  ROS:   Please see the  history of present illness.     All other systems reviewed and are negative.  EKGs/Labs/Other Studies Reviewed:    The following studies were reviewed today:   EKG:  EKG is not ordered today.  The ekg last ordered demonstrates sinus rhythm, rate 59, no ST/T abnormalities  Recent Labs: 01/11/2019: ALT 19; BUN 14; Creatinine, Ser 0.77; Hemoglobin 14.6; Platelets 308; Potassium 4.4; Sodium 144; TSH 0.806  Recent Lipid Panel    Component Value Date/Time   CHOL 164 01/11/2019 1010   TRIG 47 01/11/2019 1010   HDL 61 01/11/2019 1010   CHOLHDL 2.7 01/11/2019 1010   LDLCALC 93 01/11/2019 1010    Physical Exam:    VS:  BP 120/70   Pulse (!) 59   Temp (!) 97 F (36.1 C)   Ht 5\' 1"  (1.549 m)   Wt 189 lb (85.7 kg)   SpO2 97%   BMI 35.71 kg/m     Wt Readings from Last 3 Encounters:  06/07/19 189 lb (85.7 kg)  02/22/19 181 lb 4.8 oz (82.2 kg)  01/11/19 177 lb 6.4 oz (80.5 kg)     GEN:  Well nourished, well developed in no acute distress HEENT: Normal NECK: No JVD; No carotid bruits LYMPHATICS: No lymphadenopathy CARDIAC: RRR, 2/6 systolic murmur RESPIRATORY:  Clear to auscultation without rales, wheezing or rhonchi  ABDOMEN: Soft, non-tender, non-distended MUSCULOSKELETAL:  No edema; No deformity  SKIN: Warm and dry NEUROLOGIC:  Alert and oriented x 3 PSYCHIATRIC:  Normal affect   ASSESSMENT:    1. Chest pain of uncertain etiology   2. Claudication ([place])   3. Essential hypertension   4. Hyperlipidemia, unspecified hyperlipidemia type    PLAN:     Chest pain: Atypical in description.  Likely due to GERD, resolved since starting PPI.  Will hold off on further ischemia evaluation for now.  If having recurrence of chest pain, can plan for coronary CTA for further evaluation  Hypertension: On amlodipine 10 mg daily.  Appears controlled  Hyperlipidemia: LDL 93 on 01/11/2019.  10-year ASCVD risk score is 5.7%.  Does not meet indication for statin at this  time  Claudication: will check ABIs  RTC in 6 months  Medication Adjustments/Labs and Tests Ordered: Current medicines are reviewed at length with the patient today.  Concerns regarding medicines are outlined above.  Orders Placed This Encounter  Procedures  . VAS [place] ABI WITH/WO TBI  . VAS [place] LOWER EXTREMITY ARTERIAL DUPLEX   No orders of the defined types were placed in this encounter.   Patient Instructions  Medication Instructions:  Your physician recommends that you continue on  your current medications as directed. Please refer to the Current Medication list given to you today.  *If you need a refill on your cardiac medications before your next appointment, please call your pharmacy*   Lab Work: NONE If you have labs (blood work) drawn today and your tests are completely normal, you will receive your results only by: Marland Kitchen MyChart Message (if you have MyChart) OR . A paper copy in the mail If you have any lab test that is abnormal or we need to change your treatment, we will call you to review the results.   Testing/Procedures: Your physician has requested that you have an ankle brachial index (ABI). During this test an ultrasound and blood pressure cuff are used to evaluate the arteries that supply the arms and legs with blood. Allow thirty minutes for this exam. There are no restrictions or special instructions.  Follow-Up: At [facility], you and your health needs are our priority.  As part of our continuing mission to provide you with exceptional heart care, we have created designated Provider Care Teams.  These Care Teams include your primary Cardiologist (physician) and Advanced Practice Providers (APPs -  Physician Assistants and Nurse Practitioners) who all work together to provide you with the care you need, when you need it.  We recommend signing up for the patient portal called "MyChart".  Sign up information is provided on this After Visit Summary.  MyChart is used  to connect with patients for Virtual Visits (Telemedicine).  Patients are able to view lab/test results, encounter notes, upcoming appointments, etc.  Non-urgent messages can be sent to your provider as well.   To learn more about what you can do with MyChart, go to [email or web address].    Your next appointment:   6 month(s)  The format for your next appointment:   In Person  Provider:   Oswaldo Milian, MD         Signed, Donato Heinz, MD  06/09/2019 2:02 PM    [place]

## 2019-06-10 ENCOUNTER — Other Ambulatory Visit: Payer: Self-pay | Admitting: Family Medicine

## 2019-06-10 DIAGNOSIS — I10 Essential (primary) hypertension: Secondary | ICD-10-CM

## 2019-06-10 NOTE — Telephone Encounter (Signed)
Is this ok to refill or does this need to come from cardiology

## 2019-06-10 NOTE — Telephone Encounter (Signed)
Please let her know that this should come from cardiology. Thanks.

## 2019-06-10 NOTE — Telephone Encounter (Signed)
Advised pt's son to contact cardiology about refill

## 2019-06-11 ENCOUNTER — Other Ambulatory Visit: Payer: Self-pay | Admitting: Cardiology

## 2019-06-11 DIAGNOSIS — I10 Essential (primary) hypertension: Secondary | ICD-10-CM

## 2019-06-14 ENCOUNTER — Encounter: Payer: Self-pay | Admitting: Family Medicine

## 2019-06-14 ENCOUNTER — Ambulatory Visit (INDEPENDENT_AMBULATORY_CARE_PROVIDER_SITE_OTHER): Payer: BC Managed Care – PPO | Admitting: Family Medicine

## 2019-06-14 ENCOUNTER — Other Ambulatory Visit: Payer: Self-pay

## 2019-06-14 VITALS — BP 126/70 | HR 65 | Temp 97.8°F | Wt 189.4 lb

## 2019-06-14 DIAGNOSIS — M5441 Lumbago with sciatica, right side: Secondary | ICD-10-CM

## 2019-06-14 DIAGNOSIS — G8929 Other chronic pain: Secondary | ICD-10-CM | POA: Diagnosis not present

## 2019-06-14 MED ORDER — DICLOFENAC SODIUM 75 MG PO TBEC: 75.0000 mg | DELAYED_RELEASE_TABLET | Freq: Two times a day (BID) | ORAL | 0 refills | Status: DC

## 2019-06-14 NOTE — Progress Notes (Signed)
   Subjective:    Patient ID: Amy [name], female    DOB: Dec 22, 1960, 59 y.o.   MRN: [id number]  HPI Chief Complaint  Patient presents with  . hip pain to all the way down    hip pain on both sides and goes all the way down. got steriod injection back in october for this but no relief.    She is here with complaints of mid line low back pain, worse on the right with radiation down the posterior right leg. She has a history of similar pain. Has been under the care of her orthopedist. States she did not call them.  She had a steroid injection by Dr. Ernestina Patches on 12/14/2018. She did get relief but now her pain is worse. She did not follow up with him.  Denies fever, chills, chest pain, shortness of breath, abdominal pain, N/V/D. No loss of control of bowels or bladder. Denies weakness.   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  Mild degenerative change L3-4 and L4-5.   She has bilateral LE vascular US ordered on 06/21/2019 by her cardiologist for claudication.   No other concerns today.   Review of Systems Pertinent positives and negatives in the history of present illness.     Objective:   Physical Exam Constitutional:      General: She is not in acute distress.    Appearance: She is not ill-appearing.  Cardiovascular:     Rate and Rhythm: Normal rate and regular rhythm.     Pulses: Normal pulses.  Pulmonary:     Effort: Pulmonary effort is normal.     Breath sounds: Normal breath sounds.  Musculoskeletal:     Cervical back: Normal range of motion and neck supple.     Lumbar back: No spasms or tenderness. Positive right straight leg raise test.  Skin:    General: Skin is warm and dry.  Neurological:     Mental Status: She is alert and oriented to person, place, and time.     Cranial Nerves: Cranial nerves are intact.     Sensory: Sensation is intact.     Motor: Motor function is  intact.     Gait: Gait is intact.     Deep Tendon Reflexes:     Reflex Scores:      Patellar reflexes are 1+ on the right side and 1+ on the left side.      Achilles reflexes are 1+ on the right side and 1+ on the left side.   BP 126/70   Pulse 65   Temp 97.8 F (36.6 C)   Wt 189 lb 6.4 oz (85.9 kg)   BMI 35.79 kg/m       Assessment & Plan:  Chronic midline low back pain with right-sided sciatica - Plan: diclofenac (VOLTAREN) 75 MG EC tablet  No red flag symptoms. She reports diclofenac has helped in the past. I will prescribe this for her and have her follow up with Dr. Ernestina Patches. I provided her with his number. I also reminded her that she has LE Dopplers scheduled for March 19th for claudication symptoms, ordered by her cardiologist.

## 2019-06-14 NOTE — Patient Instructions (Signed)
I will prescribe diclofenac (anti inflammatory) pain medication for you today and the please follow up with Dr. Ernestina Patches as he recommended.  You have some degenerative changes in your lower back, severe degeneration and foraminal stenosis L5-S1.   Also, you have lower vascular ultrasound ordered by your cardiologist. These are scheduled for March 19th.

## 2019-06-21 ENCOUNTER — Ambulatory Visit (HOSPITAL_COMMUNITY): Payer: BC Managed Care – PPO

## 2019-06-21 ENCOUNTER — Other Ambulatory Visit: Payer: Self-pay | Admitting: Family Medicine

## 2019-06-21 DIAGNOSIS — K219 Gastro-esophageal reflux disease without esophagitis: Secondary | ICD-10-CM

## 2019-06-27 ENCOUNTER — Other Ambulatory Visit: Payer: Self-pay | Admitting: Family Medicine

## 2019-06-27 DIAGNOSIS — G8929 Other chronic pain: Secondary | ICD-10-CM

## 2019-06-27 NOTE — Telephone Encounter (Signed)
Tried to call to find out if she went to Dr. Ernestina Patches for a follow-up

## 2019-06-28 ENCOUNTER — Ambulatory Visit: Payer: BC Managed Care – PPO | Attending: Internal Medicine

## 2019-06-28 DIAGNOSIS — Z23 Encounter for immunization: Secondary | ICD-10-CM

## 2019-06-28 NOTE — Telephone Encounter (Signed)
Left message for pt to call me back 

## 2019-06-28 NOTE — Progress Notes (Signed)
   Covid-19 Vaccination Clinic  Name:  Amy [name]    MRN: [id number] DOB: Jun 06, 1960  06/28/2019  [name] was observed post Covid-19 immunization for 15 minutes without incident. She was provided with Vaccine Information Sheet and instruction to access the V-Safe system.   [name] was instructed to call 911 with any severe reactions post vaccine: Marland Kitchen Difficulty breathing  . Swelling of face and throat  . A fast heartbeat  . A bad rash all over body  . Dizziness and weakness   Immunizations Administered    Name Date Dose VIS Date Route   Pfizer COVID-19 Vaccine 06/28/2019  2:32 PM 0.3 mL 03/15/2019 Intramuscular   Manufacturer: [place]   Lot: [id number]   [place]: [id number]

## 2019-07-05 ENCOUNTER — Ambulatory Visit (HOSPITAL_COMMUNITY)
Admission: RE | Admit: 2019-07-05 | Discharge: 2019-07-05 | Disposition: A | Discharge status: Home / Self Care | Payer: BC Managed Care – PPO | Source: Ambulatory Visit | Attending: Cardiology | Admitting: Cardiology

## 2019-07-05 ENCOUNTER — Other Ambulatory Visit: Payer: Self-pay

## 2019-07-05 DIAGNOSIS — I739 Peripheral vascular disease, unspecified: Secondary | ICD-10-CM | POA: Diagnosis not present

## 2019-07-08 ENCOUNTER — Telehealth: Payer: Self-pay

## 2019-07-08 NOTE — Telephone Encounter (Signed)
Patient son (Dr. Eileen Stanford) and stated his mom wants to know if she can request a work note for accomodation for frequent breaks and a chair to sit on while at work due to her spinal stenosis (lower back pian). Please advise.

## 2019-07-09 NOTE — Telephone Encounter (Signed)
Lmom informing that whomever is seeing patient for her back pain would be the ones to speak to to inquire about writing a work note.

## 2019-07-09 NOTE — Telephone Encounter (Signed)
This would be best decided by whoever is treating her back pain. Does she have an orthopedist or back specialist who is treating her pain currently?

## 2019-07-19 ENCOUNTER — Telehealth: Payer: Self-pay | Admitting: Orthopedic Surgery

## 2019-07-19 NOTE — Telephone Encounter (Signed)
Note generated will call to advise

## 2019-07-19 NOTE — Telephone Encounter (Signed)
Patient's son called.   His mother is having pain in her back still so they are requesting she be given a note for work. They want the job to allow her to sit in a chair.    Call back: [phone]

## 2019-07-19 NOTE — Telephone Encounter (Signed)
Pls advise. Thanks.  

## 2019-07-22 NOTE — Telephone Encounter (Signed)
IC advised could pick up at front desk.  

## 2019-07-23 ENCOUNTER — Ambulatory Visit: Payer: BC Managed Care – PPO | Attending: Internal Medicine

## 2019-07-23 DIAGNOSIS — Z23 Encounter for immunization: Secondary | ICD-10-CM

## 2019-07-23 NOTE — Progress Notes (Signed)
   Covid-19 Vaccination Clinic  Name:  [name]    MRN: [id number] DOB: [birth year]  07/23/2019  [name] was observed post Covid-19 immunization for 15 minutes without incident. She was provided with Vaccine Information Sheet and instruction to access the V-Safe system.   [name] was instructed to call 911 with any severe reactions post vaccine: Marland Kitchen Difficulty breathing  . Swelling of face and throat  . A fast heartbeat  . A bad rash all over body  . Dizziness and weakness   Immunizations Administered    Name Date Dose VIS Date Route   Pfizer COVID-19 Vaccine 07/23/2019  3:49 PM 0.3 mL 05/29/2018 Intramuscular   Manufacturer: [place]   Lot: [id number]   [place]: [id number]

## 2019-08-01 DIAGNOSIS — H401132 Primary open-angle glaucoma, bilateral, moderate stage: Secondary | ICD-10-CM | POA: Diagnosis not present

## 2019-08-07 ENCOUNTER — Ambulatory Visit: Payer: BC Managed Care – PPO | Admitting: Physical Medicine and Rehabilitation

## 2019-08-28 ENCOUNTER — Ambulatory Visit (INDEPENDENT_AMBULATORY_CARE_PROVIDER_SITE_OTHER): Payer: BC Managed Care – PPO | Admitting: Orthopedic Surgery

## 2019-08-28 ENCOUNTER — Other Ambulatory Visit: Payer: Self-pay

## 2019-08-28 ENCOUNTER — Encounter: Payer: Self-pay | Admitting: Orthopedic Surgery

## 2019-08-28 VITALS — Ht 61.0 in | Wt 189.0 lb

## 2019-08-28 DIAGNOSIS — M5441 Lumbago with sciatica, right side: Secondary | ICD-10-CM

## 2019-08-28 DIAGNOSIS — G8929 Other chronic pain: Secondary | ICD-10-CM | POA: Diagnosis not present

## 2019-08-28 DIAGNOSIS — M5442 Lumbago with sciatica, left side: Secondary | ICD-10-CM

## 2019-09-01 ENCOUNTER — Encounter: Payer: Self-pay | Admitting: Orthopedic Surgery

## 2019-09-01 NOTE — Progress Notes (Signed)
Office Visit Note   [name]           Date of Birth: 09/10/1960           MRN: [id number] Visit Date: 08/28/2019 Requested by: Girtha Rm, NP-C [place],  [place] [address] PCP: Girtha Rm, NP-C  Subjective: Chief Complaint  Patient presents with  . Lower Back - Pain, Follow-up    HPI: Amy [name] is a 59 year old patient who presents for follow-up of hip pain.  In March and April she was out of work due to this pain.  Note is provided for arch 19th April 5.  Her son who is a physician here at [facility] is present during her visit to facilitate communication.  Reports continued low back pain.  She tried diclofenac which did not give her much relief.  Injections only gave her 1 to 2 days of relief.  She has been able to change her work situation in terms of sitting on a stool at times.  She does have pain which radiates down the right leg.  She does report some leg numbness with sitting.  First injection 2019 gave her.  One  month of relief.  Second shot October 2020 1 week of relief..              ROS: All systems reviewed are negative as they relate to the chief complaint within the history of present illness.  Patient denies  fevers or chills.   Assessment & Plan: Visit Diagnoses:  1. Chronic midline low back pain with bilateral sciatica     Plan: Impression is pretty significant facet arthritis in a patient who is having increasing pain and symptoms.  I think she would be a good candidate for ablation treatment of the facet joints.  I will send her back to Dr. Ernestina Patches for consideration of that intervention.  She has had noticeable but short-lived relief from injections in the past.  Does not really want to consider surgical intervention which is understandable.  Her son is very adamant about avoiding surgical intervention.  Therapeutic exercises also encouraged.  Follow-up with Dr. Ernestina Patches next week.  Follow-Up Instructions: No follow-ups on file.    Orders:  Orders Placed This Encounter  Procedures  . Ambulatory referral to Physical Medicine Rehab   No orders of the defined types were placed in this encounter.     Procedures: No procedures performed   Clinical Data: No additional findings.  Objective: Vital Signs: Ht 5\' 1"  (1.549 m)   Wt 189 lb (85.7 kg)   BMI 35.71 kg/m   Physical Exam:   Constitutional: Patient appears well-developed HEENT:  Head: Normocephalic Eyes:EOM are normal Neck: Normal range of motion Cardiovascular: Normal rate Pulmonary/chest: Effort normal Neurologic: Patient is alert Skin: Skin is warm Psychiatric: Patient has normal mood and affect    Ortho Exam: Ortho exam demonstrates normal gait alignment.  No nerve root tension signs today.  No groin pain with internal X rotation of the leg.  No muscle atrophy.  Patient has 5 out of 5 ankle dorsiflexion plantarflexion abduction adduction and hip flexion strength.  Pedal pulses palpable.  No definite paresthesias L1 S1 bilaterally.  Specialty Comments:  No specialty comments available.  Imaging: No results found.   PMFS History: Patient Active Problem List   Diagnosis Date Noted  . Leiomyoma of uterus 04/29/2019  . Thickened endometrium   . Family history of heart disease 01/11/2019  . History of chest pain 01/11/2019  .  Vitamin D deficiency 01/15/2018  . Prediabetes 01/03/2018  . Gastroesophageal reflux disease 01/03/2018  . Essential hypertension 01/03/2018  . Chronic bilateral low back pain without sciatica 09/20/2017  . Endometrial polyp 2016  . ABDOMINAL BLOATING 03/23/2010   Past Medical History:  Diagnosis Date  . Endometrial polyp 2016   [facility]  . GERD (gastroesophageal reflux disease)   . Glaucoma   . Hypertension   . Leiomyoma of uterus 04/29/2019  . Prediabetes   . Thickened endometrium 2016   per [facility]  . Vitamin D deficiency 01/15/2018    Family History  Problem Relation Age  of Onset  . Hypertension Mother   . Heart failure Mother   . Hypertension Father   . Hypertension Sister   . Hypertension Brother     Past Surgical History:  Procedure Laterality Date  . CESAREAN SECTION    . DILATION AND CURETTAGE OF UTERUS  2016   Social History   Occupational History  . Not on file  Tobacco Use  . Smoking status: Never Smoker  . Smokeless tobacco: Never Used  Substance and Sexual Activity  . Alcohol use: No  . Drug use: No  . Sexual activity: Not on file

## 2019-09-12 ENCOUNTER — Telehealth: Payer: Self-pay | Admitting: Physical Medicine and Rehabilitation

## 2019-09-12 NOTE — Telephone Encounter (Signed)
Pt scheduled 09/25/19

## 2019-09-12 NOTE — Telephone Encounter (Signed)
patient's daughter called. Would like a call back to schedule patient with Dr. Ernestina Patches.

## 2019-09-19 ENCOUNTER — Other Ambulatory Visit: Payer: Self-pay | Admitting: Family Medicine

## 2019-09-19 DIAGNOSIS — I10 Essential (primary) hypertension: Secondary | ICD-10-CM

## 2019-09-20 ENCOUNTER — Telehealth: Payer: Self-pay | Admitting: Cardiology

## 2019-09-20 ENCOUNTER — Other Ambulatory Visit: Payer: Self-pay | Admitting: Cardiology

## 2019-09-20 DIAGNOSIS — I10 Essential (primary) hypertension: Secondary | ICD-10-CM

## 2019-09-20 MED ORDER — AMLODIPINE BESYLATE 10 MG PO TABS: 10.0000 mg | ORAL_TABLET | Freq: Every day | ORAL | 2 refills | Status: DC

## 2019-09-20 NOTE — Telephone Encounter (Signed)
    Pt c/o medication issue:  1. Name of Medication: hydrochlorothiazide 12.5 mg  2. How are you currently taking this medication (dosage and times per day)?   3. Are you having a reaction (difficulty breathing--STAT)?   4. What is your medication issue? Pt's son Obed DPR on file said pt is taking this medication with amlodipine, he said Dr. Gardiner Rhyme prescribed it, it's not on pt's medication list and would like to clarify if pt needs to take this meds

## 2019-09-20 NOTE — Telephone Encounter (Signed)
We did not have HCTZ on her med list, just amlodipine for her blood pressure.  Did her PCP prescribe this?  Would recommend discussing with PCP

## 2019-09-20 NOTE — Telephone Encounter (Signed)
Left detailed message for Obed (ok per DPR). Advised to call back with questions

## 2019-09-20 NOTE — Telephone Encounter (Signed)
Spoke to patient's son Obed he stated mother has been taking HCTZ 12.5 mg daily.Stated she needs a refill.Advised I do not see HCTZ list on her medication list.I will send message to Dr.Schumann to make sure ok to refill.

## 2019-09-20 NOTE — Telephone Encounter (Signed)
   *  STAT* If patient is at the pharmacy, call can be transferred to refill team.   1. Which medications need to be refilled? (please list name of each medication and dose if known)   amLODipine (NORVASC) 10 MG tablet    2. Which pharmacy/location (including street and city if local pharmacy) is medication to be sent to? [id number] - [place], [place] - [address]  3. Do they need a 30 day or 90 day supply? 90 days  

## 2019-09-23 ENCOUNTER — Telehealth: Payer: Self-pay

## 2019-09-23 MED ORDER — HYDROCHLOROTHIAZIDE 12.5 MG PO TABS
12.5000 mg | ORAL_TABLET | Freq: Every day | ORAL | 1 refills | Status: DC
Start: 2019-09-23 — End: 2020-03-23

## 2019-09-23 NOTE — Telephone Encounter (Signed)
Pt. Son called stating that his mom is having trouble getting her hctz refilled not sure why it is not on her current medication list. She has been out of since last Thursday needs it refilled to the [facility] on [address]. Pt. Last apt was 06/14/19.

## 2019-09-23 NOTE — Telephone Encounter (Signed)
Pt has been taking hctz every morning and amlodpine every evening. Metoprolol was a one time dose

## 2019-09-23 NOTE — Telephone Encounter (Signed)
Please advise as HCTZ is not on current med list and per cardiology note, was advise to discuss with pcp

## 2019-09-23 NOTE — Telephone Encounter (Signed)
I see that I was prescribing HCTZ for her and not sure why it fell off her medication list. Does she take it daily or how often? Is she only taking amlodipine now or the beta blocker they prescribed as well? We just need to clear this up and I apologize for the confusion but not sure what happened.

## 2019-09-25 ENCOUNTER — Telehealth: Payer: Self-pay | Admitting: *Deleted

## 2019-09-25 ENCOUNTER — Ambulatory Visit (INDEPENDENT_AMBULATORY_CARE_PROVIDER_SITE_OTHER): Payer: BC Managed Care – PPO | Admitting: Physical Medicine and Rehabilitation

## 2019-09-25 ENCOUNTER — Other Ambulatory Visit: Payer: Self-pay

## 2019-09-25 ENCOUNTER — Encounter: Payer: Self-pay | Admitting: Physical Medicine and Rehabilitation

## 2019-09-25 VITALS — BP 145/99 | HR 56 | Ht 62.0 in | Wt 189.0 lb

## 2019-09-25 DIAGNOSIS — M47816 Spondylosis without myelopathy or radiculopathy, lumbar region: Secondary | ICD-10-CM

## 2019-09-25 DIAGNOSIS — M48061 Spinal stenosis, lumbar region without neurogenic claudication: Secondary | ICD-10-CM

## 2019-09-25 NOTE — Progress Notes (Signed)
Pt states pain in the middle of the lower back that radiates into the back of the of both legs all the way down. Pain started more than 6 months ago. Sitting and standing for too long makes pain worse. Nothing helps with pain.   .Numeric Pain Rating Scale and Functional Assessment Average Pain 10 Pain Right Now 7 My pain is constant, stabbing and aching Pain is worse with: sitting and standing Pain improves with: nothing   In the last MONTH (on 0-10 scale) has pain interfered with the following?  1. General activity like being  able to carry out your everyday physical activities such as walking, climbing stairs, carrying groceries, or moving a chair?  Rating(10)  2. Relation with others like being able to carry out your usual social activities and roles such as  activities at home, at work and in your community. Rating(10)  3. Enjoyment of life such that you have  been bothered by emotional problems such as feeling anxious, depressed or irritable?  Rating(4)

## 2019-09-25 NOTE — Telephone Encounter (Signed)
From what it looks like, Dr Marlou Sa did not take her out of work, only gave her a note to be allowed to perform sedentary duties.

## 2019-09-26 ENCOUNTER — Ambulatory Visit (INDEPENDENT_AMBULATORY_CARE_PROVIDER_SITE_OTHER): Payer: BC Managed Care – PPO | Admitting: Physical Medicine and Rehabilitation

## 2019-09-26 ENCOUNTER — Ambulatory Visit: Payer: Self-pay

## 2019-09-26 ENCOUNTER — Encounter: Payer: Self-pay | Admitting: Physical Medicine and Rehabilitation

## 2019-09-26 VITALS — BP 124/75 | HR 62

## 2019-09-26 DIAGNOSIS — M47816 Spondylosis without myelopathy or radiculopathy, lumbar region: Secondary | ICD-10-CM | POA: Diagnosis not present

## 2019-09-26 MED ORDER — BUPIVACAINE HCL 0.5 % IJ SOLN
3.0000 mL | Freq: Once | INTRAMUSCULAR | Status: AC
  Administered 2019-09-26: 3 mL

## 2019-09-26 NOTE — Telephone Encounter (Signed)
Please read message below to pt for her appt this afternoon.

## 2019-09-26 NOTE — Progress Notes (Signed)
Pt states pain is in the lower back. Pt states the right side is worse. Pt states nothing helps pain. Pt states sitting and standing long periods increases pain.  Numeric Pain Rating Scale and Functional Assessment Average Pain 8   In the last MONTH (on 0-10 scale) has pain interfered with the following?  1. General activity like being  able to carry out your everyday physical activities such as walking, climbing stairs, carrying groceries, or moving a chair?  Rating(8)   +Driver, -BT, -Dye Allergies.

## 2019-09-26 NOTE — Procedures (Signed)
Lumbar Diagnostic Facet Joint Nerve Block with Fluoroscopic Guidance   [name]      Date of Birth: Mar 21, 1961 MRN: [id number] PCP: Girtha Rm, NP-C      Visit Date: 09/26/2019   Universal Protocol:    Date/Time: 06/24/211:30 PM  Consent Given By: the patient  Position: PRONE  Additional Comments: Vital signs were monitored before and after the procedure. Patient was prepped and draped in the usual sterile fashion. The correct patient, procedure, and site was verified.   Injection Procedure Details:  Procedure Site One Meds Administered:  Meds ordered this encounter  Medications  . bupivacaine (MARCAINE) 0.5 % (with pres) injection 3 mL     Laterality: Bilateral  Location/Site:  L4-L5  Needle size: 22 ga.  Needle type:spinal  Needle Placement: Oblique pedical  Findings:   -Comments: There was excellent flow of contrast along the articular pillars without intravascular flow.  Procedure Details: The fluoroscope beam is vertically oriented in AP and then obliqued 15 to 20 degrees to the ipsilateral side of the desired nerve to achieve the "Scotty dog" appearance.  The skin over the target area of the junction of the superior articulating process and the transverse process (sacral ala if blocking the L5 dorsal rami) was locally anesthetized with a 1 ml volume of 1% Lidocaine without Epinephrine.  The spinal needle was inserted and advanced in a trajectory view down to the target.   After contact with periosteum and negative aspirate for blood and CSF, correct placement without intravascular or epidural spread was confirmed by injecting 0.5 ml. of Isovue-250.  A spot radiograph was obtained of this image.    Next, a 0.5 ml. volume of the injectate described above was injected. The needle was then redirected to the other facet joint nerves mentioned above if needed.  Prior to the procedure, the patient was given a Pain Diary which was completed for  baseline measurements.  After the procedure, the patient rated their pain every 30 minutes and will continue rating at this frequency for a total of 5 hours.  The patient has been asked to complete the Diary and return to [place] by mail, fax or hand delivered as soon as possible.   Additional Comments:  The patient tolerated the procedure well Dressing: 2 x 2 sterile gauze and Band-Aid    Post-procedure details: Patient was observed during the procedure. Post-procedure instructions were reviewed.  Patient left the clinic in stable condition.

## 2019-09-26 NOTE — Progress Notes (Signed)
Amy [name] - 59 y.o. female MRN [id number]  Date of birth: 02-Jun-1960  Office Visit Note: Visit Date: 09/25/2019 PCP: Girtha Rm, NP-C Referred by: Girtha Rm, NP-C  Subjective: No chief complaint on file.  HPI: Amy [name] is a 59 y.o. female who comes in today For evaluation and management of chronic worsening mostly axial low back pain.  She is followed in the office by Dr. Anderson Malta from an orthopedic standpoint and his notes were reviewed once again.  I have seen the patient on 2 occasions 1 was a diagnostic facet joint block at L5-S1 with good diagnostic relief with over 80% relief for several weeks.  Ultimately there was some delay in seeing her back and of course there was the issue with the coronavirus pandemic.  We ultimately saw her for lumbar epidural injection which did not seem to help very much.  She has had physical therapy and medication management without much relief.  She reports pain across the lower back worse going from sit to stand and standing.  Pain is been ongoing for several years with 6 months of worsening symptoms.  If she stands for very long she does get some referral down the posterior lateral legs to about the knee.  She reports no paresthesias no focal weakness nothing into the feet.  She has a constant low back pain but when its worse it would be stabbing and aching.  She rates her average pain is a 10 out of 10.  She reports that it really affects her daily living as she just cannot do what she needs to do at this point.  She has had good conservative care through Dr. Anderson Malta.  She has no history of lumbar surgery.  She is present today I believe with her brother who is a internal medicine physician.  Review of Systems  Constitutional: Negative for chills, fever, malaise/fatigue and weight loss.  HENT: Negative for hearing loss and sinus pain.   Eyes: Negative for blurred vision, double vision and photophobia.  Respiratory: Negative for  cough and shortness of breath.   Cardiovascular: Negative for chest pain, palpitations and leg swelling.  Gastrointestinal: Negative for abdominal pain, nausea and vomiting.  Genitourinary: Negative for flank pain.  Musculoskeletal: Positive for back pain. Negative for myalgias.  Skin: Negative for itching and rash.  Neurological: Negative for tremors, focal weakness and weakness.  Endo/Heme/Allergies: Negative.   Psychiatric/Behavioral: Negative for depression.  All other systems reviewed and are negative.  Otherwise per HPI.  Assessment & Plan: Visit Diagnoses:  1. Spondylosis without myelopathy or radiculopathy, lumbar region   2. Stenosis of lateral recess of lumbar spine   3. Foraminal stenosis of lumbar region     Plan: Findings:  Chronic low back pain with 6 months of exacerbation and worsening with 10 out of 10 pain and affecting her daily living.  She has not had any relief with conservative care including medication management and therapy.  This is been ongoing now for a couple of years with 6 months of worsening.  She has no real radicular pain except for some pain into the bilateral thighs.  MRI findings were reviewed today and reviewed below again as well.  She has a normal-appearing spine except for L5-S1 with severe facet arthropathy and some lateral recess narrowing.  No high-grade central stenosis.  Diagnostically she did well with facet joint block with great relief for a few weeks.  Nothing else has helped her pain.  We have completed epidural injection without any relief.  Neck step is diagnostic facet joint block once again for a double block paradigm looking at radiofrequency ablation for hopefully more long-term relief.  She will continue to follow with orthopedic standpoint with Dr. Anderson Malta.    Meds & Orders: No orders of the defined types were placed in this encounter.  No orders of the defined types were placed in this encounter.   Follow-up: Return for  Bilateral L5-S1 facet/medial branch block..   Procedures: No procedures performed  No notes on file   Clinical History: MRI LUMBAR SPINE WITHOUT CONTRAST  TECHNIQUE: Multiplanar, multisequence MR imaging of the lumbar spine was performed. No intravenous contrast was administered.  COMPARISON:  None.  FINDINGS: Segmentation:  Normal  Alignment:  Normal  Vertebrae:  Normal bone marrow.  Negative for fracture or mass.  Conus medullaris and cauda equina: Conus extends to the L1-2 level. Conus and cauda equina appear normal.  Paraspinal and other soft tissues: Negative for paraspinous mass or fluid collection  Disc levels:  L1-2: Negative  L2-3: Negative  L3-4: Mild disc and facet degeneration without significant stenosis  L4-5: Mild disc and mild facet degeneration. Mild subarticular stenosis bilaterally  L5-S1: Severe facet degeneration bilaterally with facet joint effusions and diffuse facet hypertrophy. Mild disc degeneration. Severe subarticular and foraminal stenosis with bilateral L5 and S1 nerve root impingement.  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  Mild degenerative change L3-4 and L4-5.   Electronically Signed   By: Franchot Gallo M.D.   On: 10/15/2017 10:28   She reports that she has never smoked. She has never used smokeless tobacco.  Recent Labs    01/11/19 1100  HGBA1C 6.2*    Objective:  VS:  HT:5\' 2"  (157.5 cm)   WT:189 lb (85.7 kg)  BMI:34.56    BP:(!) 145/99  HR:(!) 56bpm  TEMP: ( )  RESP:  Physical Exam Constitutional:      General: She is not in acute distress.    Appearance: Normal appearance. She is not ill-appearing.  HENT:     Head: Normocephalic and atraumatic.     Right Ear: External ear normal.     Left Ear: External ear normal.  Eyes:     Extraocular Movements: Extraocular movements intact.  Cardiovascular:     Rate and Rhythm:  Normal rate.     Pulses: Normal pulses.  Musculoskeletal:     Right lower leg: No edema.     Left lower leg: No edema.     Comments: Patient has good distal strength with no pain over the greater trochanters.  No clonus or focal weakness.  She has pain going from sit to stand and with extension and facet loading bilaterally.  Skin:    Findings: No erythema, lesion or rash.  Neurological:     General: No focal deficit present.     Mental Status: She is alert and oriented to person, place, and time.     Sensory: No sensory deficit.     Motor: No weakness or abnormal muscle tone.     Coordination: Coordination normal.  Psychiatric:        Mood and Affect: Mood normal.        Behavior: Behavior normal.     Ortho Exam  Imaging: No results found.  Past Medical/Family/Surgical/Social History: Medications & Allergies reviewed per EMR, new medications updated. Patient Active Problem List   Diagnosis Date Noted  .  Leiomyoma of uterus 04/29/2019  . Thickened endometrium   . Family history of heart disease 01/11/2019  . History of chest pain 01/11/2019  . Vitamin D deficiency 01/15/2018  . Prediabetes 01/03/2018  . Gastroesophageal reflux disease 01/03/2018  . Essential hypertension 01/03/2018  . Chronic bilateral low back pain without sciatica 09/20/2017  . Endometrial polyp 2016  . ABDOMINAL BLOATING 03/23/2010   Past Medical History:  Diagnosis Date  . Endometrial polyp 2016   [facility]  . GERD (gastroesophageal reflux disease)   . Glaucoma   . Hypertension   . Leiomyoma of uterus 04/29/2019  . Prediabetes   . Thickened endometrium 2016   per [facility] records  . Vitamin D deficiency 01/15/2018   Family History  Problem Relation Age of Onset  . Hypertension Mother   . Heart failure Mother   . Hypertension Father   . Hypertension Sister   . Hypertension Brother    Past Surgical History:  Procedure Laterality Date  . CESAREAN SECTION    . DILATION  AND CURETTAGE OF UTERUS  2016   Social History   Occupational History  . Not on file  Tobacco Use  . Smoking status: Never Smoker  . Smokeless tobacco: Never Used  Substance and Sexual Activity  . Alcohol use: No  . Drug use: No  . Sexual activity: Not on file

## 2019-09-26 NOTE — Progress Notes (Signed)
Amy [name] - 59 y.o. female MRN [id number]  Date of birth: 1960-05-16  Office Visit Note: Visit Date: 09/26/2019 PCP: Girtha Rm, NP-C Referred by: Girtha Rm, NP-C  Subjective: Chief Complaint  Patient presents with  . Lower Back - Pain   HPI:  Amy [name] is a 59 y.o. female who comes in today for planned repeat Bilateral L4-L5 lumbar facet/medial branch block with fluoroscopic guidance.  The patient has failed conservative care including home exercise, medications, time and activity modification.  This injection will be diagnostic and hopefully therapeutic.  Please see requesting physician notes for further details and justification.  Exam shows concordant low back pain with facet joint loading and extension. Patient received more than 80% pain relief from prior injection.     Referring:Dr. Landry Dyke Dean    ROS Otherwise per HPI.  Assessment & Plan: Visit Diagnoses:  1. Spondylosis without myelopathy or radiculopathy, lumbar region     Plan: No additional findings.   Meds & Orders:  Meds ordered this encounter  Medications  . bupivacaine (MARCAINE) 0.5 % (with pres) injection 3 mL    Orders Placed This Encounter  Procedures  . Facet Injection  . XR C-ARM NO REPORT    Follow-up: Return for Review Pain Diary.   Procedures: No procedures performed  Lumbar Diagnostic Facet Joint Nerve Block with Fluoroscopic Guidance   [name]      Date of Birth: 1960/08/11 MRN: [id number] PCP: Girtha Rm, NP-C      Visit Date: 09/26/2019   Universal Protocol:    Date/Time: 06/24/211:30 PM  Consent Given By: the patient  Position: PRONE  Additional Comments: Vital signs were monitored before and after the procedure. Patient was prepped and draped in the usual sterile fashion. The correct patient, procedure, and site was verified.   Injection Procedure Details:  Procedure Site One Meds Administered:  Meds ordered this encounter    Medications  . bupivacaine (MARCAINE) 0.5 % (with pres) injection 3 mL     Laterality: Bilateral  Location/Site:  L4-L5  Needle size: 22 ga.  Needle type:spinal  Needle Placement: Oblique pedical  Findings:   -Comments: There was excellent flow of contrast along the articular pillars without intravascular flow.  Procedure Details: The fluoroscope beam is vertically oriented in AP and then obliqued 15 to 20 degrees to the ipsilateral side of the desired nerve to achieve the "Scotty dog" appearance.  The skin over the target area of the junction of the superior articulating process and the transverse process (sacral ala if blocking the L5 dorsal rami) was locally anesthetized with a 1 ml volume of 1% Lidocaine without Epinephrine.  The spinal needle was inserted and advanced in a trajectory view down to the target.   After contact with periosteum and negative aspirate for blood and CSF, correct placement without intravascular or epidural spread was confirmed by injecting 0.5 ml. of Isovue-250.  A spot radiograph was obtained of this image.    Next, a 0.5 ml. volume of the injectate described above was injected. The needle was then redirected to the other facet joint nerves mentioned above if needed.  Prior to the procedure, the patient was given a Pain Diary which was completed for baseline measurements.  After the procedure, the patient rated their pain every 30 minutes and will continue rating at this frequency for a total of 5 hours.  The patient has been asked to complete the Diary and return to [place] by mail,  fax or hand delivered as soon as possible.   Additional Comments:  The patient tolerated the procedure well Dressing: 2 x 2 sterile gauze and Band-Aid    Post-procedure details: Patient was observed during the procedure. Post-procedure instructions were reviewed.  Patient left the clinic in stable condition.    Clinical History: MRI LUMBAR SPINE WITHOUT  CONTRAST  TECHNIQUE: Multiplanar, multisequence MR imaging of the lumbar spine was performed. No intravenous contrast was administered.  COMPARISON:  None.  FINDINGS: Segmentation:  Normal  Alignment:  Normal  Vertebrae:  Normal bone marrow.  Negative for fracture or mass.  Conus medullaris and cauda equina: Conus extends to the L1-2 level. Conus and cauda equina appear normal.  Paraspinal and other soft tissues: Negative for paraspinous mass or fluid collection  Disc levels:  L1-2: Negative  L2-3: Negative  L3-4: Mild disc and facet degeneration without significant stenosis  L4-5: Mild disc and mild facet degeneration. Mild subarticular stenosis bilaterally  L5-S1: Severe facet degeneration bilaterally with facet joint effusions and diffuse facet hypertrophy. Mild disc degeneration. Severe subarticular and foraminal stenosis with bilateral L5 and S1 nerve root impingement.  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  Mild degenerative change L3-4 and L4-5.   Electronically Signed   By: Franchot Gallo M.D.   On: 10/15/2017 10:28     Objective:  VS:  HT:    WT:   BMI:     BP:124/75  HR:62bpm  TEMP: ( )  RESP:  Physical Exam Constitutional:      General: She is not in acute distress.    Appearance: Normal appearance. She is not ill-appearing.  HENT:     Head: Normocephalic and atraumatic.     Right Ear: External ear normal.     Left Ear: External ear normal.  Eyes:     Extraocular Movements: Extraocular movements intact.  Cardiovascular:     Rate and Rhythm: Normal rate.     Pulses: Normal pulses.  Musculoskeletal:     Right lower leg: No edema.     Left lower leg: No edema.     Comments: Patient has good distal strength with no pain over the greater trochanters.  No clonus or focal weakness. Patient somewhat slow to rise from a seated position to full extension.   There is concordant low back pain with facet loading and lumbar spine extension rotation.  There are no definitive trigger points but the patient is somewhat tender across the lower back and PSIS.  There is no pain with hip rotation.  Skin:    Findings: No erythema, lesion or rash.  Neurological:     General: No focal deficit present.     Mental Status: She is alert and oriented to person, place, and time.     Sensory: No sensory deficit.     Motor: No weakness or abnormal muscle tone.     Coordination: Coordination normal.  Psychiatric:        Mood and Affect: Mood normal.        Behavior: Behavior normal.      Imaging: No results found.

## 2019-10-29 ENCOUNTER — Telehealth: Payer: Self-pay | Admitting: Cardiology

## 2019-10-29 NOTE — Telephone Encounter (Signed)
LVM for patient to return call to be scheduled with [facility] from recall list 

## 2019-10-31 ENCOUNTER — Telehealth: Payer: Self-pay | Admitting: *Deleted

## 2019-10-31 NOTE — Telephone Encounter (Signed)
A message was left, re: her follow up visit. 

## 2019-11-13 DIAGNOSIS — H401132 Primary open-angle glaucoma, bilateral, moderate stage: Secondary | ICD-10-CM | POA: Diagnosis not present

## 2019-11-14 ENCOUNTER — Telehealth: Payer: Self-pay

## 2019-11-14 NOTE — Telephone Encounter (Signed)
ivonne from HR  At [employer] ( patient employer ) need to know if there are accomidations and if there is how long if she needs to be accomdated and what they are

## 2019-11-14 NOTE — Telephone Encounter (Signed)
Please see below. Please advise.

## 2019-11-14 NOTE — Telephone Encounter (Signed)
Please advise 

## 2019-11-14 NOTE — Telephone Encounter (Signed)
They probably should get Dr. Marlou Sa in terms of restrictions or accommodations, from my perspective if the last MBB helped and we do RFA and Pain is better should have very little reason for restriction at that point.

## 2019-11-15 NOTE — Telephone Encounter (Signed)
No restrictions thanks

## 2019-11-15 NOTE — Telephone Encounter (Signed)
I tried to reach [facility] with no answer.

## 2019-12-02 ENCOUNTER — Telehealth: Payer: Self-pay | Admitting: Cardiology

## 2019-12-02 NOTE — Telephone Encounter (Signed)
Spoke with patients son and he stated patient is on as needed basis for follow up  with Amy [name]

## 2019-12-11 ENCOUNTER — Ambulatory Visit (INDEPENDENT_AMBULATORY_CARE_PROVIDER_SITE_OTHER): Payer: BC Managed Care – PPO | Admitting: Orthopedic Surgery

## 2019-12-11 ENCOUNTER — Ambulatory Visit: Payer: Self-pay

## 2019-12-11 DIAGNOSIS — M5410 Radiculopathy, site unspecified: Secondary | ICD-10-CM

## 2019-12-11 DIAGNOSIS — M25461 Effusion, right knee: Secondary | ICD-10-CM | POA: Diagnosis not present

## 2019-12-11 DIAGNOSIS — M25561 Pain in right knee: Secondary | ICD-10-CM

## 2019-12-15 ENCOUNTER — Encounter: Payer: Self-pay | Admitting: Orthopedic Surgery

## 2019-12-15 DIAGNOSIS — M25461 Effusion, right knee: Secondary | ICD-10-CM

## 2019-12-15 NOTE — Progress Notes (Signed)
Office Visit Note   [name]           Date of Birth: 1960-12-24           MRN: [id number] Visit Date: 12/11/2019 Requested by: Girtha Rm, NP-C [place],  [place] [address] PCP: Girtha Rm, NP-C  Subjective: Chief Complaint  Patient presents with  . Lower Back - Follow-up  . Right Knee - Pain    HPI: Amy [name] is a 59 y.o. female who presents to the office complaining of low back pain with radicular leg pain.  Patient returns after bilateral L4-L5 lumbar facet/medial branch block epidural steroid injections on 09/26/2019 by Dr. Ernestina Patches.  She notes 20% relief from injection.  She notes that her pain has worsened in the last several months and now she complains of significant radicular pain down the posterior leg into her foot.  Radiates to the plantar aspect of her foot.  She denies any weakness of the leg.  She does note occasional numbness and tingling in the same distribution.  She had an MRI scan 2019 of the lumbar spine that revealed severe facet degeneration at L5-S1 with marked bilateral foraminal stenosis at L5-S1.  Patient also complains of 3 weeks of right knee pain.  She denies any injury leading to onset of pain.  She notes that her right knee feels that it gives way at times.  She notes painful weightbearing and increased swelling compared with contralateral side.  She notes grinding and crepitus in the right knee without frank locking.  She has no history of right knee surgery.              ROS: All systems reviewed are negative as they relate to the chief complaint within the history of present illness.  Patient denies fevers or chills.  Assessment & Plan: Visit Diagnoses:  1. Right knee pain, unspecified chronicity   2. Effusion, right knee   3. Radicular syndrome of right leg     Plan: Patient is a 59 year old female presents complaining of right knee pain and low back pain with right-sided lower extremity radiculopathy.  She  has had a history of what seem to be facet mediated low back pain in the past.  She has had previous injection back in June that provided 20% relief.  Currently, it seems her complaint is more radicular than it has been in the past.  She does have a history of marked bilateral foraminal stenosis at L5-S1.  Plan to refer patient back to Dr. Ernestina Patches for another try with epidural steroid injections.  She agrees with plan.  Additionally, patient complains of right knee pain for the last 3 weeks.  She does have medial joint line tenderness.  No significant findings on radiographs and no advanced arthritis.  Discussed options available to patient.  Plan to administer right knee cortisone injection today.  Patient tolerated the procedure well.  She will follow-up as needed.  If no improvement, consider MRI scan.  Patient agreed with plan.  Follow-Up Instructions: No follow-ups on file.   Orders:  Orders Placed This Encounter  Procedures  . XR KNEE 3 VIEW RIGHT  . Ambulatory referral to Physical Medicine Rehab   No orders of the defined types were placed in this encounter.     Procedures: Large Joint Inj: R knee on 12/15/2019 12:30 PM Indications: diagnostic evaluation, joint swelling and pain Details: 18 G 1.5 in needle, superolateral approach  Arthrogram: No  Medications: 5  mL lidocaine 1 %; 40 mg methylPREDNISolone acetate 40 MG/ML; 4 mL bupivacaine 0.25 % Outcome: tolerated well, no immediate complications Procedure, treatment alternatives, risks and benefits explained, specific risks discussed. Consent was given by the patient. Immediately prior to procedure a time out was called to verify the correct patient, procedure, equipment, support staff and site/side marked as required. Patient was prepped and draped in the usual sterile fashion.       Clinical Data: No additional findings.  Objective: Vital Signs: There were no vitals taken for this visit.  Physical Exam:  Constitutional:  Patient appears well-developed HEENT:  Head: Normocephalic Eyes:EOM are normal Neck: Normal range of motion Cardiovascular: Normal rate Pulmonary/chest: Effort normal Neurologic: Patient is alert Skin: Skin is warm Psychiatric: Patient has normal mood and affect  Ortho Exam: Ortho exam demonstrates right knee with trace effusion.  0 degrees extension to greater than 90 degrees of flexion.  Tenderness over the medial joint line and minor amount of tenderness over the pes anserine bursa.  No tenderness elsewhere throughout the right knee.  No significant groin pain elicited with hip range of motion.  Negative Stinchfield exam.  No clonus noted.  No loss of sensation throughout the bilateral lower extremity dermatomes.  5/5 motor strength of the bilateral hip flexors, quadriceps, hamstring, dorsiflexion, plantarflexion.  Positive straight leg raise of the right lower extremity  Specialty Comments:  No specialty comments available.  Imaging: No results found.   PMFS History: Patient Active Problem List   Diagnosis Date Noted  . Leiomyoma of uterus 04/29/2019  . Thickened endometrium   . Family history of heart disease 01/11/2019  . History of chest pain 01/11/2019  . Vitamin D deficiency 01/15/2018  . Prediabetes 01/03/2018  . Gastroesophageal reflux disease 01/03/2018  . Essential hypertension 01/03/2018  . Chronic bilateral low back pain without sciatica 09/20/2017  . Endometrial polyp 2016  . ABDOMINAL BLOATING 03/23/2010   Past Medical History:  Diagnosis Date  . Endometrial polyp 2016   [facility]  . GERD (gastroesophageal reflux disease)   . Glaucoma   . Hypertension   . Leiomyoma of uterus 04/29/2019  . Prediabetes   . Thickened endometrium 2016   per [facility] records  . Vitamin D deficiency 01/15/2018    Family History  Problem Relation Age of Onset  . Hypertension Mother   . Heart failure Mother   . Hypertension Father   . Hypertension Sister     . Hypertension Brother     Past Surgical History:  Procedure Laterality Date  . CESAREAN SECTION    . DILATION AND CURETTAGE OF UTERUS  2016   Social History   Occupational History  . Not on file  Tobacco Use  . Smoking status: Never Smoker  . Smokeless tobacco: Never Used  Substance and Sexual Activity  . Alcohol use: No  . Drug use: No  . Sexual activity: Not on file

## 2019-12-18 ENCOUNTER — Encounter: Payer: Self-pay | Admitting: Orthopedic Surgery

## 2019-12-18 MED ORDER — BUPIVACAINE HCL 0.25 % IJ SOLN
4.0000 mL | INTRAMUSCULAR | Status: AC | PRN
  Administered 2019-12-15: 4 mL via INTRA_ARTICULAR

## 2019-12-18 MED ORDER — METHYLPREDNISOLONE ACETATE 40 MG/ML IJ SUSP
40.0000 mg | INTRAMUSCULAR | Status: AC | PRN
  Administered 2019-12-15: 40 mg via INTRA_ARTICULAR

## 2019-12-18 MED ORDER — LIDOCAINE HCL 1 % IJ SOLN
5.0000 mL | INTRAMUSCULAR | Status: AC | PRN
  Administered 2019-12-15: 5 mL

## 2020-01-13 ENCOUNTER — Ambulatory Visit: Payer: BC Managed Care – PPO | Admitting: Physical Medicine and Rehabilitation

## 2020-03-21 ENCOUNTER — Other Ambulatory Visit: Payer: Self-pay | Admitting: Family Medicine

## 2020-03-23 NOTE — Telephone Encounter (Signed)
Pt made appt for 1/7

## 2020-04-10 ENCOUNTER — Encounter: Payer: BC Managed Care – PPO | Admitting: Family Medicine

## 2020-04-30 ENCOUNTER — Telehealth: Payer: Self-pay | Admitting: Orthopedic Surgery

## 2020-04-30 NOTE — Telephone Encounter (Signed)
Bilateral L4 TF on 09/26/19. Ok to repeat if helped, same problem/side, and no new injury?

## 2020-04-30 NOTE — Telephone Encounter (Signed)
Pt came up here and was wondering if she can schedule an appt with Dr.Newton on April 4th around  3:00 3:30 because she will have a ride this day. CB [phone]

## 2020-04-30 NOTE — Telephone Encounter (Signed)
ok 

## 2020-05-04 NOTE — Telephone Encounter (Signed)
Is auth needed for Bilateral L4 TF? Scheduled for 4/4 at 1500 with driver.

## 2020-05-04 NOTE — Telephone Encounter (Signed)
Pt not req Auth#. 

## 2020-05-07 DIAGNOSIS — S20219A Contusion of unspecified front wall of thorax, initial encounter: Secondary | ICD-10-CM | POA: Diagnosis not present

## 2020-05-07 DIAGNOSIS — S46911A Strain of unspecified muscle, fascia and tendon at shoulder and upper arm level, right arm, initial encounter: Secondary | ICD-10-CM | POA: Diagnosis not present

## 2020-05-07 DIAGNOSIS — M542 Cervicalgia: Secondary | ICD-10-CM | POA: Diagnosis not present

## 2020-05-07 DIAGNOSIS — M549 Dorsalgia, unspecified: Secondary | ICD-10-CM | POA: Diagnosis not present

## 2020-05-12 DIAGNOSIS — M488X2 Other specified spondylopathies, cervical region: Secondary | ICD-10-CM | POA: Diagnosis not present

## 2020-05-12 DIAGNOSIS — M5134 Other intervertebral disc degeneration, thoracic region: Secondary | ICD-10-CM | POA: Diagnosis not present

## 2020-06-24 DIAGNOSIS — H43813 Vitreous degeneration, bilateral: Secondary | ICD-10-CM | POA: Diagnosis not present

## 2020-06-24 DIAGNOSIS — H401132 Primary open-angle glaucoma, bilateral, moderate stage: Secondary | ICD-10-CM | POA: Diagnosis not present

## 2020-06-26 ENCOUNTER — Other Ambulatory Visit: Payer: Self-pay

## 2020-06-26 ENCOUNTER — Encounter: Payer: Self-pay | Admitting: Family Medicine

## 2020-06-26 ENCOUNTER — Ambulatory Visit (INDEPENDENT_AMBULATORY_CARE_PROVIDER_SITE_OTHER): Payer: BC Managed Care – PPO | Admitting: Family Medicine

## 2020-06-26 VITALS — BP 122/82 | HR 57 | Temp 97.8°F | Wt 196.0 lb

## 2020-06-26 DIAGNOSIS — E669 Obesity, unspecified: Secondary | ICD-10-CM

## 2020-06-26 DIAGNOSIS — R7303 Prediabetes: Secondary | ICD-10-CM | POA: Diagnosis not present

## 2020-06-26 DIAGNOSIS — K219 Gastro-esophageal reflux disease without esophagitis: Secondary | ICD-10-CM

## 2020-06-26 DIAGNOSIS — E559 Vitamin D deficiency, unspecified: Secondary | ICD-10-CM

## 2020-06-26 DIAGNOSIS — I10 Essential (primary) hypertension: Secondary | ICD-10-CM

## 2020-06-26 MED ORDER — FAMOTIDINE 20 MG PO TABS: 20.0000 mg | ORAL_TABLET | Freq: Every day | ORAL | 2 refills | Status: DC | PRN

## 2020-06-26 MED ORDER — ESOMEPRAZOLE MAGNESIUM 40 MG PO CPDR: DELAYED_RELEASE_CAPSULE | ORAL | 1 refills | Status: DC

## 2020-06-26 MED ORDER — HYDROCHLOROTHIAZIDE 12.5 MG PO TABS: ORAL_TABLET | ORAL | 1 refills | Status: DC

## 2020-06-26 MED ORDER — AMLODIPINE BESYLATE 10 MG PO TABS: 10.0000 mg | ORAL_TABLET | Freq: Every day | ORAL | 1 refills | Status: DC

## 2020-06-26 NOTE — Progress Notes (Signed)
   Subjective:    Patient ID: Amy [name], female    DOB: [birth year], 60 y.o.   MRN: [id number]  HPI Chief Complaint  Patient presents with  . med check    She was under the impression that she was being seen today for a CPE.  She was actually scheduled for a medication management visit. Reports she will be taking a trip to [place] for 6 weeks beginning in May.  Hypertension-reports taking amlodipine and HCTZ without any concerns.  GERD-requests refill on Nexium and Pepcid.  States she takes Nexium in the morning and Pepcid in the evening.  She has not seen GI recently.  Prediabetes-she is not on medication for prediabetes.  She wants to make sure that her blood sugars are not much higher.  She does not check blood sugar at home.  Vitamin D deficiency-she is currently taking a supplement.  Request to have her vitamin D level checked.  Denies fever, chills, dizziness, chest pain, palpitation, shortness of breath, abdominal pain, nausea, vomiting, diarrhea, urinary symptoms or edema.   Glaucoma treated by her eye doctor.   Review of Systems     Objective:   Physical Exam BP 122/82   Pulse (!) 57   Temp 97.8 F (36.6 C)   Wt 196 lb (88.9 kg)   SpO2 98%   BMI 35.85 kg/m   Alert and in no distress.  Cardiac exam shows a regular sinus rhythm without murmurs or gallops. Lungs are clear to auscultation.      Assessment & Plan:  Essential hypertension - Plan: CBC with Differential/Platelet, Comprehensive metabolic panel -Discussed that her blood pressure is controlled.  Continue on current medications.  Gastroesophageal reflux disease, unspecified whether esophagitis present - Plan: esomeprazole (NEXIUM) 40 MG capsule, famotidine (PEPCID) 20 MG tablet -I will refill her medications but advised her to try to cut back on use of these medications.  Avoid triggers.  If she is continuing to need the medication when she returns from her trip to [place], I recommend that she see  GI.  Prediabetes - Plan: Hemoglobin A1c, TSH, T4, free -Counseling on healthy diet including low sugar, low carbohydrate and increasing physical activity to help prevent worsening prediabetes and diabetes.  Vitamin D deficiency - Plan: VITAMIN D 25 Hydroxy (Vit-D Deficiency, Fractures) -Check vitamin D level and follow-up  Obesity (BMI 30-39.9) - Plan: Lipid panel -Recommend healthy diet and exercise to help with weight loss

## 2020-06-27 LAB — CBC WITH DIFFERENTIAL/PLATELET
Basophils Absolute: 0 10*3/uL (ref 0.0–0.2)
Basos: 1 %
EOS (ABSOLUTE): 0.2 10*3/uL (ref 0.0–0.4)
Eos: 4 %
Hematocrit: 43.7 % (ref 34.0–46.6)
Hemoglobin: 14.1 g/dL (ref 11.1–15.9)
Immature Grans (Abs): 0 10*3/uL (ref 0.0–0.1)
Immature Granulocytes: 0 %
Lymphocytes Absolute: 2.6 10*3/uL (ref 0.7–3.1)
Lymphs: 49 %
MCH: 30.5 pg (ref 26.6–33.0)
MCHC: 32.3 g/dL (ref 31.5–35.7)
MCV: 94 fL (ref 79–97)
Monocytes Absolute: 0.4 10*3/uL (ref 0.1–0.9)
Monocytes: 9 %
Neutrophils Absolute: 1.9 10*3/uL (ref 1.4–7.0)
Neutrophils: 37 %
Platelets: 283 10*3/uL (ref 150–450)
RBC: 4.63 x10E6/uL (ref 3.77–5.28)
RDW: 11.8 % (ref 11.7–15.4)
WBC: 5.1 10*3/uL (ref 3.4–10.8)

## 2020-06-27 LAB — HEMOGLOBIN A1C
Est. average glucose Bld gHb Est-mCnc: 140 mg/dL
Hgb A1c MFr Bld: 6.5 % — ABNORMAL HIGH (ref 4.8–5.6)

## 2020-06-27 LAB — COMPREHENSIVE METABOLIC PANEL
ALT: 20 IU/L (ref 0–32)
AST: 16 IU/L (ref 0–40)
Albumin/Globulin Ratio: 1.5 (ref 1.2–2.2)
Albumin: 4.3 g/dL (ref 3.8–4.9)
Alkaline Phosphatase: 77 IU/L (ref 44–121)
BUN/Creatinine Ratio: 17 (ref 9–23)
BUN: 13 mg/dL (ref 6–24)
Bilirubin Total: 0.5 mg/dL (ref 0.0–1.2)
CO2: 24 mmol/L (ref 20–29)
Calcium: 10 mg/dL (ref 8.7–10.2)
Chloride: 103 mmol/L (ref 96–106)
Creatinine, Ser: 0.78 mg/dL (ref 0.57–1.00)
Globulin, Total: 2.8 g/dL (ref 1.5–4.5)
Glucose: 90 mg/dL (ref 65–99)
Potassium: 4.2 mmol/L (ref 3.5–5.2)
Sodium: 141 mmol/L (ref 134–144)
Total Protein: 7.1 g/dL (ref 6.0–8.5)
eGFR: 88 mL/min/{1.73_m2} (ref >59)

## 2020-06-27 LAB — LIPID PANEL
Chol/HDL Ratio: 3.4 ratio (ref 0.0–4.4)
Cholesterol, Total: 169 mg/dL (ref 100–199)
HDL: 50 mg/dL (ref >39)
LDL Chol Calc (NIH): 105 mg/dL — ABNORMAL HIGH (ref 0–99)
Triglycerides: 75 mg/dL (ref 0–149)
VLDL Cholesterol Cal: 14 mg/dL (ref 5–40)

## 2020-06-27 LAB — VITAMIN D 25 HYDROXY (VIT D DEFICIENCY, FRACTURES): Vit D, 25-Hydroxy: 30.5 ng/mL (ref 30.0–100.0)

## 2020-06-27 LAB — TSH: TSH: 1.15 u[IU]/mL (ref 0.450–4.500)

## 2020-06-27 LAB — T4, FREE: Free T4: 0.98 ng/dL (ref 0.82–1.77)

## 2020-06-28 NOTE — Progress Notes (Signed)
Her blood sugars are now at the line of diabetes with a Hgb A1c of 6.5% which is the beginning of diabetes. We can discuss starting her on medication or she can pay closer attention to her diet and cut back on carbohydrates and sugar for the next 3-4 months. Her labs are otherwise ok.

## 2020-06-29 NOTE — Progress Notes (Signed)
Please let her son know that I am willing to start her on the medication but would like to discuss how to take this and potential side effects unless he is able and comfortable doing that then I can send in the medication. Her LDL or bad cholesterol is elevated at 105 and we could discuss starting her on a statin as well but she did express at her visit that she does not like to take a lot of medication. She has a follow up with me before she travels for a CPE and we can discuss all of this again.

## 2020-07-02 ENCOUNTER — Telehealth (INDEPENDENT_AMBULATORY_CARE_PROVIDER_SITE_OTHER): Payer: BC Managed Care – PPO | Admitting: Family Medicine

## 2020-07-02 ENCOUNTER — Other Ambulatory Visit: Payer: Self-pay

## 2020-07-02 ENCOUNTER — Encounter: Payer: Self-pay | Admitting: Family Medicine

## 2020-07-02 DIAGNOSIS — E7800 Pure hypercholesterolemia, unspecified: Secondary | ICD-10-CM

## 2020-07-02 DIAGNOSIS — R7309 Other abnormal glucose: Secondary | ICD-10-CM

## 2020-07-02 MED ORDER — ATORVASTATIN CALCIUM 10 MG PO TABS
10.0000 mg | ORAL_TABLET | Freq: Every day | ORAL | 1 refills | Status: DC
Start: 2020-07-02 — End: 2021-03-01

## 2020-07-02 MED ORDER — METFORMIN HCL 500 MG PO TABS: 500.0000 mg | ORAL_TABLET | Freq: Every day | ORAL | 1 refills | Status: DC

## 2020-07-02 NOTE — Progress Notes (Signed)
   Subjective:  Documentation for virtual telephone encounter.  The patient was located at work. The provider was located in the office. The patient did consent to this visit and is aware of possible charges through their insurance for this visit.  The other persons participating in this telemedicine service were none. Time spent on call was 12 minutes and in review of previous records 15 minutes total.  This virtual service is not related to other E/M service within previous 7 days.  99441 (5-14min) 99442 (11-71min) 99443 (21-40min)   Patient ID: Amy [name], female    DOB: [birth year], 60 y.o.   MRN: [id number]  HPI Chief Complaint  Patient presents with  . discuss medication    This is a virtual telephone call to discuss recent abnormal labs including a hemoglobin A1c of 6.5% and LDL of 105. She is amenable to starting on medication.   Review of Systems Pertinent positives and negatives in the history of present illness.     Objective:   Physical Exam There were no vitals taken for this visit.  And in no acute distress.  Speaking in complete sentences without difficulty.      Assessment & Plan:  Elevated hemoglobin A1c - Plan: metFORMIN (GLUCOPHAGE) 500 MG tablet  Elevated LDL cholesterol level - Plan: atorvastatin (LIPITOR) 10 MG tablet  Counseling on the spectrum of diabetes.  She is currently still borderline with a hemoglobin A1c of 6.5% but she is aware that this is the beginning of diabetes.  I will start her on Metformin 500 mg with breakfast.  Counseling on potential side effects.  I will also start her on atorvastatin to lower her LDL and I counseled her on potential side effects of this medication. Recommend low sugar, low carbohydrate and low-fat diet. She will follow-up with me for her CPE in approximately 1 month.  I asked her to come in fasting for this visit.

## 2020-07-06 ENCOUNTER — Other Ambulatory Visit: Payer: Self-pay

## 2020-07-06 ENCOUNTER — Ambulatory Visit (INDEPENDENT_AMBULATORY_CARE_PROVIDER_SITE_OTHER): Payer: BC Managed Care – PPO | Admitting: Physical Medicine and Rehabilitation

## 2020-07-06 ENCOUNTER — Ambulatory Visit: Payer: Self-pay

## 2020-07-06 ENCOUNTER — Encounter: Payer: Self-pay | Admitting: Physical Medicine and Rehabilitation

## 2020-07-06 VITALS — BP 124/82 | HR 82

## 2020-07-06 DIAGNOSIS — M48061 Spinal stenosis, lumbar region without neurogenic claudication: Secondary | ICD-10-CM | POA: Diagnosis not present

## 2020-07-06 MED ORDER — METHYLPREDNISOLONE ACETATE 80 MG/ML IJ SUSP
80.0000 mg | Freq: Once | INTRAMUSCULAR | Status: AC
  Administered 2020-07-06: 80 mg

## 2020-07-06 NOTE — Procedures (Signed)
S1 Lumbosacral Transforaminal Epidural Steroid Injection - Sub-Pedicular Approach with Fluoroscopic Guidance   [name]      Date of Birth: December 11, 1960 MRN: [id number] PCP: Girtha Rm, NP-C      Visit Date: 07/06/2020   Universal Protocol:    Date/Time: 04/04/227:12 PM  Consent Given By: the patient  Position:  PRONE  Additional Comments: Vital signs were monitored before and after the procedure. Patient was prepped and draped in the usual sterile fashion. The correct patient, procedure, and site was verified.   Injection Procedure Details:  Procedure Site One Meds Administered:  Meds ordered this encounter  Medications  . methylPREDNISolone acetate (DEPO-MEDROL) injection 80 mg    Laterality: Bilateral  Location/Site:  S1 Foramen   Needle size: 22 ga.  Needle type: Spinal  Needle Placement: Transforaminal  Findings:   -Comments: Excellent flow of contrast along the nerve, nerve root and into the epidural space.  Epidurogram: Contrast epidurogram showed no nerve root cut off or restricted flow pattern.  Procedure Details: After squaring off the sacral end-plate to get a true AP view, the C-arm was positioned so that the best possible view of the S1 foramen was visualized. The soft tissues overlying this structure were infiltrated with 2-3 ml. of 1% Lidocaine without Epinephrine.    The spinal needle was inserted toward the target using a "trajectory" view along the fluoroscope beam.  Under AP and lateral visualization, the needle was advanced so it did not puncture dura. Biplanar projections were used to confirm position. Aspiration was confirmed to be negative for CSF and/or blood. A 1-2 ml. volume of Isovue-250 was injected and flow of contrast was noted at each level. Radiographs were obtained for documentation purposes.   After attaining the desired flow of contrast documented above, a 0.5 to 1.0 ml test dose of 0.25% Marcaine was injected into  each respective transforaminal space.  The patient was observed for 90 seconds post injection.  After no sensory deficits were reported, and normal lower extremity motor function was noted,   the above injectate was administered so that equal amounts of the injectate were placed at each foramen (level) into the transforaminal epidural space.   Additional Comments:  The patient tolerated the procedure well Dressing: Band-Aid with 2 x 2 sterile gauze    Post-procedure details: Patient was observed during the procedure. Post-procedure instructions were reviewed.  Patient left the clinic in stable condition.

## 2020-07-06 NOTE — Patient Instructions (Signed)

## 2020-07-06 NOTE — Progress Notes (Signed)
Pt state lower back pain that travels to her right side and down to her left calf. Pt state walking, standing and sitting makes the pain worse. Pt state she take pain meds to help ease the pain.   Numeric Pain Rating Scale and Functional Assessment Average Pain 9   In the last MONTH (on 0-10 scale) has pain interfered with the following?  1. General activity like being  able to carry out your everyday physical activities such as walking, climbing stairs, carrying groceries, or moving a chair?  Rating(10)   +Driver, -BT, -Dye Allergies.

## 2020-07-06 NOTE — Progress Notes (Signed)
Amy [name] - 60 y.o. female MRN [id number]  Date of birth: 1961-03-13  Office Visit Note: Visit Date: 07/06/2020 PCP: Girtha Rm, NP-C Referred by: Girtha Rm, NP-C  Subjective: Chief Complaint  Patient presents with  . Lower Back - Pain   HPI:  Amy [name] is a 60 y.o. female who comes in today At the request of Dr. Anderson Malta for planned bilateral S1 transforaminal epidural steroid injection.  Patient does have clinical symptoms really consistent with S1 radicular pattern down both legs in the posterior lateral calf into the bottom lateral feet.  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  Symptoms do not seem to correlate with L5.  Obviously cannot completely rule that out.  The rest of her spine above this level looks fairly good.  Prior diagnostic medial branch blocks of the L5-S1 facet joints were diagnostic on the first injection with more than 50% relief but not diagnostic on the second injection.  I do think she probably has pain in the back from the facet joints is looking how bad they are.  She does speak [place] but does have a family member helping today as well.  She denies any focal weakness.  She has had follow-up with Dr. Marlou Sa.  MRI reviewed with her today.  ROS Otherwise per HPI.  Assessment & Plan: Visit Diagnoses:    ICD-10-CM   1. Foraminal stenosis of lumbar region  M48.061 XR C-ARM NO REPORT    Epidural Steroid injection    methylPREDNISolone acetate (DEPO-MEDROL) injection 80 mg    Plan: No additional findings.   Meds & Orders:  Meds ordered this encounter  Medications  . methylPREDNISolone acetate (DEPO-MEDROL) injection 80 mg    Orders Placed This Encounter  Procedures  . XR C-ARM NO REPORT  . Epidural Steroid injection    Follow-up: Return if symptoms worsen or fail to improve.   Procedures: No procedures performed  S1 Lumbosacral Transforaminal Epidural Steroid Injection - Sub-Pedicular Approach  with Fluoroscopic Guidance   [name]      Date of Birth: [birth year] MRN: [id number] PCP: Girtha Rm, NP-C      Visit Date: 07/06/2020   Universal Protocol:    Date/Time: 04/04/227:12 PM  Consent Given By: the patient  Position:  PRONE  Additional Comments: Vital signs were monitored before and after the procedure. Patient was prepped and draped in the usual sterile fashion. The correct patient, procedure, and site was verified.   Injection Procedure Details:  Procedure Site One Meds Administered:  Meds ordered this encounter  Medications  . methylPREDNISolone acetate (DEPO-MEDROL) injection 80 mg    Laterality: Bilateral  Location/Site:  S1 Foramen   Needle size: 22 ga.  Needle type: Spinal  Needle Placement: Transforaminal  Findings:   -Comments: Excellent flow of contrast along the nerve, nerve root and into the epidural space.  Epidurogram: Contrast epidurogram showed no nerve root cut off or restricted flow pattern.  Procedure Details: After squaring off the sacral end-plate to get a true AP view, the C-arm was positioned so that the best possible view of the S1 foramen was visualized. The soft tissues overlying this structure were infiltrated with 2-3 ml. of 1% Lidocaine without Epinephrine.    The spinal needle was inserted toward the target using a "trajectory" view along the fluoroscope beam.  Under AP and lateral visualization, the needle was advanced so it did not puncture dura. Biplanar projections were used to  confirm position. Aspiration was confirmed to be negative for CSF and/or blood. A 1-2 ml. volume of Isovue-250 was injected and flow of contrast was noted at each level. Radiographs were obtained for documentation purposes.   After attaining the desired flow of contrast documented above, a 0.5 to 1.0 ml test dose of 0.25% Marcaine was injected into each respective transforaminal space.  The patient was observed for 90 seconds  post injection.  After no sensory deficits were reported, and normal lower extremity motor function was noted,   the above injectate was administered so that equal amounts of the injectate were placed at each foramen (level) into the transforaminal epidural space.   Additional Comments:  The patient tolerated the procedure well Dressing: Band-Aid with 2 x 2 sterile gauze    Post-procedure details: Patient was observed during the procedure. Post-procedure instructions were reviewed.  Patient left the clinic in stable condition.     Clinical History: MRI LUMBAR SPINE WITHOUT CONTRAST  TECHNIQUE: Multiplanar, multisequence MR imaging of the lumbar spine was performed. No intravenous contrast was administered.  COMPARISON:  None.  FINDINGS: Segmentation:  Normal  Alignment:  Normal  Vertebrae:  Normal bone marrow.  Negative for fracture or mass.  Conus medullaris and cauda equina: Conus extends to the L1-2 level. Conus and cauda equina appear normal.  Paraspinal and other soft tissues: Negative for paraspinous mass or fluid collection  Disc levels:  L1-2: Negative  L2-3: Negative  L3-4: Mild disc and facet degeneration without significant stenosis  L4-5: Mild disc and mild facet degeneration. Mild subarticular stenosis bilaterally  L5-S1: Severe facet degeneration bilaterally with facet joint effusions and diffuse facet hypertrophy. Mild disc degeneration. Severe subarticular and foraminal stenosis with bilateral L5 and S1 nerve root impingement.  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  Mild degenerative change L3-4 and L4-5.   Electronically Signed   By: Franchot Gallo M.D.   On: 10/15/2017 10:28     Objective:  VS:  HT:    WT:   BMI:     BP:124/82  HR:82bpm  TEMP: ( )  RESP:  Physical Exam Vitals and nursing note reviewed.  Constitutional:      General: She  is not in acute distress.    Appearance: Normal appearance. She is obese. She is not ill-appearing.  HENT:     Head: Normocephalic and atraumatic.     Right Ear: External ear normal.     Left Ear: External ear normal.  Eyes:     Extraocular Movements: Extraocular movements intact.  Cardiovascular:     Rate and Rhythm: Normal rate.     Pulses: Normal pulses.  Pulmonary:     Effort: Pulmonary effort is normal. No respiratory distress.  Abdominal:     General: There is no distension.     Palpations: Abdomen is soft.  Musculoskeletal:        General: Tenderness present.     Cervical back: Neck supple.     Right lower leg: No edema.     Left lower leg: No edema.     Comments: Patient has good distal strength with no pain over the greater trochanters.  No clonus or focal weakness.  Skin:    Findings: No erythema, lesion or rash.  Neurological:     General: No focal deficit present.     Mental Status: She is alert and oriented to person, place, and time.     Sensory: No sensory deficit.  Motor: No weakness or abnormal muscle tone.     Coordination: Coordination normal.  Psychiatric:        Mood and Affect: Mood normal.        Behavior: Behavior normal.      Imaging: XR C-ARM NO REPORT  Result Date: 07/06/2020 Please see Notes tab for imaging impression.

## 2020-07-30 NOTE — Progress Notes (Signed)
Subjective:    Patient ID: Amy [name], female    DOB: 05-06-1960, 60 y.o.   MRN: [id number]  HPI Chief Complaint  Patient presents with  . cpe    Fasting cpe with pap.    She is here for a complete physical exam and to follow-up on chronic health conditions.  She is from [place] originally.Previous care was with Dr. Melvern Sample  Other providers: OB/GYN - Dr. Adline Potter doctor- Dr. Estrella Myrtle Smiles  Orthopedist- Dr. Marlou Sa and Dr. Ernestina Patches Cardiologist-Dr. Gardiner Rhyme   She leaves next week for [place] and is requesting medication to help prevent malaria.  She has a sheet of paper with a specific medication that she has taken in the past when traveling to [place] and requests mefloquine.  States she had severe itching when she took chloroquine and does not want that medication. States she will be in [place] for 6 weeks.   She now has diabetes with a hemoglobin A1c of 6.5%.  She started on metformin 1 month ago and states she is doing well. Hyperlipidemia with LDL of 105.  Started on a statin 1 month ago and would like to have her fasting lipids checked today.  She has a history of chest pain which she did see cardiology about and it was recommended that she follow-up in the fall 2021.  She did not follow-up per records.  She denies chest pain, palpitations, shortness of breath or LE edema.   Lives withherfamily. States she has ason and 3 daughters.Single, worksin a Teacher, adult education for [employer] Denies smoking, drinking alcohol, drug use  Diet: high in carbohydrates  Excerise: limited by her back pain    Health maintenance:  Mammogram: 2 years ago in [place] Colonoscopy: 2019 in [place].  States she was told she would be due for 10-year recall. Last Gynecological Exam: in 2020. DEXA: normal in past per patient.  This was done at Dr. Rolly Salter office. Last Dental Exam: 04/2020 Last Eye Exam: last month   History of fibroids and thickened endometrium and has not followed  up with her OB/GYN.  Wears seatbelt always, smoke detectors in home and functioning, does not text while driving and feels safe in home environment.   Reviewed allergies, medications, past medical, surgical, family, and social history.     Review of Systems Review of Systems Constitutional: -fever, -chills, -sweats, -unexpected weight change,-fatigue ENT: -runny nose, -ear pain, -sore throat Cardiology:  -chest pain, -palpitations, -edema Respiratory: -cough, -shortness of breath, -wheezing Gastroenterology: -abdominal pain, -nausea, -vomiting, -diarrhea, -constipation  Hematology: -bleeding or bruising problems Musculoskeletal: -arthralgias, -myalgias, -joint swelling, -back pain Ophthalmology: -vision changes Urology: -dysuria, -difficulty urinating, -hematuria, -urinary frequency, -urgency Neurology: -headache, -weakness, -tingling, -numbness       Objective:   Physical Exam BP 120/78   Pulse (!) 59   Ht 5\' 3"  (1.6 m)   Wt 196 lb 3.2 oz (89 kg)   BMI 34.76 kg/m   General Appearance:    Alert, cooperative, no distress, appears stated age  Head:    Normocephalic, without obvious abnormality, atraumatic  Eyes:    PERRL, conjunctiva/corneas clear, EOM's intact  Ears:    Normal TM's and external ear canals  Nose:   Mask on   Throat:   Mask on   Neck:   Supple, no lymphadenopathy;  thyroid:  no   enlargement/tenderness/nodules; no JVD  Back:    Spine nontender, no curvature, ROM normal, no CVA     tenderness  Lungs:  Clear to auscultation bilaterally without wheezes, rales or     ronchi; respirations unlabored  Chest Wall:    No tenderness or deformity   Heart:    Regular rate and rhythm, S1 and S2 normal, no murmur, rub   or gallop  Breast Exam:    No tenderness, masses, or nipple discharge or inversion.      No axillary lymphadenopathy  Abdomen:     Soft, non-tender, nondistended, normoactive bowel sounds,    no masses, no hepatosplenomegaly  Genitalia:    Normal  external genitalia without lesions.  BUS and vagina normal; cervix without lesions, or cervical motion tenderness. No abnormal vaginal discharge.  Uterus and adnexa not enlarged, nontender, no masses.  Pap performed. Chaperone present.      Extremities:   No clubbing, cyanosis or edema  Pulses:   2+ and symmetric all extremities  Skin:   Skin color, texture, turgor normal, no rashes or lesions  Lymph nodes:   Cervical, supraclavicular, and axillary nodes normal  Neurologic:   CNII-XII intact, normal strength, sensation and gait; reflexes 2+ and symmetric throughout          Psych:   Normal mood, affect, hygiene and grooming.        Assessment & Plan:  Routine general medical examination at a health care facility -Preventive health care reviewed.  She will call and schedule her mammogram.  Discussed that she has a history of fibroid and thickened endometrium and I recommend that she follow back up with her OB/GYN, she verbalized understanding.  Colonoscopy appears to be up-to-date.  Pap smear updated today.  Counseling on healthy lifestyle including diet and exercise.  Recommend regular dental and eye exams.  Immunizations discussed.  Discussed safety and health promotion.  Controlled type 2 diabetes mellitus without complication, without long-term current use of insulin (HCC) -Hemoglobin A1c 6.5% 1 month ago.  She started on metformin and reports doing well.  Essential hypertension -Blood pressure controlled.  Continue current medication.  Recommend low-sodium diet. -Review of her chart shows that she did not follow-up with her cardiologist as recommended.  I recommend that she call and schedule a visit  Elevated LDL cholesterol level - Plan: Lipid panel -Started statin therapy 1 month ago.  Check lipid panel and follow-up  Need for malaria prophylaxis - Plan: mefloquine (LARIAM) 250 MG tablet -She specifically requested this medication and has done fine in the past on it.  States she is  aware of how to take the medication.  Obesity (BMI 30-39.9) - Plan: Lipid panel -Recommend healthy diet and cutting back on carbohydrates.  Also recommend getting at least 150 minutes of physical activity each week.  Encounter for screening mammogram for malignant neoplasm of breast - Plan: MM DIGITAL SCREENING BILATERAL -She will call and schedule her mammogram  Screening for cervical cancer - Plan: Cytology - PAP([place]) -Follow-up pending results.  Also reviewed her chart and discussed the fact that she has a history of thickened endometrium and fibroids and she will call and schedule with her OB/GYN when she returns from her trip to [place].  Medication management - Plan: Lipid panel, ALT -Adjust medications as appropriate

## 2020-07-30 NOTE — Patient Instructions (Addendum)
Please call and schedule with your OB/GYN Dr. Carlota Raspberry when you return for a follow up.   [facility]   It looks like you are overdue for a follow up with your cardiologist and I recommend that you call and schedule this visit.   We will be in touch with your lab results.   Have a great trip to [place].    Preventive Care 1-59 Years Old, Female Preventive care refers to lifestyle choices and visits with your health care provider that can promote health and wellness. This includes:  A yearly physical exam. This is also called an annual wellness visit.  Regular dental and eye exams.  Immunizations.  Screening for certain conditions.  Healthy lifestyle choices, such as: ? Eating a healthy diet. ? Getting regular exercise. ? Not using drugs or products that contain nicotine and tobacco. ? Limiting alcohol use. What can I expect for my preventive care visit? Physical exam Your health care provider will check your:  Height and weight. These may be used to calculate your BMI (body mass index). BMI is a measurement that tells if you are at a healthy weight.  Heart rate and blood pressure.  Body temperature.  Skin for abnormal spots. Counseling Your health care provider may ask you questions about your:  Past medical problems.  Family's medical history.  Alcohol, tobacco, and drug use.  Emotional well-being.  Home life and relationship well-being.  Sexual activity.  Diet, exercise, and sleep habits.  Work and work Statistician.  Access to firearms.  Method of birth control.  Menstrual cycle.  Pregnancy history. What immunizations do I need? Vaccines are usually given at various ages, according to a schedule. Your health care provider will recommend vaccines for you based on your age, medical history, and lifestyle or other factors, such as travel or where you work.   What tests do I need? Blood tests  Lipid and  cholesterol levels. These may be checked every 5 years, or more often if you are over 80 years old.  Hepatitis C test.  Hepatitis B test. Screening  Lung cancer screening. You may have this screening every year starting at age 51 if you have a 30-pack-year history of smoking and currently smoke or have quit within the past 15 years.  Colorectal cancer screening. ? All adults should have this screening starting at age 28 and continuing until age 35. ? Your health care provider may recommend screening at age 7 if you are at increased risk. ? You will have tests every 1-10 years, depending on your results and the type of screening test.  Diabetes screening. ? This is done by checking your blood sugar (glucose) after you have not eaten for a while (fasting). ? You may have this done every 1-3 years.  Mammogram. ? This may be done every 1-2 years. ? Talk with your health care provider about when you should start having regular mammograms. This may depend on whether you have a family history of breast cancer.  BRCA-related cancer screening. This may be done if you have a family history of breast, ovarian, tubal, or peritoneal cancers.  Pelvic exam and Pap test. ? This may be done every 3 years starting at age 95. ? Starting at age 52, this may be done every 5 years if you have a Pap test in combination with an HPV test. Other tests  STD (sexually transmitted disease) testing, if you are at risk.  Bone  density scan. This is done to screen for osteoporosis. You may have this scan if you are at high risk for osteoporosis. Talk with your health care provider about your test results, treatment options, and if necessary, the need for more tests. Follow these instructions at home: Eating and drinking  Eat a diet that includes fresh fruits and vegetables, whole grains, lean protein, and low-fat dairy products.  Take vitamin and mineral supplements as recommended by your health care  provider.  Do not drink alcohol if: ? Your health care provider tells you not to drink. ? You are pregnant, may be pregnant, or are planning to become pregnant.  If you drink alcohol: ? Limit how much you have to 0-1 drink a day. ? Be aware of how much alcohol is in your drink. In the [place] one drink equals one 12 oz bottle of beer (355 mL), one 5 oz glass of wine (148 mL), or one 1 oz glass of hard liquor (44 mL).   Lifestyle  Take daily care of your teeth and gums. Brush your teeth every morning and night with fluoride toothpaste. Floss one time each day.  Stay active. Exercise for at least 30 minutes 5 or more days each week.  Do not use any products that contain nicotine or tobacco, such as cigarettes, e-cigarettes, and chewing tobacco. If you need help quitting, ask your health care provider.  Do not use drugs.  If you are sexually active, practice safe sex. Use a condom or other form of protection to prevent STIs (sexually transmitted infections).  If you do not wish to become pregnant, use a form of birth control. If you plan to become pregnant, see your health care provider for a prepregnancy visit.  If told by your health care provider, take low-dose aspirin daily starting at age 98.  Find healthy ways to cope with stress, such as: ? Meditation, yoga, or listening to music. ? Journaling. ? Talking to a trusted person. ? Spending time with friends and family. Safety  Always wear your seat belt while driving or riding in a vehicle.  Do not drive: ? If you have been drinking alcohol. Do not ride with someone who has been drinking. ? When you are tired or distracted. ? While texting.  Wear a helmet and other protective equipment during sports activities.  If you have firearms in your house, make sure you follow all gun safety procedures. What's next?  Visit your health care provider once a year for an annual wellness visit.  Ask your health care provider how often  you should have your eyes and teeth checked.  Stay up to date on all vaccines. This information is not intended to replace advice given to you by your health care provider. Make sure you discuss any questions you have with your health care provider. Document Revised: 12/24/2019 Document Reviewed: 11/30/2017 Elsevier Patient Education  2021 [employer].

## 2020-07-31 ENCOUNTER — Other Ambulatory Visit: Payer: Self-pay

## 2020-07-31 ENCOUNTER — Other Ambulatory Visit (HOSPITAL_COMMUNITY)
Admission: RE | Admit: 2020-07-31 | Discharge: 2020-07-31 | Disposition: A | Discharge status: Home / Self Care | Payer: BC Managed Care – PPO | Source: Ambulatory Visit | Attending: Family Medicine | Admitting: Family Medicine

## 2020-07-31 ENCOUNTER — Ambulatory Visit (INDEPENDENT_AMBULATORY_CARE_PROVIDER_SITE_OTHER): Payer: BC Managed Care – PPO | Admitting: Family Medicine

## 2020-07-31 ENCOUNTER — Encounter: Payer: Self-pay | Admitting: Family Medicine

## 2020-07-31 VITALS — BP 120/78 | HR 59 | Ht 63.0 in | Wt 196.2 lb

## 2020-07-31 DIAGNOSIS — Z1231 Encounter for screening mammogram for malignant neoplasm of breast: Secondary | ICD-10-CM | POA: Diagnosis not present

## 2020-07-31 DIAGNOSIS — E669 Obesity, unspecified: Secondary | ICD-10-CM | POA: Diagnosis not present

## 2020-07-31 DIAGNOSIS — Z79899 Other long term (current) drug therapy: Secondary | ICD-10-CM | POA: Diagnosis not present

## 2020-07-31 DIAGNOSIS — Z298 Encounter for other specified prophylactic measures: Secondary | ICD-10-CM | POA: Diagnosis not present

## 2020-07-31 DIAGNOSIS — E119 Type 2 diabetes mellitus without complications: Secondary | ICD-10-CM | POA: Diagnosis not present

## 2020-07-31 DIAGNOSIS — I10 Essential (primary) hypertension: Secondary | ICD-10-CM | POA: Diagnosis not present

## 2020-07-31 DIAGNOSIS — E7800 Pure hypercholesterolemia, unspecified: Secondary | ICD-10-CM

## 2020-07-31 DIAGNOSIS — Z0000 Encounter for general adult medical examination without abnormal findings: Secondary | ICD-10-CM

## 2020-07-31 DIAGNOSIS — Z124 Encounter for screening for malignant neoplasm of cervix: Secondary | ICD-10-CM | POA: Insufficient documentation

## 2020-07-31 DIAGNOSIS — E1165 Type 2 diabetes mellitus with hyperglycemia: Secondary | ICD-10-CM | POA: Insufficient documentation

## 2020-07-31 MED ORDER — MEFLOQUINE HCL 250 MG PO TABS: 250.0000 mg | ORAL_TABLET | ORAL | 0 refills | Status: DC

## 2020-08-01 LAB — LIPID PANEL
Chol/HDL Ratio: 2.4 ratio (ref 0.0–4.4)
Cholesterol, Total: 109 mg/dL (ref 100–199)
HDL: 46 mg/dL (ref >39)
LDL Chol Calc (NIH): 52 mg/dL (ref 0–99)
Triglycerides: 41 mg/dL (ref 0–149)
VLDL Cholesterol Cal: 11 mg/dL (ref 5–40)

## 2020-08-01 LAB — ALT: ALT: 21 IU/L (ref 0–32)

## 2020-08-02 NOTE — Progress Notes (Signed)
Her cholesterol has responded quite well from being on the statin.

## 2020-08-03 DIAGNOSIS — R87612 Low grade squamous intraepithelial lesion on cytologic smear of cervix (LGSIL): Secondary | ICD-10-CM | POA: Diagnosis not present

## 2020-08-05 ENCOUNTER — Other Ambulatory Visit: Payer: Self-pay | Admitting: Internal Medicine

## 2020-08-05 DIAGNOSIS — R87618 Other abnormal cytological findings on specimens from cervix uteri: Secondary | ICD-10-CM

## 2020-08-05 LAB — CYTOLOGY - PAP
Comment: NEGATIVE
High risk HPV: POSITIVE — AB

## 2020-08-05 NOTE — Progress Notes (Signed)
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

## 2020-09-16 ENCOUNTER — Other Ambulatory Visit: Payer: Self-pay

## 2020-09-16 DIAGNOSIS — I10 Essential (primary) hypertension: Secondary | ICD-10-CM

## 2020-09-16 MED ORDER — AMLODIPINE BESYLATE 10 MG PO TABS: 10.0000 mg | ORAL_TABLET | Freq: Every day | ORAL | 0 refills | Status: DC

## 2020-09-22 ENCOUNTER — Ambulatory Visit: Payer: BC Managed Care – PPO | Admitting: Obstetrics and Gynecology

## 2020-10-01 ENCOUNTER — Other Ambulatory Visit: Payer: Self-pay

## 2020-10-01 ENCOUNTER — Other Ambulatory Visit (HOSPITAL_COMMUNITY)
Admission: RE | Admit: 2020-10-01 | Discharge: 2020-10-01 | Disposition: A | Discharge status: Home / Self Care | Payer: BC Managed Care – PPO | Source: Ambulatory Visit | Attending: Obstetrics and Gynecology | Admitting: Obstetrics and Gynecology

## 2020-10-01 ENCOUNTER — Encounter: Payer: Self-pay | Admitting: Obstetrics & Gynecology

## 2020-10-01 ENCOUNTER — Ambulatory Visit (INDEPENDENT_AMBULATORY_CARE_PROVIDER_SITE_OTHER): Payer: BC Managed Care – PPO | Admitting: Obstetrics & Gynecology

## 2020-10-01 ENCOUNTER — Other Ambulatory Visit (HOSPITAL_COMMUNITY)
Admission: RE | Admit: 2020-10-01 | Discharge: 2020-10-01 | Disposition: A | Discharge status: Home / Self Care | Payer: BC Managed Care – PPO | Source: Ambulatory Visit | Attending: Obstetrics & Gynecology | Admitting: Obstetrics & Gynecology

## 2020-10-01 VITALS — BP 130/78 | HR 67 | Resp 16 | Ht 61.75 in | Wt 191.0 lb

## 2020-10-01 DIAGNOSIS — B373 Candidiasis of vulva and vagina: Secondary | ICD-10-CM | POA: Diagnosis not present

## 2020-10-01 DIAGNOSIS — R87612 Low grade squamous intraepithelial lesion on cytologic smear of cervix (LGSIL): Secondary | ICD-10-CM | POA: Insufficient documentation

## 2020-10-01 DIAGNOSIS — N870 Mild cervical dysplasia: Secondary | ICD-10-CM | POA: Diagnosis not present

## 2020-10-01 DIAGNOSIS — R87810 Cervical high risk human papillomavirus (HPV) DNA test positive: Secondary | ICD-10-CM | POA: Diagnosis not present

## 2020-10-01 DIAGNOSIS — B3731 Acute candidiasis of vulva and vagina: Secondary | ICD-10-CM

## 2020-10-01 MED ORDER — FLUCONAZOLE 150 MG PO TABS: 150.0000 mg | ORAL_TABLET | Freq: Every day | ORAL | 2 refills | Status: AC

## 2020-10-01 NOTE — Progress Notes (Signed)
    Amy [name] [birth year] [id number]        60 y.o.  G4P0013   RP: LGSIL/HPV HR Positive for a Colposcopy  HPI: Pap LGSIL/HPV HR Positive on 07/31/2020.  Previous Pap test normal.   OB History  Gravida Para Term Preterm AB Living  4       1 3   SAB IAB Ectopic Multiple Live Births  1       3    # Outcome Date GA Lbr Len/2nd Weight Sex Delivery Anes PTL Lv  4 Gravida           3 Gravida           2 Gravida           1 SAB             Past medical history,surgical history, problem list, medications, allergies, family history and social history were all reviewed and documented in the EPIC chart.   Directed ROS with pertinent positives and negatives documented in the history of present illness/assessment and plan.  Exam:  Vitals:   10/01/20 0943  BP: 130/78  Pulse: 67  Resp: 16  Weight: 191 lb (86.6 kg)  Height: 5' 1.75" (1.568 m)   General appearance:  Normal  Colposcopy Procedure Note Amy [name] 10/01/2020  Indications:  LGSIL/HPV HR pos on 07/31/2020  Procedure Details  The risks and benefits of the procedure and Written informed consent obtained.  Speculum placed in vagina and excellent visualization of cervix achieved, cervix swabbed x 3 with acetic acid solution.  Findings:  Cervix colposcopy: Physical Exam Genitourinary:       Vaginal colposcopy: Normal  Vulvar colposcopy: Normal  Perirectal colposcopy: Normal  The cervix was sprayed with Hurricane before performing the cervical biopsies.  Specimens: HPV 16-18-45.  Gono-Chlam.  Cervical Bx at 12 and 2 O'Clock.    Complications:  None, good hemostasis with Silver Nitrate. . Plan: Management per results   Assessment/Plan:  60 y.o. G4P0013   1. 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  Significance of high risk HPV and abnormal Pap test reviewed with patient.  Colposcopy procedure explained.  Colposcopy findings reviewed with patient.  Postprocedure precautions  reviewed.  Management per results. - Cytology - PAP( [facility]) - C. trachomatis/N. gonorrhoeae RNA - Surgical pathology( [place]/ [facility])  2. Pap smear of cervix shows high risk HPV present - Cytology - PAP( [place]) - C. trachomatis/N. gonorrhoeae RNA - Surgical pathology( / [facility])  3. Yeast vaginitis Yeast vaginitis.  Decision to treat with fluconazole.  Usage reviewed and prescription sent to pharmacy.  Other orders - fluconazole (DIFLUCAN) 150 MG tablet; Take 1 tablet (150 mg total) by mouth daily for 3 days.   Princess Bruins MD, 10:28 AM 10/01/2020

## 2020-10-02 LAB — C. TRACHOMATIS/N. GONORRHOEAE RNA
C. trachomatis RNA, TMA: NOT DETECTED
N. gonorrhoeae RNA, TMA: NOT DETECTED

## 2020-10-06 LAB — SURGICAL PATHOLOGY

## 2020-10-07 LAB — CYTOLOGY - PAP: Diagnosis: NEGATIVE

## 2020-10-11 NOTE — Progress Notes (Signed)
Chief Complaint  Patient presents with   Back Pain    Having back pain and dizziness, numbness and tingling in her legs and feet. Body aches and pain around her belly button x 2-3 weeks. She is asking for CMET. [facility]ick[facility] and they have been doing steroid injections-she said these do not help and is asking for neuro referral for her back.     She noted that about 2-3 weeks ago her body started feeling tired, developed discomfort at her umbilicus.  Discomfort comes and goes.  Not related to her bowels.  Body aches are "all over". She may not drink enough water (32 oz/day).  Urine is very yellow. On statin since March. Patient is requesting chem panel to be done (daughter requested).  She has seen Dr. Marlou Sa at ortho for joint pains, and for her LBP.  He referred her to Dr. Ernestina Patches, whom she saw in April.  She underwent S1 transforminal epidural steroid injection under fluoro, but patient states the injection wasn't helpful.  She is asking for neurosurgeon for her back. Last MRI was 2017 per pt. 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 Romona Curls notes. Pain starts at her mid to low lumbar spine, radiates to the R buttock and down the R leg.  PMH reviewed-- Had colpo 6/30. Pap was repeated at that time and was normal.  GC and chlamydia was normal.  Also treated for yeast vaginitis with diflucan at that time. Pathology from colpo showed LGSIL, CIN-1 at the two biopsied locations (12 o'clock and 2 o'clock). She states she hasn't gotten these results from her GYN.  HTN:  she reports she hasn't been taking her HCTZ daily, only if needed, if her BP is high.  Is compliant with taking amlodipine daily.   Pt was diagnosed with DM in 06/2020 (A1c 6.5%) and started on metformin and lipitor.  Taking metformin 500mg  daily (not ER). F/U lipids were improved. Lab Results  Component Value Date   CHOL 109 07/31/2020   HDL 46 07/31/2020    LDLCALC 52 07/31/2020   TRIG 41 07/31/2020   CHOLHDL 2.4 07/31/2020   ROS:  no fever, chills, URI symptoms, chest pain. +LBP radiating into leg. No bowel/bladder problems, no weakness in LE. +body aches per HPI.  She reports some vaginal itching (treated for yeast by GYN at time of colpo, has recurred).   PHYSICAL EXAM:  BP (!) 142/100   Pulse 72   Temp 97.7 F (36.5 C) (Tympanic)   Ht 5\' 2"  (1.575 m)   Wt 190 lb 9.6 oz (86.5 kg)   LMP 04/04/2012   BMI 34.86 kg/m   Pleasant female, appears to be in mild discomfort. HEENT: conjunctiva and sclera are clear, EOMI, wearing mask Neck: No lymphadenopathy or mass Heart: regular rate and rhythm Lungs: clear bilaterally Spine: nontender to palpation. Tender at R SI, and diffusely across buttock muscles on R. Slightly tender at lower lumbar paraspinous muscles on R, no significant spasm Neuro: alert and oriented, normal strength, gait. Neg SLR, 2+ DTRs Abdomen: obese, soft, no organomegaly or mass.  Small umbilical hernia (extending superiorly), easily reducible  Urine dip SG 1.015, tr protein o/w negative   ASSESSMENT/PLAN:  Chronic midline low back pain with right-sided sciatica - refer to neurosurg per her request. Didn't get improvement from 1 injection fr Dr. Ernestina Patches, seeing ortho, wants neurosurg - Plan: POCT Urinalysis DIP (Proadvantage Device), Ambulatory referral to Neurosurgery  Abdominal  pain, unspecified abdominal location - Plan: POCT Urinalysis DIP (Proadvantage Device)  Essential hypertension - BP elevated today. Advised to take HCTZ every day, plus amlodipine. Counseled re: monitoring elsewhere, low Na diet, and f/u if remains elevated  Umbilical hernia without obstruction and without gangrene - Small, educated/reassured.  Wt loss encouraged  Medication monitoring encounter - Plan: Comprehensive metabolic panel  BMI 00.3-49.1,PHXTA - weight loss encouraged.  pt also complained of some R knee pain, advised wt loss  would help (and can f/u with her ortho if worse)  Body aches - suspect poss myalgias from statin. Discussed use of CoQ10, and poss hold statin for 1-2 weeks. To contact us if persists to try med change.  Dysplasia of cervix, low grade (CIN 1) - on recent colpo. Pt given results, advised to contact GYN to discuss and ensure that proper f/u is arranged. Also to contact regarding her recurrent vag itching   I spent 45 minutes dedicated to the care of this patient, including pre-visit review of records, face to face time, post-visit ordering of testing and documentation.

## 2020-10-12 ENCOUNTER — Other Ambulatory Visit: Payer: Self-pay

## 2020-10-12 ENCOUNTER — Encounter: Payer: Self-pay | Admitting: Family Medicine

## 2020-10-12 ENCOUNTER — Ambulatory Visit (INDEPENDENT_AMBULATORY_CARE_PROVIDER_SITE_OTHER): Payer: BC Managed Care – PPO | Admitting: Family Medicine

## 2020-10-12 VITALS — BP 142/100 | HR 72 | Temp 97.7°F | Ht 62.0 in | Wt 190.6 lb

## 2020-10-12 DIAGNOSIS — G8929 Other chronic pain: Secondary | ICD-10-CM | POA: Diagnosis not present

## 2020-10-12 DIAGNOSIS — Z6834 Body mass index (BMI) 34.0-34.9, adult: Secondary | ICD-10-CM

## 2020-10-12 DIAGNOSIS — N870 Mild cervical dysplasia: Secondary | ICD-10-CM

## 2020-10-12 DIAGNOSIS — I10 Essential (primary) hypertension: Secondary | ICD-10-CM | POA: Diagnosis not present

## 2020-10-12 DIAGNOSIS — M545 Low back pain, unspecified: Secondary | ICD-10-CM

## 2020-10-12 DIAGNOSIS — K429 Umbilical hernia without obstruction or gangrene: Secondary | ICD-10-CM | POA: Diagnosis not present

## 2020-10-12 DIAGNOSIS — M5441 Lumbago with sciatica, right side: Secondary | ICD-10-CM | POA: Diagnosis not present

## 2020-10-12 DIAGNOSIS — Z5181 Encounter for therapeutic drug level monitoring: Secondary | ICD-10-CM

## 2020-10-12 DIAGNOSIS — R109 Unspecified abdominal pain: Secondary | ICD-10-CM

## 2020-10-12 DIAGNOSIS — R52 Pain, unspecified: Secondary | ICD-10-CM

## 2020-10-12 LAB — POCT URINALYSIS DIP (PROADVANTAGE DEVICE)
Bilirubin, UA: NEGATIVE
Blood, UA: NEGATIVE
Glucose, UA: NEGATIVE mg/dL
Ketones, POC UA: NEGATIVE mg/dL
Leukocytes, UA: NEGATIVE
Nitrite, UA: NEGATIVE
Specific Gravity, Urine: 1.015
Urobilinogen, Ur: NEGATIVE
pH, UA: 7.5 (ref 5.0–8.0)

## 2020-10-12 NOTE — Patient Instructions (Addendum)
Call your GYN to discuss the results of your pathology from the recent colposcopy, so that you can arrange for any appropriate follow-up visits.  We referred you to neurosurgery for further evaluation of your back pain. Somebody should be contacting you.  Please try and drink more water (64 oz) every day, with the goal of seeing your urine very pale yellow or clear. The darker it is, may be mean that it is concentrated, and you need to drink more.  We briefly discussed that body aches sometimes can be from the cholesterol medication.  If you body aches continue, I recommend taking CoEnzyme Q10--this is an over-the-counter supplement that can help with muscle aches related to statin use.  If your muscle aches don't resolve (with more water and coenzyme Q10), then stop the atorvastatin for 1-2 weeks and see if it resolves.  If it does go away, we can then switch you to a different medication that maybe you will tolerate better.  You should be taking your HCTZ every day to treat/prevent high blood pressure (not only if/when blood pressure is high). I recommend taking this in the morning.  You can take it at the same time as your amlodipine (you can take amlodipine in the morning).  Taking the HCTZ at night may cause more interruptions in your sleep to go to the bathroom.  Please keep an regular check on your blood pressure.  It was high today at 142/100.  Goal is under 130/80. Return if it remains elevated when taking both medications regularly.  The discomfort at your belly button is a very small umbilical hernia. This does not need to be treated at this time.  Try and avoid straining (heavy lifting, or straining for a bowel movement (ie constipation), as this pressure might cause more discomfort.  If you notice a bulge that stays out and is very painful, then you might need to seek immediate evaluation.   Sometimes losing a little weight can lessen the size and discomfort from the  hernia.  Umbilical Hernia, Adult  A hernia is a bulge of tissue that pushes through an opening between muscles. An umbilical hernia happens in the abdomen, near the belly button (umbilicus). The hernia may contain tissues from the small intestine, large intestine, or fatty tissue covering the intestines (omentum). Umbilical hernias in adults tend to get worse over time, and they requiresurgical treatment. There are several types of umbilical hernias. You may have: A hernia located just above or below the umbilicus (indirect hernia). This is the most common type of umbilical hernia in adults. A hernia that forms through an opening formed by the umbilicus (direct hernia). A hernia that comes and goes (reducible hernia). A reducible hernia may be visible only when you strain, lift something heavy, or cough. This type of hernia can be pushed back into the abdomen (reduced). A hernia that traps abdominal tissue inside the hernia (incarcerated hernia). This type of hernia cannot be reduced. A hernia that cuts off blood flow to the tissues inside the hernia (strangulated hernia). The tissues can start to die if this happens. This type of hernia requires emergency treatment. What are the causes? An umbilical hernia happens when tissue inside the abdomen presses on a weakarea of the abdominal muscles. What increases the risk? You may have a greater risk of this condition if you: Are obese. Have had several pregnancies. Have a buildup of fluid inside your abdomen (ascites). Have had surgery that weakens the abdominal muscles. What are  the signs or symptoms? The main symptom of this condition is a painless bulge at or near the belly button. A reducible hernia may be visible only when you strain, lift something heavy, or cough. Other symptoms may include: Dull pain. A feeling of pressure. Symptoms of a strangulated hernia may include: Pain that gets increasingly worse. Nausea and vomiting. Pain when  pressing on the hernia. Skin over the hernia becoming red or purple. Constipation. Blood in the stool. How is this diagnosed? This condition may be diagnosed based on: A physical exam. You may be asked to cough or strain while standing. These actions increase the pressure inside your abdomen and force the hernia through the opening in your muscles. Your health care provider may try to reduce the hernia by pressing on it. Your symptoms and medical history. How is this treated? Surgery is the only treatment for an umbilical hernia. Surgery for a strangulated hernia is done as soon as possible. If you have a small herniathat is not incarcerated, you may need to lose weight before having surgery. Follow these instructions at home: Lose weight, if told by your health care provider. Do not try to push the hernia back in. Watch your hernia for any changes in color or size. Tell your health care provider if any changes occur. You may need to avoid activities that increase pressure on your hernia. Do not lift anything that is heavier than 10 lb (4.5 kg) until your health care provider says that this is safe. Take over-the-counter and prescription medicines only as told by your health care provider. Keep all follow-up visits as told by your health care provider. This is important. Contact a health care provider if: Your hernia gets larger. Your hernia becomes painful. Get help right away if: You develop sudden, severe pain near the area of your hernia. You have pain as well as nausea or vomiting. You have pain and the skin over your hernia changes color. You develop a fever. This information is not intended to replace advice given to you by your health care provider. Make sure you discuss any questions you have with your healthcare provider. Document Revised: 05/03/2017 Document Reviewed: 09/19/2016 Elsevier Patient Education  [place].

## 2020-10-13 ENCOUNTER — Telehealth: Payer: Self-pay

## 2020-10-13 LAB — COMPREHENSIVE METABOLIC PANEL
ALT: 20 IU/L (ref 0–32)
AST: 13 IU/L (ref 0–40)
Albumin/Globulin Ratio: 1.7 (ref 1.2–2.2)
Albumin: 4.5 g/dL (ref 3.8–4.9)
Alkaline Phosphatase: 78 IU/L (ref 44–121)
BUN/Creatinine Ratio: 8 — ABNORMAL LOW (ref 9–23)
BUN: 7 mg/dL (ref 6–24)
Bilirubin Total: 0.4 mg/dL (ref 0.0–1.2)
CO2: 24 mmol/L (ref 20–29)
Calcium: 10 mg/dL (ref 8.7–10.2)
Chloride: 106 mmol/L (ref 96–106)
Creatinine, Ser: 0.85 mg/dL (ref 0.57–1.00)
Globulin, Total: 2.7 g/dL (ref 1.5–4.5)
Glucose: 101 mg/dL — ABNORMAL HIGH (ref 65–99)
Potassium: 4.9 mmol/L (ref 3.5–5.2)
Sodium: 142 mmol/L (ref 134–144)
Total Protein: 7.2 g/dL (ref 6.0–8.5)
eGFR: 79 mL/min/{1.73_m2} (ref >59)

## 2020-10-13 NOTE — Telephone Encounter (Signed)
Spoke with patient's son and informed him of results and recommendation to repeat Pap smear in 6 mos. Visit scheduled for 04/15/20.

## 2020-10-13 NOTE — Progress Notes (Signed)
Pt was advised of labs. [place]

## 2020-10-13 NOTE — Telephone Encounter (Signed)
Patients son called re: biopsy results from 10/01/20.

## 2020-10-14 ENCOUNTER — Ambulatory Visit: Payer: BC Managed Care – PPO | Admitting: Surgical

## 2020-10-16 DIAGNOSIS — M5137 Other intervertebral disc degeneration, lumbosacral region: Secondary | ICD-10-CM | POA: Diagnosis not present

## 2020-10-16 DIAGNOSIS — M5442 Lumbago with sciatica, left side: Secondary | ICD-10-CM | POA: Diagnosis not present

## 2020-10-16 DIAGNOSIS — M5386 Other specified dorsopathies, lumbar region: Secondary | ICD-10-CM | POA: Diagnosis not present

## 2020-10-16 DIAGNOSIS — M5441 Lumbago with sciatica, right side: Secondary | ICD-10-CM | POA: Diagnosis not present

## 2020-10-19 DIAGNOSIS — M5386 Other specified dorsopathies, lumbar region: Secondary | ICD-10-CM | POA: Diagnosis not present

## 2020-10-19 DIAGNOSIS — M5441 Lumbago with sciatica, right side: Secondary | ICD-10-CM | POA: Diagnosis not present

## 2020-10-19 DIAGNOSIS — M5442 Lumbago with sciatica, left side: Secondary | ICD-10-CM | POA: Diagnosis not present

## 2020-10-19 DIAGNOSIS — M5137 Other intervertebral disc degeneration, lumbosacral region: Secondary | ICD-10-CM | POA: Diagnosis not present

## 2020-10-23 DIAGNOSIS — M5137 Other intervertebral disc degeneration, lumbosacral region: Secondary | ICD-10-CM | POA: Diagnosis not present

## 2020-10-23 DIAGNOSIS — M5386 Other specified dorsopathies, lumbar region: Secondary | ICD-10-CM | POA: Diagnosis not present

## 2020-10-23 DIAGNOSIS — M5442 Lumbago with sciatica, left side: Secondary | ICD-10-CM | POA: Diagnosis not present

## 2020-10-23 DIAGNOSIS — M5441 Lumbago with sciatica, right side: Secondary | ICD-10-CM | POA: Diagnosis not present

## 2020-10-26 DIAGNOSIS — M5416 Radiculopathy, lumbar region: Secondary | ICD-10-CM | POA: Diagnosis not present

## 2020-10-26 DIAGNOSIS — I10 Essential (primary) hypertension: Secondary | ICD-10-CM | POA: Diagnosis not present

## 2020-10-28 DIAGNOSIS — H43813 Vitreous degeneration, bilateral: Secondary | ICD-10-CM | POA: Diagnosis not present

## 2020-10-28 DIAGNOSIS — H401132 Primary open-angle glaucoma, bilateral, moderate stage: Secondary | ICD-10-CM | POA: Diagnosis not present

## 2020-11-03 DIAGNOSIS — M5117 Intervertebral disc disorders with radiculopathy, lumbosacral region: Secondary | ICD-10-CM | POA: Diagnosis not present

## 2020-11-03 DIAGNOSIS — M4727 Other spondylosis with radiculopathy, lumbosacral region: Secondary | ICD-10-CM | POA: Diagnosis not present

## 2020-11-03 DIAGNOSIS — M5416 Radiculopathy, lumbar region: Secondary | ICD-10-CM | POA: Diagnosis not present

## 2020-11-10 DIAGNOSIS — Z6835 Body mass index (BMI) 35.0-35.9, adult: Secondary | ICD-10-CM | POA: Diagnosis not present

## 2020-11-10 DIAGNOSIS — M4317 Spondylolisthesis, lumbosacral region: Secondary | ICD-10-CM | POA: Diagnosis not present

## 2020-11-10 DIAGNOSIS — M5416 Radiculopathy, lumbar region: Secondary | ICD-10-CM | POA: Diagnosis not present

## 2020-11-11 ENCOUNTER — Encounter: Payer: Self-pay | Admitting: Internal Medicine

## 2020-11-18 ENCOUNTER — Telehealth: Payer: Self-pay

## 2020-11-18 NOTE — Telephone Encounter (Signed)
PT WAS CALLED TO SCHEDULE HER PRE OP APPOINTMENT. LVM FOR PT TO CALL AND SCHEDULE AND FORM IN HOLD FOLDER UP FRONT. [place]

## 2020-11-18 NOTE — Telephone Encounter (Signed)
LVM for Lexine Baton trying to find out when pt is scheduled to have surgery . Form for clearance is at my desk in hold folder. [place]

## 2020-11-25 ENCOUNTER — Other Ambulatory Visit: Payer: Self-pay

## 2020-11-25 ENCOUNTER — Ambulatory Visit
Admission: RE | Admit: 2020-11-25 | Discharge: 2020-11-25 | Disposition: A | Discharge status: Home / Self Care | Payer: BC Managed Care – PPO | Source: Ambulatory Visit | Attending: Family Medicine | Admitting: Family Medicine

## 2020-11-25 DIAGNOSIS — Z1231 Encounter for screening mammogram for malignant neoplasm of breast: Secondary | ICD-10-CM | POA: Diagnosis not present

## 2020-12-01 ENCOUNTER — Institutional Professional Consult (permissible substitution): Payer: BC Managed Care – PPO | Admitting: Family Medicine

## 2020-12-08 ENCOUNTER — Institutional Professional Consult (permissible substitution): Payer: BC Managed Care – PPO | Admitting: Family Medicine

## 2020-12-14 ENCOUNTER — Encounter: Payer: Self-pay | Admitting: Family Medicine

## 2020-12-21 ENCOUNTER — Ambulatory Visit: Payer: BC Managed Care – PPO | Admitting: Orthopedic Surgery

## 2020-12-23 ENCOUNTER — Other Ambulatory Visit: Payer: Self-pay | Admitting: Family Medicine

## 2020-12-23 DIAGNOSIS — R7309 Other abnormal glucose: Secondary | ICD-10-CM

## 2021-01-04 ENCOUNTER — Ambulatory Visit: Payer: BC Managed Care – PPO | Admitting: Orthopedic Surgery

## 2021-01-06 ENCOUNTER — Encounter: Payer: BC Managed Care – PPO | Admitting: Family Medicine

## 2021-01-18 ENCOUNTER — Other Ambulatory Visit: Payer: Self-pay

## 2021-01-18 ENCOUNTER — Ambulatory Visit (INDEPENDENT_AMBULATORY_CARE_PROVIDER_SITE_OTHER): Payer: BC Managed Care – PPO | Admitting: Medical

## 2021-01-18 VITALS — BP 140/88 | HR 62 | Temp 97.3°F | Wt 189.8 lb

## 2021-01-18 DIAGNOSIS — R7301 Impaired fasting glucose: Secondary | ICD-10-CM

## 2021-01-18 DIAGNOSIS — R200 Anesthesia of skin: Secondary | ICD-10-CM | POA: Diagnosis not present

## 2021-01-18 DIAGNOSIS — G8929 Other chronic pain: Secondary | ICD-10-CM

## 2021-01-18 DIAGNOSIS — M542 Cervicalgia: Secondary | ICD-10-CM | POA: Diagnosis not present

## 2021-01-18 DIAGNOSIS — R202 Paresthesia of skin: Secondary | ICD-10-CM | POA: Diagnosis not present

## 2021-01-18 DIAGNOSIS — R5383 Other fatigue: Secondary | ICD-10-CM | POA: Diagnosis not present

## 2021-01-18 DIAGNOSIS — M791 Myalgia, unspecified site: Secondary | ICD-10-CM | POA: Diagnosis not present

## 2021-01-18 NOTE — Patient Instructions (Signed)
You have a lot of different symptoms today  I am checking some labs.  We know you have been borderline for diabetes, thus I am rechecking this as well  There are multiple causes for fatigue and numbness.    We will check some labs initially  Please go to [facility] for your neck xray.   Their hours are 8am - 4:30 pm Monday - Friday.  Take your insurance card with you.  [facility] [phone]  [address], [place], [place] [address]  [address] [place], [place] [address]

## 2021-01-18 NOTE — Progress Notes (Signed)
Subjective:  Amy [name] is a 60 y.o. female who presents for Chief Complaint  Patient presents with   having trouble moving neck    Having trouble moving neck. Fatigue, achy since august. Tingling on hands and feet. Pt states Dr. Tomi Bamberger saw her for these issues as well     Here for several concerns.  She notes ongoing problems with fatigue and tired.  She denies chest pain, shortness of breath, edema.  She does snore but no witnessed apnea.  She denies daytime somnolence or witnessed apnea or non restful sleep specifically  She notes ongoing numbness and tingling in the feet and hands bilaterally.  This is been going on for months.  No history of vitamin D deficiency  She notes pains all over, arms, legs, buttocks, neck, back.  She was on the cholesterol medicine but stopped after last visit due to the pains.  But that has not helped the pain.  She has not started back on the cholesterol medicine.  She was sleeping on the bed but never got comfortable.  She sleeps on multiple comforters on the floor.  She has seen a chiropractor in the past  She feels dizzy sometimes.  No heavy periods, no vaginal bleeding.  No bruising or bleeding otherwise.  No blood in the stool.  No urinary or gastric issues.  No polyuria, no polydipsia, no blurred vision  She denies eating a lot of junk food.  She eats a lot of traditional African food.  She eats a lot of yucca and corn.  She is from [place]  She is a non-smoker, no alcohol use either.  No other aggravating or relieving factors.    No other c/o.  Past Medical History:  Diagnosis Date   Abnormal Pap smear of cervix    Endometrial polyp 2016   [facility]   GERD (gastroesophageal reflux disease)    Glaucoma    Hypertension    Leiomyoma of uterus 04/29/2019   Prediabetes    Thickened endometrium 2016   per [facility] records   Vitamin D deficiency 01/15/2018   Current Outpatient Medications on File Prior to Visit  Medication  Sig Dispense Refill   amLODipine (NORVASC) 10 MG tablet Take 1 tablet (10 mg total) by mouth daily. 90 tablet 0   dorzolamide-timolol (COSOPT) 22.3-6.8 MG/ML ophthalmic solution 1 drop 2 (two) times daily.     hydrochlorothiazide (HYDRODIURIL) 12.5 MG tablet TAKE 1 TABLET(12.5 MG) BY MOUTH DAILY 90 tablet 1   LUMIGAN 0.01 % SOLN      metFORMIN (GLUCOPHAGE) 500 MG tablet TAKE 1 TABLET(500 MG) BY MOUTH DAILY WITH BREAKFAST 90 tablet 1   Multiple Vitamin (MULTIVITAMIN) tablet Take 1 tablet by mouth daily.     atorvastatin (LIPITOR) 10 MG tablet Take 1 tablet (10 mg total) by mouth daily. (Patient not taking: Reported on 01/18/2021) 90 tablet 1   No current facility-administered medications on file prior to visit.     The following portions of the patient's history were reviewed and updated as appropriate: allergies, current medications, past family history, past medical history, past social history, past surgical history and problem list.  ROS Otherwise as in subjective above  Objective: BP 140/88   Pulse 62   Temp (!) 97.3 F (36.3 C)   Wt 189 lb 12.8 oz (86.1 kg)   LMP 04/04/2012   BMI 34.71 kg/m   BP Readings from Last 3 Encounters:  01/18/21 140/88  10/12/20 (!) 142/100  10/01/20 130/78  Wt Readings from Last 3 Encounters:  01/18/21 189 lb 12.8 oz (86.1 kg)  10/12/20 190 lb 9.6 oz (86.5 kg)  10/01/20 191 lb (86.6 kg)    General appearance: alert, no distress, well developed, well nourished, African-American female Neck: Tender laterally bilateral and posterior neck, slightly decreased range of motion in general, otherwise supple, no lymphadenopathy, no thyromegaly, no masses Heart: RRR, normal S1, S2, no murmurs Lungs: CTA bilaterally, no wheezes, rhonchi, or rales Abdomen: +bs, soft, non tender, non distended, no masses, no hepatomegaly, no splenomegaly Back: Tender paraspinal lumbar region, tender over the right buttock sciatic region, otherwise no scoliosis or  deformity MSK: Tender on the right but otherwise legs nontender, arms nontender, no obvious decreased range of motion of arms or legs, no swelling or other deformity Neuro: Normal strength sensation and DTRs upper and lower extremity Pulses: 2+ radial pulses, 2+ pedal pulses, normal cap refill Ext: no edema   Assessment: Encounter Diagnoses  Name Primary?   Fatigue, unspecified type Yes   Chronic neck pain    Numbness and tingling    Myalgia    Impaired fasting blood sugar      Plan: You have a lot of different symptoms today  I am checking some labs.  We know you have been borderline for diabetes, thus I am rechecking this as well  There are multiple causes for fatigue and numbness.    We will check some labs initially  Please go to [facility] for your neck xray.   Their hours are 8am - 4:30 pm Monday - Friday.  Take your insurance card with you.  [facility] [phone]  [address], [place], [place] [address]  [address], [place] [address]    [name] was seen today for having trouble moving neck.  Diagnoses and all orders for this visit:  Fatigue, unspecified type -     Vitamin B12 -     Comprehensive metabolic panel  Chronic neck pain -     DG Cervical Spine Complete; Future  Numbness and tingling -     Vitamin B12 -     DG Cervical Spine Complete; Future  Myalgia -     CK -     Comprehensive metabolic panel  Impaired fasting blood sugar -     Hemoglobin A1c -     Comprehensive metabolic panel   Follow up: pending labs, xray

## 2021-01-19 ENCOUNTER — Ambulatory Visit
Admission: RE | Admit: 2021-01-19 | Discharge: 2021-01-19 | Disposition: A | Discharge status: Home / Self Care | Payer: BC Managed Care – PPO | Source: Ambulatory Visit | Attending: Medical | Admitting: Medical

## 2021-01-19 DIAGNOSIS — M542 Cervicalgia: Secondary | ICD-10-CM

## 2021-01-19 DIAGNOSIS — R200 Anesthesia of skin: Secondary | ICD-10-CM

## 2021-01-19 DIAGNOSIS — G8929 Other chronic pain: Secondary | ICD-10-CM

## 2021-01-19 LAB — COMPREHENSIVE METABOLIC PANEL
ALT: 28 IU/L (ref 0–32)
AST: 22 IU/L (ref 0–40)
Albumin/Globulin Ratio: 1.8 (ref 1.2–2.2)
Albumin: 4.4 g/dL (ref 3.8–4.9)
Alkaline Phosphatase: 74 IU/L (ref 44–121)
BUN/Creatinine Ratio: 17 (ref 9–23)
BUN: 17 mg/dL (ref 6–24)
Bilirubin Total: 0.3 mg/dL (ref 0.0–1.2)
CO2: 23 mmol/L (ref 20–29)
Calcium: 9.9 mg/dL (ref 8.7–10.2)
Chloride: 105 mmol/L (ref 96–106)
Creatinine, Ser: 1.02 mg/dL — ABNORMAL HIGH (ref 0.57–1.00)
Globulin, Total: 2.4 g/dL (ref 1.5–4.5)
Glucose: 110 mg/dL — ABNORMAL HIGH (ref 70–99)
Potassium: 4 mmol/L (ref 3.5–5.2)
Sodium: 142 mmol/L (ref 134–144)
Total Protein: 6.8 g/dL (ref 6.0–8.5)
eGFR: 63 mL/min/{1.73_m2} (ref >59)

## 2021-01-19 LAB — HEMOGLOBIN A1C
Est. average glucose Bld gHb Est-mCnc: 140 mg/dL
Hgb A1c MFr Bld: 6.5 % — ABNORMAL HIGH (ref 4.8–5.6)

## 2021-01-19 LAB — CK: Total CK: 340 U/L — ABNORMAL HIGH (ref 32–182)

## 2021-01-19 LAB — VITAMIN B12: Vitamin B-12: >2000 pg/mL — ABNORMAL HIGH (ref 232–1245)

## 2021-01-20 ENCOUNTER — Other Ambulatory Visit: Payer: Self-pay | Admitting: Medical

## 2021-01-20 DIAGNOSIS — M5410 Radiculopathy, site unspecified: Secondary | ICD-10-CM

## 2021-01-20 DIAGNOSIS — M542 Cervicalgia: Secondary | ICD-10-CM

## 2021-01-20 DIAGNOSIS — M545 Low back pain, unspecified: Secondary | ICD-10-CM

## 2021-01-20 DIAGNOSIS — R202 Paresthesia of skin: Secondary | ICD-10-CM

## 2021-01-20 DIAGNOSIS — M79601 Pain in right arm: Secondary | ICD-10-CM

## 2021-01-20 DIAGNOSIS — G8929 Other chronic pain: Secondary | ICD-10-CM

## 2021-01-28 ENCOUNTER — Telehealth: Payer: Self-pay | Admitting: Medical

## 2021-01-28 NOTE — Telephone Encounter (Signed)
I received surgery clearance request.  Please schedule for consult on this including EKG  Hold form until visit

## 2021-01-28 NOTE — Telephone Encounter (Signed)
This has been put in Dr. Tomi Bamberger folder since she saw her for this back in July and Referred her to neurosurgery as you have not seen her for this. Adam was going to send this to Dr. Tomi Bamberger to see if she will sign off before appt

## 2021-02-01 NOTE — Telephone Encounter (Signed)
Per Dr.Knapp patient needs an office visit with Audelia Acton fo rthis, called son and notified, he is going to look at his schedule and hers and call back to schedule pre-op clearance apt. Appointment is in brown folder up front

## 2021-02-02 DIAGNOSIS — M545 Low back pain, unspecified: Secondary | ICD-10-CM | POA: Diagnosis not present

## 2021-02-02 DIAGNOSIS — M542 Cervicalgia: Secondary | ICD-10-CM | POA: Diagnosis not present

## 2021-02-16 ENCOUNTER — Ambulatory Visit (INDEPENDENT_AMBULATORY_CARE_PROVIDER_SITE_OTHER): Payer: BC Managed Care – PPO | Admitting: Medical

## 2021-02-16 ENCOUNTER — Encounter: Payer: Self-pay | Admitting: Medical

## 2021-02-16 ENCOUNTER — Other Ambulatory Visit: Payer: Self-pay

## 2021-02-16 VITALS — BP 130/78 | HR 75 | Temp 98.6°F | Ht 63.0 in | Wt 197.4 lb

## 2021-02-16 DIAGNOSIS — Z01818 Encounter for other preprocedural examination: Secondary | ICD-10-CM | POA: Insufficient documentation

## 2021-02-16 DIAGNOSIS — M545 Low back pain, unspecified: Secondary | ICD-10-CM

## 2021-02-16 DIAGNOSIS — Z136 Encounter for screening for cardiovascular disorders: Secondary | ICD-10-CM | POA: Diagnosis not present

## 2021-02-16 DIAGNOSIS — I10 Essential (primary) hypertension: Secondary | ICD-10-CM

## 2021-02-16 DIAGNOSIS — R7303 Prediabetes: Secondary | ICD-10-CM | POA: Diagnosis not present

## 2021-02-16 DIAGNOSIS — K219 Gastro-esophageal reflux disease without esophagitis: Secondary | ICD-10-CM

## 2021-02-16 DIAGNOSIS — E1169 Type 2 diabetes mellitus with other specified complication: Secondary | ICD-10-CM | POA: Insufficient documentation

## 2021-02-16 DIAGNOSIS — R202 Paresthesia of skin: Secondary | ICD-10-CM

## 2021-02-16 DIAGNOSIS — E7800 Pure hypercholesterolemia, unspecified: Secondary | ICD-10-CM

## 2021-02-16 DIAGNOSIS — G8929 Other chronic pain: Secondary | ICD-10-CM

## 2021-02-16 DIAGNOSIS — E1069 Type 1 diabetes mellitus with other specified complication: Secondary | ICD-10-CM | POA: Insufficient documentation

## 2021-02-16 DIAGNOSIS — E669 Obesity, unspecified: Secondary | ICD-10-CM

## 2021-02-16 NOTE — Progress Notes (Signed)
Subjective:  Amy [name] is a 60 y.o. female who presents for Chief Complaint  Patient presents with   Annual Exam    CPE also has back pain long time fell at work about 5 years ago never reported it also clearance for surgery supposed to have back surgery. Pt. See's obgyn soon.      Here for preop.  She is supposed be having neurosurgery with Dr. Duffy [name] for lumbar fusion soon.  She has no particular complaint today other than ongoing back pain and numbness throughout hands and feet bilaterally.  Diabetes-she does not have a glucometer and is not checking blood sugars.  She denies any recent sweats, polyuria polydipsia or weight change.  No chest pain, dyspnea, edema, palpiations, DOE.  Takes metformin daily.    Not compliant with statin, and didn't give any particular reason why.   Uses HCTZ sometimes if BP elevated but is compliant with amlodipine daily.  No hx/o heart disease.  No prior cardiology consult.    Last visit B12 was elevated but she is taking a Wellwoman 50 vitamin daily.  No other aggravating or relieving factors.    No other c/o.  Past Medical History:  Diagnosis Date   Abnormal Pap smear of cervix    Endometrial polyp 2016   [facility]   GERD (gastroesophageal reflux disease)    Glaucoma    Hypertension    Leiomyoma of uterus 04/29/2019   Prediabetes    Thickened endometrium 2016   per [facility] records   Vitamin D deficiency 01/15/2018   Current Outpatient Medications on File Prior to Visit  Medication Sig Dispense Refill   amLODipine (NORVASC) 10 MG tablet Take 1 tablet (10 mg total) by mouth daily. 90 tablet 0   dorzolamide-timolol (COSOPT) 22.3-6.8 MG/ML ophthalmic solution 1 drop 2 (two) times daily.     LUMIGAN 0.01 % SOLN      metFORMIN (GLUCOPHAGE) 500 MG tablet TAKE 1 TABLET(500 MG) BY MOUTH DAILY WITH BREAKFAST 90 tablet 1   Multiple Vitamin (MULTIVITAMIN) tablet Take 1 tablet by mouth daily.     atorvastatin (LIPITOR) 10  MG tablet Take 1 tablet (10 mg total) by mouth daily. (Patient not taking: No sig reported) 90 tablet 1   hydrochlorothiazide (HYDRODIURIL) 12.5 MG tablet TAKE 1 TABLET(12.5 MG) BY MOUTH DAILY (Patient not taking: Reported on 02/16/2021) 90 tablet 1   No current facility-administered medications on file prior to visit.    Family History  Problem Relation Age of Onset   Hypertension Mother    Heart failure Mother    Hypertension Father    Hypertension Sister    Hypertension Brother    Past Surgical History:  Procedure Laterality Date   CESAREAN SECTION     DILATION AND CURETTAGE OF UTERUS  2016     The following portions of the patient's history were reviewed and updated as appropriate: allergies, current medications, past family history, past medical history, past social history, past surgical history and problem list.  ROS Otherwise as in subjective above    Objective: BP 130/78   Pulse 75   Temp 98.6 F (37 C)   Ht 5\' 3"  (1.6 m)   Wt 197 lb 6.4 oz (89.5 kg)   LMP 04/04/2012   BMI 34.97 kg/m   BP Readings from Last 3 Encounters:  02/16/21 130/78  01/18/21 140/88  10/12/20 (!) 142/100   Wt Readings from Last 3 Encounters:  02/16/21 197 lb 6.4 oz (89.5 kg)  01/18/21 189 lb 12.8 oz (86.1 kg)  10/12/20 190 lb 9.6 oz (86.5 kg)    General appearance: alert, no distress, well developed, well nourished, African American female HEENT: normocephalic, sclerae anicteric, conjunctiva pink and moist, nares patent, no discharge or erythema, pharynx normal, no enlarged tonsils Oral cavity: MMM, no lesions Neck: supple, no lymphadenopathy, no thyromegaly, no masses, no bruits Heart: RRR, normal S1, S2, no murmurs Lungs: CTA bilaterally, no wheezes, rhonchi, or rales Abdomen: +bs, soft, non tender, non distended, no masses, no hepatomegaly, no splenomegaly Pulses: 2+ radial pulses, 2+ pedal pulses, normal cap refill Ext: no edema Neuro: cn2-12 intact, no cerebellar  abnormality   EKG Screening for heart disease, rate 63 bpm, PR 148 ms, QRS 74 ms, QTC 405 ms, axis 31 degrees, normal sinus rhythm with sinus arrhythmia  Normal ABIs for lower extremities screen 07/2019.  CXR 12/2018 normal, no cardiomegaly  I reviewed her labs from January 18, 2021 showing blood glucose 110, creatinine 1.02 but otherwise [facility] metabolic panel normal  hemoglobin A1c 6.5% January 18, 2021 and all of her readings over the past 3 years have been less than 6.5%     Assessment: Encounter Diagnoses  Name Primary?   Preop examination Yes   Diabetes mellitus type 2 in obese ([place])    Screening for heart disease    Prediabetes    Obesity (BMI 30-39.9)    Gastroesophageal reflux disease, unspecified whether esophagitis present    Essential hypertension    Chronic bilateral low back pain without sciatica    Paresthesia    Elevated LDL cholesterol level      Plan: Reviewed chart history.  I reviewed recent labs.  We will labs as below today  EKG reviewed.  No new worrisome findings.  Diabetes under pretty good control.  I did recommend that she check her blood sugars 2 to 3 days/week fasting in the mornings.  Prescription for glucometer given  High blood pressure-I recommend that she continue amlodipine daily begin back on hydrochlorothiazide every day not just as needed.  Hydrate well with 80 to 100 ounces of water daily  Obesity-needs work on losing weight.  At this time as long as the blood count and coags look okay she will be cleared for surgery.  We will call back with those results    Amy [name] was seen today for annual exam.  Diagnoses and all orders for this visit:  Preop examination -     EKG 12-Lead -     CBC with Differential/Platelet -     PT and PTT  Diabetes mellitus type 2 in obese ([place])  Screening for heart disease -     EKG 12-Lead  Prediabetes  Obesity (BMI 30-39.9)  Gastroesophageal reflux disease, unspecified whether esophagitis  present  Essential hypertension  Chronic bilateral low back pain without sciatica  Paresthesia  Elevated LDL cholesterol level   Follow up: with neurosurgery

## 2021-02-17 ENCOUNTER — Encounter: Payer: Self-pay | Admitting: Family Medicine

## 2021-02-17 LAB — PT AND PTT
INR: 1 (ref 0.9–1.2)
Prothrombin Time: 10.4 s (ref 9.1–12.0)
aPTT: 29 s (ref 24–33)

## 2021-02-17 LAB — CBC WITH DIFFERENTIAL/PLATELET
Basophils Absolute: 0 10*3/uL (ref 0.0–0.2)
Basos: 1 %
EOS (ABSOLUTE): 0.3 10*3/uL (ref 0.0–0.4)
Eos: 5 %
Hematocrit: 43.5 % (ref 34.0–46.6)
Hemoglobin: 14 g/dL (ref 11.1–15.9)
Immature Grans (Abs): 0 10*3/uL (ref 0.0–0.1)
Immature Granulocytes: 0 %
Lymphocytes Absolute: 2.4 10*3/uL (ref 0.7–3.1)
Lymphs: 43 %
MCH: 30.6 pg (ref 26.6–33.0)
MCHC: 32.2 g/dL (ref 31.5–35.7)
MCV: 95 fL (ref 79–97)
Monocytes Absolute: 0.4 10*3/uL (ref 0.1–0.9)
Monocytes: 8 %
Neutrophils Absolute: 2.4 10*3/uL (ref 1.4–7.0)
Neutrophils: 43 %
Platelets: 343 10*3/uL (ref 150–450)
RBC: 4.58 x10E6/uL (ref 3.77–5.28)
RDW: 12.6 % (ref 11.7–15.4)
WBC: 5.5 10*3/uL (ref 3.4–10.8)

## 2021-02-23 DIAGNOSIS — M542 Cervicalgia: Secondary | ICD-10-CM | POA: Diagnosis not present

## 2021-03-01 ENCOUNTER — Ambulatory Visit (INDEPENDENT_AMBULATORY_CARE_PROVIDER_SITE_OTHER): Payer: BC Managed Care – PPO | Admitting: Obstetrics & Gynecology

## 2021-03-01 ENCOUNTER — Other Ambulatory Visit: Payer: Self-pay

## 2021-03-01 ENCOUNTER — Encounter: Payer: Self-pay | Admitting: Obstetrics & Gynecology

## 2021-03-01 VITALS — BP 118/72 | HR 80 | Wt 200.0 lb

## 2021-03-01 DIAGNOSIS — R300 Dysuria: Secondary | ICD-10-CM | POA: Diagnosis not present

## 2021-03-01 DIAGNOSIS — N898 Other specified noninflammatory disorders of vagina: Secondary | ICD-10-CM

## 2021-03-01 LAB — WET PREP FOR TRICH, YEAST, CLUE

## 2021-03-01 NOTE — Progress Notes (Signed)
    Amy [name] 29-Oct-1960 [id number]        60 y.o.  [id number]   RP: Vaginal itching x a few days.  HPI: Postmenopausal, well on no HRT.  No PMB.  Pap LGSIL 07/2020.  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  C/O Vaginal itching x a few days. Some discomfort when passing urine.  No pelvic pain.  Abstinent.  No fever.    OB History  Gravida Para Term Preterm AB Living  $Remov'4       1 3  'VKCEjK$ SAB IAB Ectopic Multiple Live Births  1       3    # Outcome Date GA Lbr Len/2nd Weight Sex Delivery Anes PTL Lv  4 Gravida           3 Gravida           2 Gravida           1 SAB             Past medical history,surgical history, problem list, medications, allergies, family history and social history were all reviewed and documented in the EPIC chart.   Directed ROS with pertinent positives and negatives documented in the history of present illness/assessment and plan.  Exam:  Vitals:   03/01/21 1558  BP: 118/72  Pulse: 80  SpO2: 99%  Weight: 200 lb (90.7 kg)   General appearance:  Normal  Abdomen: Normal  Gynecologic exam: Vulva normal.  Speculum:  Cervix/Vagina normal.  Normal secretions.  Wet prep done.  Wet prep Neg U/A: Yellow clear, Nit Neg, Pro Neg, WBC 0-5, RBC 0-2, Bacteria Few.  U. Culture pending.   Assessment/Plan:  60 y.o. [id number]   1. Vaginal itching Wet prep negative, reassured.  Recommend trying Hydrocortisone 1% cream at the vulva for possible mild inflammation.  Schedule Annual Gyn exam with Pap test.  - WET PREP FOR [place], YEAST, CLUE  2. Dysuria U/A minimally perturbed, will wait on U. Culture to decide if treatment is needed. - Urinalysis,Complete w/RFL Culture  Other orders - Blood Glucose Monitoring Suppl (ONETOUCH VERIO FLEX SYSTEM) w/Device KIT; USE TO TEST DAILY. - ONETOUCH VERIO test strip; daily. - Lancets (ONETOUCH DELICA PLUS [id number]) [place]; daily. - Urine Culture - REFLEXIVE URINE CULTURE   Princess Bruins MD, 4:14 PM 03/01/2021

## 2021-03-01 NOTE — Progress Notes (Signed)
Cardiology Office Note:    Date:  03/11/2021   ID:  [name], DOB 02-03-1961, MRN [id number]  PCP:  Girtha Rm, PA-C  Cardiologist:  Donato Heinz, MD  Electrophysiologist:  None   Referring MD: Girtha Rm, PA-C   Chief Complaint: bilateral leg pain  History of Present Illness:    Amy [name] is a 60 y.o. female with a history of hypertension, hyperlipidemia, prediabetes, and GERD who is followed by Dr. Gardiner Rhyme and presents today for bilateral leg pain.  Patient was referred to Dr. Gardiner Rhyme in 02/2019 for follow-up of recent ED visit for chest pain. Troponin was negative in the ED but she left before being seen by a provider. She described the pain as a sharp intermittent pain in the center of her chest that was not associated with exertion. Pain was felt to be atypical but coronary CTA was ordered for further evaluation. However, she never had this done because symptoms improved after she starting taking a PPI. She was last seen by Dr. Gardiner Rhyme in 06/2019 at which time she denied any chest pain but did report pain in the back of her legs when walking concerning for claudication. Therefore, ABIs were ordered and were normal.   Patient presents today for evaluation of persistent leg pain. Here alone.  Patient reports worsening leg pain.  She states it is similar to the symptoms she reported last year but worse.  She has a hard time fully describing the pain but from what I can gather it sounds like a stabbing pain all the way down the front and back of her legs.  She states the pain is all the time (both at rest and with exertion).  It does not sound like claudication.  She has good posterior tibial and distal pedal pulses bilaterally as well as good cap refill.  No skin discoloration of her feet.  I do not think this is due to PAD.  She also has significant lower back pain and has had multiple steroid injections in the past but I think this is all coming from her back.   It sounds like Ortho may be considering surgery at some point but patient is reluctant to have surgery and would prefer to find a medicine which helps instead.  She is doing well from a cardiac standpoint.  History of bad reflux that has been well controlled on medications.  She notes occasional reflux if she eats certain foods.  However, no other chest pain.  She denies any shortness of breath, orthopnea, PND, lower extremity swelling.  She notes some lightheadedness when her leg pain gets really bad but no syncope.  Note some fatigue.  Her activity is limited by her severe back/leg pain; however, she is still able to complete greater than 4 METS.  Past Medical History:  Diagnosis Date   Abnormal Pap smear of cervix    Endometrial polyp 2016   [facility]   GERD (gastroesophageal reflux disease)    Glaucoma    Hypertension    Leiomyoma of uterus 04/29/2019   Prediabetes    Thickened endometrium 2016   per [facility] records   Vitamin D deficiency 01/15/2018    Past Surgical History:  Procedure Laterality Date   CESAREAN SECTION     DILATION AND CURETTAGE OF UTERUS  2016    Current Medications: Current Meds  Medication Sig   amLODipine (NORVASC) 10 MG tablet Take 1 tablet (10 mg total) by mouth daily.  Blood Glucose Monitoring Suppl (ONETOUCH VERIO FLEX SYSTEM) w/Device KIT USE TO TEST DAILY.   dorzolamide-timolol (COSOPT) 22.3-6.8 MG/ML ophthalmic solution 1 drop 2 (two) times daily.   hydrochlorothiazide (HYDRODIURIL) 12.5 MG tablet TAKE 1 TABLET(12.5 MG) BY MOUTH DAILY   Lancets (ONETOUCH DELICA PLUS [id number]) MISC daily.   LUMIGAN 0.01 % SOLN    metFORMIN (GLUCOPHAGE) 500 MG tablet TAKE 1 TABLET(500 MG) BY MOUTH DAILY WITH BREAKFAST   Multiple Vitamin (MULTIVITAMIN) tablet Take 1 tablet by mouth daily.   ONETOUCH VERIO test strip daily.     Allergies:   Patient has no known allergies.   Social History   Socioeconomic History   Marital status: Single     Spouse name: Not on file   Number of children: Not on file   Years of education: Not on file   Highest education level: Not on file  Occupational History   Not on file  Tobacco Use   Smoking status: Never   Smokeless tobacco: Never  Substance and Sexual Activity   Alcohol use: No   Drug use: No   Sexual activity: Not Currently    Birth control/protection: Abstinence  Other Topics Concern   Not on file  Social History Narrative   Not on file   Social Determinants of Health   Financial Resource Strain: Not on file  Food Insecurity: Not on file  Transportation Needs: Not on file  Physical Activity: Not on file  Stress: Not on file  Social Connections: Not on file     Family History: The patient's family history includes Heart failure in her mother; Hypertension in her brother, father, mother, and sister.  ROS:   Please see the history of present illness.     EKGs/Labs/Other Studies Reviewed:    The following studies were reviewed today:  Lower Extremity Dopplers/ABIs 07/05/2019: Summary:  Right: Resting right ankle-brachial index is within normal range. No  evidence of significant right lower extremity arterial disease. The right  toe-brachial index is normal.   Left: Resting left ankle-brachial index is within normal range. No  evidence of significant left lower extremity arterial disease. The left  toe-brachial index is normal.   EKG:  EKG not ordered today. Most recent EKG on 02/12/2021 personally reviewed and demonstrates: Normal sinus rhythm, rate 63 bpm, with non-specific ST/T changes. Normal axis. PR and QRS intervals. QTc 405 ms.  Recent Labs: 06/26/2020: TSH 1.150 01/18/2021: ALT 28; BUN 17; Creatinine, Ser 1.02; Potassium 4.0; Sodium 142 02/16/2021: Hemoglobin 14.0; Platelets 343  Recent Lipid Panel    Component Value Date/Time   CHOL 109 07/31/2020 1336   TRIG 41 07/31/2020 1336   HDL 46 07/31/2020 1336   CHOLHDL 2.4 07/31/2020 1336   LDLCALC 52  07/31/2020 1336    Physical Exam:    Vital Signs: BP 122/84   Pulse 83   Ht '5\' 2"'  (1.575 m)   Wt 198 lb 3.2 oz (89.9 kg)   LMP 04/04/2012   SpO2 98%   BMI 36.25 kg/m     Wt Readings from Last 3 Encounters:  03/11/21 198 lb 3.2 oz (89.9 kg)  03/01/21 200 lb (90.7 kg)  02/16/21 197 lb 6.4 oz (89.5 kg)     General: 60 y.o. female in no acute distress. HEENT: Normocephalic and atraumatic. Sclera clear. EOMs intact. Neck: Supple. No carotid bruits. No JVD. Heart: RRR. Distinct S1 and S2. No murmurs, gallops, or rubs. Radial, posterior tibial, and distal pedal pulses 2+ and equal bilaterally. Lungs: No  increased work of breathing. Clear to ausculation bilaterally. No wheezes, rhonchi, or rales.  Abdomen: Soft, non-distended, and non-tender to palpation.  Extremities: No lower extremity edema. Good cap refill of digits on bilateral lower extremities. Skin: Warm and dry. Neuro: Alert and oriented x3. No focal deficits. Psych: Normal affect. Responds appropriately.  Assessment:    1. Pain in both lower extremities   2. Pre-op evaluation   3. History of chest pain   4. Gastroesophageal reflux disease, unspecified whether esophagitis present   5. Primary hypertension   6. Hyperlipidemia, unspecified hyperlipidemia type     Plan:    Leg Pain  Pre-Op Patient continues to have severe bilateral leg pain both at rest and with exertion that has gotten worse since last visit. ABIs in 06/2019 at 1.24 bilaterally. TBI's were also normal.  Description of pain does not sound like claudication.  She has severe lower back pain as well and has had multiple steroid injections in the past.  She has a good posterior tibial and distal pedal pulses bilaterally.  Her lower extremities are warm to the touch and she has good cap refill I do not think her leg pain is due to PAD.  Suspect this is all coming from her lower back/sciatica.  No additional vascular work-up necessary at this time.  It sounds like  Ortho may be planning a spinal surgery at some point; however, patient seems very reluctant to have surgery and would like to try medications first.  Discussed that Gabapentin is sometimes helpful for nerve pain and recommended she discuss this with Ortho or PCP.  As far as potential surgery goes, I think she is at acceptable risk for spinal surgery if needed without any additional cardiac work-up.  Her activity is limited by back/leg pain but she is still able to complete greater than 4 METS.  Per Revised Cardiac Risk index, considered very low risk with a 0.4% chance of an adverse cardiac event perioperatively.  History of Chest Pain GERD History of chest pain that resolved after starting a PPI.   - Recent EKG on 02/16/2021 showed no acute ischemic changes. - She occasionally has heartburn after eating certain foods but no other chest pain. No angina.  - No additional work-up.   Hypertension BP well controlled.  - Continue Amlodipine 32m daily and HCTZ 12.526mdaily.  Hyperlipidemia Lipid panel in 07/2020: Total Cholesterol 109, Triglycerides 41, HDL 46, LDL 52. - Continue Lipitor 10g daily. - Labs followed by PCP.  Disposition: Follow up in 1 year.    Medication Adjustments/Labs and Tests Ordered: Current medicines are reviewed at length with the patient today.  Concerns regarding medicines are outlined above.  No orders of the defined types were placed in this encounter.  No orders of the defined types were placed in this encounter.   Patient Instructions  Medication Instructions:  Your physician recommends that you continue on your current medications as directed. Please refer to the Current Medication list given to you today.  *If you need a refill on your cardiac medications before your next appointment, please call your pharmacy*  Lab Work: NONE ordered at this time of appointment   If you have labs (blood work) drawn today and your tests are completely normal, you will  receive your results only by: [place] OR A paper copy in the mail If you have any lab test that is abnormal or we need to change your treatment, we will call you to  review the results.  Testing/Procedures: NONE ordered at this time of appointment   Follow-Up: At [facility], you and your health needs are our priority.  As part of our continuing mission to provide you with exceptional heart care, we have created designated Provider Care Teams.  These Care Teams include your primary Cardiologist (physician) and Advanced Practice Providers (APPs -  Physician Assistants and Nurse Practitioners) who all work together to provide you with the care you need, when you need it.   Your next appointment:   1 year(s)  The format for your next appointment:   In Person  Provider:   Donato Heinz, MD    Other Instructions Call and get an appointment scheduled with your Primary Care Physician/Doctor (PCP) to talk about Gabapentin or with your Orthopaedic Physician/Doctor      Signed, Darreld Mclean, PA-C  03/11/2021 11:36 AM    [place]

## 2021-03-03 DIAGNOSIS — H43813 Vitreous degeneration, bilateral: Secondary | ICD-10-CM | POA: Diagnosis not present

## 2021-03-03 DIAGNOSIS — H401132 Primary open-angle glaucoma, bilateral, moderate stage: Secondary | ICD-10-CM | POA: Diagnosis not present

## 2021-03-03 LAB — URINALYSIS, COMPLETE W/RFL CULTURE
Bilirubin Urine: NEGATIVE
Glucose, UA: NEGATIVE
Hyaline Cast: NONE SEEN /LPF
Ketones, ur: NEGATIVE
Leukocyte Esterase: NEGATIVE
Nitrites, Initial: NEGATIVE
Protein, ur: NEGATIVE
Specific Gravity, Urine: 1.02 (ref 1.001–1.035)
pH: 5.5 (ref 5.0–8.0)

## 2021-03-03 LAB — URINE CULTURE
MICRO NUMBER:: 12683638
SPECIMEN QUALITY:: ADEQUATE

## 2021-03-03 LAB — CULTURE INDICATED

## 2021-03-11 ENCOUNTER — Other Ambulatory Visit: Payer: Self-pay

## 2021-03-11 ENCOUNTER — Encounter: Payer: Self-pay | Admitting: Student

## 2021-03-11 ENCOUNTER — Ambulatory Visit (INDEPENDENT_AMBULATORY_CARE_PROVIDER_SITE_OTHER): Payer: BC Managed Care – PPO | Admitting: Student

## 2021-03-11 VITALS — BP 122/84 | HR 83 | Ht 62.0 in | Wt 198.2 lb

## 2021-03-11 DIAGNOSIS — M79604 Pain in right leg: Secondary | ICD-10-CM | POA: Diagnosis not present

## 2021-03-11 DIAGNOSIS — Z01818 Encounter for other preprocedural examination: Secondary | ICD-10-CM

## 2021-03-11 DIAGNOSIS — Z01810 Encounter for preprocedural cardiovascular examination: Secondary | ICD-10-CM | POA: Diagnosis not present

## 2021-03-11 DIAGNOSIS — Z87898 Personal history of other specified conditions: Secondary | ICD-10-CM | POA: Diagnosis not present

## 2021-03-11 DIAGNOSIS — I10 Essential (primary) hypertension: Secondary | ICD-10-CM

## 2021-03-11 DIAGNOSIS — E785 Hyperlipidemia, unspecified: Secondary | ICD-10-CM

## 2021-03-11 DIAGNOSIS — K219 Gastro-esophageal reflux disease without esophagitis: Secondary | ICD-10-CM

## 2021-03-11 DIAGNOSIS — M79605 Pain in left leg: Secondary | ICD-10-CM

## 2021-03-11 NOTE — Patient Instructions (Signed)
Medication Instructions:  Your physician recommends that you continue on your current medications as directed. Please refer to the Current Medication list given to you today.  *If you need a refill on your cardiac medications before your next appointment, please call your pharmacy*  Lab Work: NONE ordered at this time of appointment   If you have labs (blood work) drawn today and your tests are completely normal, you will receive your results only by: [place] (if you have MyChart) OR A paper copy in the mail If you have any lab test that is abnormal or we need to change your treatment, we will call you to review the results.  Testing/Procedures: NONE ordered at this time of appointment   Follow-Up: At [facility], you and your health needs are our priority.  As part of our continuing mission to provide you with exceptional heart care, we have created designated Provider Care Teams.  These Care Teams include your primary Cardiologist (physician) and Advanced Practice Providers (APPs -  Physician Assistants and Nurse Practitioners) who all work together to provide you with the care you need, when you need it.   Your next appointment:   1 year(s)  The format for your next appointment:   In Person  Provider:   Donato Heinz, MD    Other Instructions Call and get an appointment scheduled with your Primary Care Physician/Doctor (PCP) to talk about Gabapentin or with your Orthopaedic Physician/Doctor

## 2021-03-17 ENCOUNTER — Other Ambulatory Visit: Payer: Self-pay

## 2021-03-17 ENCOUNTER — Ambulatory Visit: Payer: Self-pay

## 2021-03-17 ENCOUNTER — Ambulatory Visit (INDEPENDENT_AMBULATORY_CARE_PROVIDER_SITE_OTHER): Payer: BC Managed Care – PPO | Admitting: Orthopedic Surgery

## 2021-03-17 DIAGNOSIS — G8929 Other chronic pain: Secondary | ICD-10-CM

## 2021-03-17 DIAGNOSIS — M5441 Lumbago with sciatica, right side: Secondary | ICD-10-CM

## 2021-03-17 DIAGNOSIS — M5410 Radiculopathy, site unspecified: Secondary | ICD-10-CM

## 2021-03-17 DIAGNOSIS — M5442 Lumbago with sciatica, left side: Secondary | ICD-10-CM

## 2021-03-17 MED ORDER — GABAPENTIN 100 MG PO CAPS: 100.0000 mg | ORAL_CAPSULE | Freq: Two times a day (BID) | ORAL | 0 refills | Status: DC

## 2021-03-21 ENCOUNTER — Encounter: Payer: Self-pay | Admitting: Orthopedic Surgery

## 2021-03-21 NOTE — Progress Notes (Signed)
Office Visit Note   [name]           Date of Birth: 1960-10-07           MRN: [id number] Visit Date: 03/17/2021 Requested by: Girtha Rm, PA-C No address on file PCP: Girtha Rm, PA-C  Subjective: Chief Complaint  Patient presents with   Lower Back - Pain    HPI: Amy [name] is a 59 y.o. female who presents to the office complaining of severe low back pain with bilateral leg radicular pain.  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  She has been seen previously in the office and referred to Dr. Laurence Spates for lumbar spine ESI's but the last 3 injections have provided no relief whatsoever.  She has tried physical therapy in [place] without much help.  She has previously talked with her son (who is a physician) and Dr. Ernestina Patches about ablation.  She has seen neurosurgery who recommended surgical intervention with the lack of relief from injections recently.  She has had 1 injection in the past about 4 injections ago that did provide relief of her symptoms.  She came to this office after seeing the neurosurgeon as she is afraid of surgery.  She denies any bowel or bladder incontinence.  Denies any saddle anesthesia.  Radicular pain travels down both legs to the bottom of her feet..                ROS: All systems reviewed are negative as they relate to the chief complaint within the history of present illness.  Patient denies fevers or chills.  Assessment & Plan: Visit Diagnoses:  1. Chronic midline low back pain with bilateral sciatica   2. Radicular syndrome of right leg     Plan: Patient is a 59 year old female who presents for repeat evaluation of low back pain with radicular leg pain.  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  Discussed the options available to patient; with no relief from medications, physical therapy, ESI's, her main options are living with her symptoms versus surgical intervention by neurosurgery versus ablation.  Her main complaint of pain is back pain rather than leg pain.  Did advise her today that surgery is a reasonable option but she is vehemently against surgical intervention.  After long discussion of the pathology in her back and the options, she would like to try gabapentin and we will refer her to talk with Dr. Ernestina Patches about ablation for facet arthritis.  Patient agreed with plan.  Follow-up as needed.  Follow-Up Instructions: No follow-ups on file.   Orders:  Orders Placed This Encounter  Procedures   XR Lumbar Spine 2-3 Views   No orders of the defined types were placed in this encounter.     Procedures: No procedures performed   Clinical Data: No additional findings.  Objective: Vital Signs: LMP 04/04/2012   Physical Exam:  Constitutional: Patient appears well-developed HEENT:  Head: Normocephalic Eyes:EOM are normal Neck: Normal range of motion Cardiovascular: Normal rate Pulmonary/chest: Effort normal Neurologic: Patient is alert Skin: Skin is warm Psychiatric: Patient has normal mood and affect  Ortho Exam: Ortho exam demonstrates positive straight leg raise at 30 degrees of elevation of bilateral legs.  5 -/5 strength of bilateral  hip flexion, quadricep, hamstring, dorsiflexion, plantarflexion, EHL.  No evidence of clonus.  Tenderness throughout the axial lumbar spine around the level of L4 and L5.  Pain worse with extension of the spine.  Specialty Comments:  No specialty comments available.  Imaging: No results found.   PMFS History: Patient Active Problem List   Diagnosis Date Noted   Preop examination 02/16/2021   Paresthesia 02/16/2021   Screening for heart disease 02/16/2021   Diabetes mellitus type 2 in obese ([place]) 02/16/2021   Elevated  LDL cholesterol level 07/31/2020   Obesity (BMI 30-39.9) 07/31/2020   Leiomyoma of uterus 04/29/2019   Thickened endometrium    Family history of heart disease 01/11/2019   Vitamin D deficiency 01/15/2018   Gastroesophageal reflux disease 01/03/2018   Essential hypertension 01/03/2018   Chronic bilateral low back pain without sciatica 09/20/2017   Endometrial polyp 2016   Past Medical History:  Diagnosis Date   Abnormal Pap smear of cervix    Endometrial polyp 2016   [facility]   GERD (gastroesophageal reflux disease)    Glaucoma    Hypertension    Leiomyoma of uterus 04/29/2019   Prediabetes    Thickened endometrium 2016   per [facility] records   Vitamin D deficiency 01/15/2018    Family History  Problem Relation Age of Onset   Hypertension Mother    Heart failure Mother    Hypertension Father    Hypertension Sister    Hypertension Brother     Past Surgical History:  Procedure Laterality Date   CESAREAN SECTION     DILATION AND CURETTAGE OF UTERUS  2016   Social History   Occupational History   Not on file  Tobacco Use   Smoking status: Never   Smokeless tobacco: Never  Substance and Sexual Activity   Alcohol use: No   Drug use: No   Sexual activity: Not Currently    Birth control/protection: Abstinence

## 2021-03-31 ENCOUNTER — Ambulatory Visit (INDEPENDENT_AMBULATORY_CARE_PROVIDER_SITE_OTHER): Payer: BC Managed Care – PPO | Admitting: Physical Medicine and Rehabilitation

## 2021-03-31 ENCOUNTER — Encounter: Payer: Self-pay | Admitting: Physical Medicine and Rehabilitation

## 2021-03-31 ENCOUNTER — Other Ambulatory Visit: Payer: Self-pay

## 2021-03-31 VITALS — BP 148/87 | HR 82

## 2021-03-31 DIAGNOSIS — G8929 Other chronic pain: Secondary | ICD-10-CM | POA: Diagnosis not present

## 2021-03-31 DIAGNOSIS — M47816 Spondylosis without myelopathy or radiculopathy, lumbar region: Secondary | ICD-10-CM

## 2021-03-31 DIAGNOSIS — M545 Low back pain, unspecified: Secondary | ICD-10-CM

## 2021-03-31 DIAGNOSIS — M5416 Radiculopathy, lumbar region: Secondary | ICD-10-CM

## 2021-03-31 NOTE — Progress Notes (Signed)
Pt state lower back pain that travels down both legs and foot. Pt state any movement makes the pain worse. Pt state she takes pain meds to help ease her pain.   Numeric Pain Rating Scale and Functional Assessment Average Pain 10 Pain Right Now 10 My pain is constant, sharp, burning, dull, stabbing, tingling, and aching Pain is worse with: walking, bending, sitting, standing, and some activites Pain improves with: medication   In the last MONTH (on 0-10 scale) has pain interfered with the following?  1. General activity like being  able to carry out your everyday physical activities such as walking, climbing stairs, carrying groceries, or moving a chair?  Rating(8)  2. Relation with others like being able to carry out your usual social activities and roles such as  activities at home, at work and in your community. Rating(9)  3. Enjoyment of life such that you have  been bothered by emotional problems such as feeling anxious, depressed or irritable?  Rating(10)

## 2021-04-01 ENCOUNTER — Encounter: Payer: Self-pay | Admitting: Physical Medicine and Rehabilitation

## 2021-04-01 NOTE — Progress Notes (Signed)
Amy [name] - 60 y.o. female MRN [id number]  Date of birth: 12/16/1960  Office Visit Note: Visit Date: 03/31/2021 PCP: Girtha Rm, PA-C Referred by: Meredith Pel, MD  Subjective: Chief Complaint  Patient presents with   Lower Back - Pain   Left Leg - Pain   Right Leg - Pain   Left Foot - Pain   Right Foot - Pain   HPI: Amy [name] is a 60 y.o. female who comes in today Per the request of Dr. Alphonzo Severance for evaluation of chronic, worsening and severe bilateral lower back pain with intermittent radiation to legs. Patient reports most severe excruciating pain is bilateral lower back. Patient's son present via telephone during out visit. Patient reports pain has been ongoing for several years. Patient has had multiple bilateral S1 transforaminal epidural steroid injections with minimal to no relief. Patient did have bilateral L5-S1 facet joint injections in 2019 which she reports helped significantly with her lower back pain. Patient reports bilateral lower is exacerbated by walking, standing and bending. She describes pain as a constant soreness, currently rates as 8 out of 10. Patient reports some relief of pain with home exercise regimen, rest and use of medications. Patient reports she did attend formal physical therapy in [place] and states minimal relief with these treatments. Patient states she has also had consultation with neurosurgeon in the past and does not wish to proceed with surgical intervention at this time. Patient's lumbar MRI from 2019 exhibits severe facet degeneration L5-S1 with marked subarticular foraminal stenosis bilaterally L5-S1. No high grade spinal canal stenosis noted. Patient states her severe lower back pain is making it difficult for her to complete daily tasks and work. Patient denies focal weakness, numbness and tingling. Patient denies recent trauma or falls.     Review of Systems  Musculoskeletal:  Positive for back pain.  Neurological:   Negative for tingling, sensory change, focal weakness and weakness.  All other systems reviewed and are negative. Otherwise per HPI.  Assessment & Plan: Visit Diagnoses:    ICD-10-CM   1. Spondylosis without myelopathy or radiculopathy, lumbar region  M47.816     2. Chronic bilateral low back pain without sciatica  M54.50    G89.29     3. Facet hypertrophy of lumbar region  M47.816     4. Lumbar radiculopathy  M54.16        Plan: Findings:  Chronic, worsening and severe bilateral lower back pain. Patient's most severe source of pain is bilateral lower back. She does have intermittent radiation to bilateral legs, however this is not really her main concern. Patient is somewhat of a poor historian and has a difficult time understanding her pain pattern and pathology of lumbar pain. Patient has had multiple lumbar epidural steroid injections in the past with minimal to no relief. She did good pain relief with bilateral L5-S1 facet joint injections in 2019. Patient's clinical presentation and exam are consistent with facet mediated pain. Her lumbar MRI from 2019 does exhibit severe facet degeneration at L5-S1. We believe the next step is to perform diagnostic and hopefully therapeutic bilateral L5-S1 facet joint/medial branch blocks under fluoroscopic guidance. If patient does well with diagnostic facet blocks we did discuss the possibility of longer sustained pain relief with radiofrequency ablation procedure. Patient given educational literature regarding radiofrequency ablation procedure to take home and review. I did spend approximately 45 minutes face to face with this patient today discussing lumbar pathology, treatment options and radiofrequency ablation procedure.  Patient encouraged to remain active and to continue home exercises as tolerated. No red flag symptoms noted upon exam today.    Meds & Orders: No orders of the defined types were placed in this encounter.  No orders of the defined  types were placed in this encounter.   Follow-up: Return for Bilateral L5-S1 facet joint/medial branch blocks.   Procedures: No procedures performed      Clinical History: MRI LUMBAR SPINE WITHOUT CONTRAST   TECHNIQUE: Multiplanar, multisequence MR imaging of the lumbar spine was performed. No intravenous contrast was administered.   COMPARISON:  None.   FINDINGS: Segmentation:  Normal   Alignment:  Normal   Vertebrae:  Normal bone marrow.  Negative for fracture or mass.   Conus medullaris and cauda equina: Conus extends to the L1-2 level. Conus and cauda equina appear normal.   Paraspinal and other soft tissues: Negative for paraspinous mass or fluid collection   Disc levels:   L1-2: Negative   L2-3: Negative   L3-4: Mild disc and facet degeneration without significant stenosis   L4-5: Mild disc and mild facet degeneration. Mild subarticular stenosis bilaterally   L5-S1: Severe facet degeneration bilaterally with facet joint effusions and diffuse facet hypertrophy. Mild disc degeneration. Severe subarticular and foraminal stenosis with bilateral L5 and S1 nerve root impingement.   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   Mild degenerative change L3-4 and L4-5.     Electronically Signed   By: Franchot Gallo M.D.   On: 10/15/2017 10:28   She reports that she has never smoked. She has never used smokeless tobacco.  Recent Labs    06/26/20 1142 01/18/21 1600  HGBA1C 6.5* 6.5*    Objective:  VS:  HT:     WT:    BMI:      BP: (!) 148/87   HR:82bpm   TEMP: ( )   RESP:  Physical Exam Vitals and nursing note reviewed.  HENT:     Head: Normocephalic and atraumatic.     Right Ear: External ear normal.     Left Ear: External ear normal.     Nose: Nose normal.     Mouth/Throat:     Mouth: Mucous membranes are moist.  Eyes:     Extraocular Movements: Extraocular movements intact.   Cardiovascular:     Rate and Rhythm: Normal rate.     Pulses: Normal pulses.  Pulmonary:     Effort: Pulmonary effort is normal.  Abdominal:     General: Abdomen is flat. There is no distension.  Musculoskeletal:        General: Tenderness present.     Cervical back: Normal range of motion.     Comments: Pt is slow to rise from seated position to standing. Concordant low back pain with facet loading, lumbar spine extension and rotation. Strong distal strength without clonus, no pain upon palpation of greater trochanters. Sensation intact bilaterally. Walks independently, gait steady.   Skin:    General: Skin is warm and dry.     Capillary Refill: Capillary refill takes less than 2 seconds.  Neurological:     General: No focal deficit present.     Mental Status: She is alert.  Psychiatric:        Mood and Affect: Mood normal.    Ortho Exam  Imaging: No results found.  Past Medical/Family/Surgical/Social History: Medications & Allergies reviewed per EMR, new medications updated. Patient Active Problem List   Diagnosis  Date Noted   Preop examination 02/16/2021   Paresthesia 02/16/2021   Screening for heart disease 02/16/2021   Diabetes mellitus type 2 in obese ([place]) 02/16/2021   Elevated LDL cholesterol level 07/31/2020   Obesity (BMI 30-39.9) 07/31/2020   Leiomyoma of uterus 04/29/2019   Thickened endometrium    Family history of heart disease 01/11/2019   Vitamin D deficiency 01/15/2018   Gastroesophageal reflux disease 01/03/2018   Essential hypertension 01/03/2018   Chronic bilateral low back pain without sciatica 09/20/2017   Endometrial polyp 2016   Past Medical History:  Diagnosis Date   Abnormal Pap smear of cervix    Endometrial polyp 2016   [facility]   GERD (gastroesophageal reflux disease)    Glaucoma    Hypertension    Leiomyoma of uterus 04/29/2019   Prediabetes    Thickened endometrium 2016   per [facility] records   Vitamin D  deficiency 01/15/2018   Family History  Problem Relation Age of Onset   Hypertension Mother    Heart failure Mother    Hypertension Father    Hypertension Sister    Hypertension Brother    Past Surgical History:  Procedure Laterality Date   CESAREAN SECTION     DILATION AND CURETTAGE OF UTERUS  2016   Social History   Occupational History   Not on file  Tobacco Use   Smoking status: Never   Smokeless tobacco: Never  Substance and Sexual Activity   Alcohol use: No   Drug use: No   Sexual activity: Not Currently    Birth control/protection: Abstinence

## 2021-04-12 ENCOUNTER — Telehealth: Payer: Self-pay

## 2021-04-12 NOTE — Telephone Encounter (Signed)
Dr. Delos Haring (Oncologist in [place]) called left VM on triage line. States he would like a CB from Dr. Marlou Sa. Would like to have a discussion regarding his mom. No other details were left.    CB [phone]

## 2021-04-12 NOTE — Telephone Encounter (Signed)
I called Dr Delos Haring advised that Dr Marlou Sa out of the office likely until Wednesday. Advised would have Dr Marlou Sa to call him when he returns.

## 2021-04-14 NOTE — Telephone Encounter (Signed)
I called [facility] will get records thx

## 2021-04-15 ENCOUNTER — Ambulatory Visit (INDEPENDENT_AMBULATORY_CARE_PROVIDER_SITE_OTHER): Payer: BC Managed Care – PPO | Admitting: Obstetrics & Gynecology

## 2021-04-15 ENCOUNTER — Other Ambulatory Visit (HOSPITAL_COMMUNITY)
Admission: RE | Admit: 2021-04-15 | Discharge: 2021-04-15 | Disposition: A | Discharge status: Home / Self Care | Payer: BC Managed Care – PPO | Source: Ambulatory Visit | Attending: Obstetrics & Gynecology | Admitting: Obstetrics & Gynecology

## 2021-04-15 ENCOUNTER — Other Ambulatory Visit: Payer: Self-pay

## 2021-04-15 ENCOUNTER — Ambulatory Visit: Payer: BC Managed Care – PPO | Admitting: Obstetrics & Gynecology

## 2021-04-15 ENCOUNTER — Encounter: Payer: Self-pay | Admitting: Obstetrics & Gynecology

## 2021-04-15 VITALS — BP 104/62 | HR 72 | Wt 196.0 lb

## 2021-04-15 DIAGNOSIS — Z780 Asymptomatic menopausal state: Secondary | ICD-10-CM | POA: Diagnosis not present

## 2021-04-15 DIAGNOSIS — Z6835 Body mass index (BMI) 35.0-35.9, adult: Secondary | ICD-10-CM

## 2021-04-15 DIAGNOSIS — Z01419 Encounter for gynecological examination (general) (routine) without abnormal findings: Secondary | ICD-10-CM | POA: Insufficient documentation

## 2021-04-15 DIAGNOSIS — N870 Mild cervical dysplasia: Secondary | ICD-10-CM | POA: Insufficient documentation

## 2021-04-15 NOTE — Progress Notes (Signed)
Amy [name] [birth year] [id number]   History:    61 y.o. [id number]  RP:  Established patient presenting for annual gyn exam   HPI: Postmenopausal, well on no HRT.  No PMB.  Pap LGSIL 07/2020.  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  No pelvic pain.  Abstinent.  Breasts normal.  Screen mammo 11/2020 Neg.  Colono 2017 normal per patient [employer]).  BMI 35.85.  Health labs with [facility].  Past medical history,surgical history, family history and social history were all reviewed and documented in the EPIC chart.  Gynecologic History Patient's last menstrual period was 04/04/2012.  Obstetric History OB History  Gravida Para Term Preterm AB Living  4       1 3   SAB IAB Ectopic Multiple Live Births  1       3    # Outcome Date GA Lbr Len/2nd Weight Sex Delivery Anes PTL Lv  4 Gravida           3 Gravida           2 Gravida           1 SAB              ROS: A ROS was performed and pertinent positives and negatives are included in the history.  GENERAL: No fevers or chills. HEENT: No change in vision, no earache, sore throat or sinus congestion. NECK: No pain or stiffness. CARDIOVASCULAR: No chest pain or pressure. No palpitations. PULMONARY: No shortness of breath, cough or wheeze. GASTROINTESTINAL: No abdominal pain, nausea, vomiting or diarrhea, melena or bright red blood per rectum. GENITOURINARY: No urinary frequency, urgency, hesitancy or dysuria. MUSCULOSKELETAL: No joint or muscle pain, no back pain, no recent trauma. DERMATOLOGIC: No rash, no itching, no lesions. ENDOCRINE: No polyuria, polydipsia, no heat or cold intolerance. No recent change in weight. HEMATOLOGICAL: No anemia or easy bruising or bleeding. NEUROLOGIC: No headache, seizures, numbness, tingling or weakness. PSYCHIATRIC: No depression, no loss of interest in normal activity or change in sleep pattern.     Exam:   BP 104/62    Pulse 72    Wt 196 lb (88.9 kg)    LMP 04/04/2012    SpO2 99%    BMI 35.85 kg/m   Body mass index is 35.85  kg/m.  General appearance : Well developed well nourished female. No acute distress HEENT: Eyes: no retinal hemorrhage or exudates,  Neck supple, trachea midline, no carotid bruits, no thyroidmegaly Lungs: Clear to auscultation, no rhonchi or wheezes, or rib retractions  Heart: Regular rate and rhythm, no murmurs or gallops Breast:Examined in sitting and supine position were symmetrical in appearance, no palpable masses or tenderness,  no skin retraction, no nipple inversion, no nipple discharge, no skin discoloration, no axillary or supraclavicular lymphadenopathy Abdomen: no palpable masses or tenderness, no rebound or guarding Extremities: no edema or skin discoloration or tenderness  Pelvic: Vulva: Normal             Vagina: No gross lesions or discharge  Cervix: No gross lesions or discharge.  Pap/HPV HR done.  Uterus  AV, normal size, shape and consistency, non-tender and mobile  Adnexa  Without masses or tenderness  Anus: Normal   Assessment/Plan:  61 y.o. female for annual exam   1. Encounter for routine gynecological examination with Papanicolaou smear of cervix Postmenopausal, well on no HRT.  No PMB.  Pap LGSIL 07/2020.  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  No pelvic pain.  Abstinent.  Breasts  normal.  Screen mammo 11/2020 Neg.  Colono 2017 normal per patient [facility]).  BMI 35.85.  Health labs with [facility]. - Cytology - PAP( [place])  2. Dysplasia of cervix, low grade (CIN 1) - Cytology - PAP( [place])  3. Postmenopause Postmenopausal, well on no HRT.  No PMB.   4. Class 2 severe obesity due to excess calories with serious comorbidity and body mass index (BMI) of 35.0 to 35.9 in adult [facility])  Lower calorie/carb diet.  Increase fitness activities.  Princess Bruins MD, 4:45 PM 04/15/2021

## 2021-04-20 LAB — CYTOLOGY - PAP
Comment: NEGATIVE
Diagnosis: NEGATIVE
Diagnosis: REACTIVE
High risk HPV: NEGATIVE

## 2021-04-27 ENCOUNTER — Encounter: Payer: Self-pay | Admitting: Physical Medicine and Rehabilitation

## 2021-04-27 ENCOUNTER — Ambulatory Visit: Payer: Self-pay

## 2021-04-27 ENCOUNTER — Ambulatory Visit (INDEPENDENT_AMBULATORY_CARE_PROVIDER_SITE_OTHER): Payer: BC Managed Care – PPO | Admitting: Physical Medicine and Rehabilitation

## 2021-04-27 ENCOUNTER — Other Ambulatory Visit: Payer: Self-pay

## 2021-04-27 DIAGNOSIS — M47816 Spondylosis without myelopathy or radiculopathy, lumbar region: Secondary | ICD-10-CM

## 2021-04-27 MED ORDER — METHYLPREDNISOLONE ACETATE 80 MG/ML IJ SUSP: 80.0000 mg | Freq: Once | INTRAMUSCULAR | Status: DC

## 2021-04-27 NOTE — Progress Notes (Signed)
Pt state lower back pain that travels down both legs. Pt state any movement makes the pain worse. Pt state she takes pain meds to help ease her pain. Pt state last inj she had 07/06/20 didn't help, but the one she had 09/26/19 & 11/20/17 helped the most.  Numeric Pain Rating Scale and Functional Assessment Average Pain 10   In the last MONTH (on 0-10 scale) has pain interfered with the following?  1. General activity like being  able to carry out your everyday physical activities such as walking, climbing stairs, carrying groceries, or moving a chair?  Rating(10)   +Driver, -BT, -Dye Allergies.

## 2021-04-27 NOTE — Patient Instructions (Signed)

## 2021-05-11 NOTE — Progress Notes (Signed)
Amy [name] - 61 y.o. female MRN [id number]  Date of birth: 11-30-1960  Office Visit Note: Visit Date: 04/27/2021 PCP: Girtha Rm, PA-C Referred by: Girtha Rm, PA-C  Subjective: Chief Complaint  Patient presents with   Lower Back - Pain   Right Leg - Pain   Left Leg - Pain   HPI:  Amy [name] is a 61 y.o. female who comes in today for planned repeat Bilateral L5-S1 Lumbar facet/medial branch block with fluoroscopic guidance.  The patient has failed conservative care including home exercise, medications, time and activity modification.  This injection will be diagnostic and hopefully therapeutic.  Please see requesting physician notes for further details and justification.  Exam shows concordant low back pain with facet joint loading and extension. Patient received more than 80% pain relief from prior injection. This would be the second block in a diagnostic double block paradigm.     Referring:Megan Jimmye Norman, FNP   ROS Otherwise per HPI.  Assessment & Plan: Visit Diagnoses:    ICD-10-CM   1. Spondylosis without myelopathy or radiculopathy, lumbar region  M47.816 XR C-ARM NO REPORT    Facet Injection    DISCONTINUED: methylPREDNISolone acetate (DEPO-MEDROL) injection 80 mg      Plan: No additional findings.   Meds & Orders:  Meds ordered this encounter  Medications   DISCONTD: methylPREDNISolone acetate (DEPO-MEDROL) injection 80 mg    Orders Placed This Encounter  Procedures   Facet Injection   XR C-ARM NO REPORT    Follow-up: Return for Review Pain Diary.   Procedures: No procedures performed  Lumbar Diagnostic Facet Joint Nerve Block with Fluoroscopic Guidance   [name]      Date of Birth: Jul 01, 1960 MRN: [id number] PCP: Girtha Rm, PA-C      Visit Date: 04/27/2021   Universal Protocol:    Date/Time: 02/07/237:55 AM  Consent Given By: the patient  Position: PRONE  Additional Comments: Vital signs were monitored before  and after the procedure. Patient was prepped and draped in the usual sterile fashion. The correct patient, procedure, and site was verified.   Injection Procedure Details:   Procedure diagnoses:  1. Spondylosis without myelopathy or radiculopathy, lumbar region      Meds Administered:  Meds ordered this encounter  Medications   methylPREDNISolone acetate (DEPO-MEDROL) injection 80 mg     Laterality: Bilateral  Location/Site: L5-S1, L4 medial branch and L5 dorsal ramus  Needle: 5.0 in., 25 ga.  Short bevel or Quincke spinal needle  Needle Placement: Oblique pedical  Findings:   -Comments: There was excellent flow of contrast along the articular pillars without intravascular flow.  Procedure Details: The fluoroscope beam is vertically oriented in AP and then obliqued 15 to 20 degrees to the ipsilateral side of the desired nerve to achieve the Scotty dog appearance.  The skin over the target area of the junction of the superior articulating process and the transverse process (sacral ala if blocking the L5 dorsal rami) was locally anesthetized with a 1 ml volume of 1% Lidocaine without Epinephrine.  The spinal needle was inserted and advanced in a trajectory view down to the target.   After contact with periosteum and negative aspirate for blood and CSF, correct placement without intravascular or epidural spread was confirmed by injecting 0.5 ml. of Isovue-250.  A spot radiograph was obtained of this image.    Next, a 0.5 ml. volume of the injectate described above was injected. The needle was then redirected to  the other facet joint nerves mentioned above if needed.  Prior to the procedure, the patient was given a Pain Diary which was completed for baseline measurements.  After the procedure, the patient rated their pain every 30 minutes and will continue rating at this frequency for a total of 5 hours.  The patient has been asked to complete the Diary and return to [place] by mail, fax  or hand delivered as soon as possible.   Additional Comments:  No complications occurred Dressing: 2 x 2 sterile gauze and Band-Aid    Post-procedure details: Patient was observed during the procedure. Post-procedure instructions were reviewed.  Patient left the clinic in stable condition.    Clinical History: MRI LUMBAR SPINE WITHOUT CONTRAST   TECHNIQUE: Multiplanar, multisequence MR imaging of the lumbar spine was performed. No intravenous contrast was administered.   COMPARISON:  None.   FINDINGS: Segmentation:  Normal   Alignment:  Normal   Vertebrae:  Normal bone marrow.  Negative for fracture or mass.   Conus medullaris and cauda equina: Conus extends to the L1-2 level. Conus and cauda equina appear normal.   Paraspinal and other soft tissues: Negative for paraspinous mass or fluid collection   Disc levels:   L1-2: Negative   L2-3: Negative   L3-4: Mild disc and facet degeneration without significant stenosis   L4-5: Mild disc and mild facet degeneration. Mild subarticular stenosis bilaterally   L5-S1: Severe facet degeneration bilaterally with facet joint effusions and diffuse facet hypertrophy. Mild disc degeneration. Severe subarticular and foraminal stenosis with bilateral L5 and S1 nerve root impingement.   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   Mild degenerative change L3-4 and L4-5.     Electronically Signed   By: Franchot Gallo M.D.   On: 10/15/2017 10:28     Objective:  VS:  HT:     WT:    BMI:      BP:    HR: bpm   TEMP: ( )   RESP:  Physical Exam Vitals and nursing note reviewed.  Constitutional:      General: She is not in acute distress.    Appearance: Normal appearance. She is not ill-appearing.  HENT:     Head: Normocephalic and atraumatic.     Right Ear: External ear normal.     Left Ear: External ear normal.  Eyes:     Extraocular Movements:  Extraocular movements intact.  Cardiovascular:     Rate and Rhythm: Normal rate.     Pulses: Normal pulses.  Pulmonary:     Effort: Pulmonary effort is normal. No respiratory distress.  Abdominal:     General: There is no distension.     Palpations: Abdomen is soft.  Musculoskeletal:        General: Tenderness present.     Cervical back: Neck supple.     Right lower leg: No edema.     Left lower leg: No edema.     Comments: Patient has good distal strength with no pain over the greater trochanters.  No clonus or focal weakness. Patient somewhat slow to rise from a seated position to full extension.  There is concordant low back pain with facet loading and lumbar spine extension rotation.  There are no definitive trigger points but the patient is somewhat tender across the lower back and PSIS.  There is no pain with hip rotation.   Skin:    Findings: No erythema, lesion or rash.  Neurological:     General: No focal deficit present.     Mental Status: She is alert and oriented to person, place, and time.     Sensory: No sensory deficit.     Motor: No weakness or abnormal muscle tone.     Coordination: Coordination normal.  Psychiatric:        Mood and Affect: Mood normal.        Behavior: Behavior normal.     Imaging: No results found.

## 2021-05-11 NOTE — Procedures (Signed)
Lumbar Diagnostic Facet Joint Nerve Block with Fluoroscopic Guidance   [name]      Date of Birth: 05/05/1960 MRN: [id number] PCP: Girtha Rm, PA-C      Visit Date: 04/27/2021   Universal Protocol:    Date/Time: 02/07/237:55 AM  Consent Given By: the patient  Position: PRONE  Additional Comments: Vital signs were monitored before and after the procedure. Patient was prepped and draped in the usual sterile fashion. The correct patient, procedure, and site was verified.   Injection Procedure Details:   Procedure diagnoses:  1. Spondylosis without myelopathy or radiculopathy, lumbar region      Meds Administered:  Meds ordered this encounter  Medications   methylPREDNISolone acetate (DEPO-MEDROL) injection 80 mg     Laterality: Bilateral  Location/Site: L5-S1, L4 medial branch and L5 dorsal ramus  Needle: 5.0 in., 25 ga.  Short bevel or Quincke spinal needle  Needle Placement: Oblique pedical  Findings:   -Comments: There was excellent flow of contrast along the articular pillars without intravascular flow.  Procedure Details: The fluoroscope beam is vertically oriented in AP and then obliqued 15 to 20 degrees to the ipsilateral side of the desired nerve to achieve the Scotty dog appearance.  The skin over the target area of the junction of the superior articulating process and the transverse process (sacral ala if blocking the L5 dorsal rami) was locally anesthetized with a 1 ml volume of 1% Lidocaine without Epinephrine.  The spinal needle was inserted and advanced in a trajectory view down to the target.   After contact with periosteum and negative aspirate for blood and CSF, correct placement without intravascular or epidural spread was confirmed by injecting 0.5 ml. of Isovue-250.  A spot radiograph was obtained of this image.    Next, a 0.5 ml. volume of the injectate described above was injected. The needle was then redirected to the other  facet joint nerves mentioned above if needed.  Prior to the procedure, the patient was given a Pain Diary which was completed for baseline measurements.  After the procedure, the patient rated their pain every 30 minutes and will continue rating at this frequency for a total of 5 hours.  The patient has been asked to complete the Diary and return to [place] by mail, fax or hand delivered as soon as possible.   Additional Comments:  No complications occurred Dressing: 2 x 2 sterile gauze and Band-Aid    Post-procedure details: Patient was observed during the procedure. Post-procedure instructions were reviewed.  Patient left the clinic in stable condition.

## 2021-06-14 ENCOUNTER — Other Ambulatory Visit: Payer: Self-pay | Admitting: Family Medicine

## 2021-06-14 DIAGNOSIS — R7309 Other abnormal glucose: Secondary | ICD-10-CM

## 2021-10-01 ENCOUNTER — Encounter: Payer: Self-pay | Admitting: Internal Medicine

## 2021-10-11 ENCOUNTER — Other Ambulatory Visit: Payer: Self-pay | Admitting: Medical

## 2021-10-11 ENCOUNTER — Telehealth: Payer: Self-pay | Admitting: Medical

## 2021-10-11 DIAGNOSIS — E7800 Pure hypercholesterolemia, unspecified: Secondary | ICD-10-CM

## 2021-10-11 DIAGNOSIS — I10 Essential (primary) hypertension: Secondary | ICD-10-CM

## 2021-10-11 DIAGNOSIS — G8929 Other chronic pain: Secondary | ICD-10-CM

## 2021-10-11 DIAGNOSIS — R7309 Other abnormal glucose: Secondary | ICD-10-CM

## 2021-10-11 MED ORDER — DICLOFENAC SODIUM 75 MG PO TBEC: 75.0000 mg | DELAYED_RELEASE_TABLET | Freq: Two times a day (BID) | ORAL | 0 refills | Status: DC

## 2021-10-11 MED ORDER — ONETOUCH DELICA PLUS LANCET33G MISC: 1.0000 | Freq: Every day | 0 refills | Status: DC

## 2021-10-11 MED ORDER — ATORVASTATIN CALCIUM 10 MG PO TABS: 10.0000 mg | ORAL_TABLET | Freq: Every day | ORAL | 0 refills | Status: DC

## 2021-10-11 MED ORDER — ONETOUCH VERIO VI STRP: 1.0000 | ORAL_STRIP | 0 refills | Status: DC | PRN

## 2021-10-11 MED ORDER — METFORMIN HCL 500 MG PO TABS: ORAL_TABLET | ORAL | 0 refills | Status: DC

## 2021-10-11 MED ORDER — AMLODIPINE BESYLATE 10 MG PO TABS: 10.0000 mg | ORAL_TABLET | Freq: Every day | ORAL | 0 refills | Status: DC

## 2021-10-11 MED ORDER — HYDROCHLOROTHIAZIDE 12.5 MG PO TABS: ORAL_TABLET | ORAL | 0 refills | Status: DC

## 2021-10-11 NOTE — Telephone Encounter (Signed)
Forgot to provide which pharmacy she is requesting which is [facility] on [address].

## 2021-10-11 NOTE — Telephone Encounter (Signed)
Pt needs a refill on atorvastatin, diclofenae, amlodipine, hydrochlorothiazide, metformin, test strips and lancet.

## 2021-10-12 ENCOUNTER — Other Ambulatory Visit: Payer: Self-pay

## 2021-10-12 DIAGNOSIS — R7309 Other abnormal glucose: Secondary | ICD-10-CM

## 2021-10-12 DIAGNOSIS — E7800 Pure hypercholesterolemia, unspecified: Secondary | ICD-10-CM

## 2021-10-12 DIAGNOSIS — I10 Essential (primary) hypertension: Secondary | ICD-10-CM

## 2021-10-12 DIAGNOSIS — G8929 Other chronic pain: Secondary | ICD-10-CM

## 2021-10-12 MED ORDER — DICLOFENAC SODIUM 75 MG PO TBEC: 75.0000 mg | DELAYED_RELEASE_TABLET | Freq: Two times a day (BID) | ORAL | 0 refills | Status: DC

## 2021-10-12 MED ORDER — HYDROCHLOROTHIAZIDE 12.5 MG PO TABS: ORAL_TABLET | ORAL | 0 refills | Status: DC

## 2021-10-12 MED ORDER — ONETOUCH DELICA PLUS LANCET33G MISC: 1.0000 | Freq: Every day | 0 refills | Status: DC

## 2021-10-12 MED ORDER — ATORVASTATIN CALCIUM 10 MG PO TABS: 10.0000 mg | ORAL_TABLET | Freq: Every day | ORAL | 0 refills | Status: DC

## 2021-10-12 MED ORDER — METFORMIN HCL 500 MG PO TABS: ORAL_TABLET | ORAL | 0 refills | Status: DC

## 2021-10-12 MED ORDER — AMLODIPINE BESYLATE 10 MG PO TABS: 10.0000 mg | ORAL_TABLET | Freq: Every day | ORAL | 0 refills | Status: DC

## 2021-10-12 MED ORDER — ONETOUCH VERIO VI STRP: 1.0000 | ORAL_STRIP | 0 refills | Status: DC | PRN

## 2021-10-12 NOTE — Telephone Encounter (Signed)
Pt called and needs these rx changed to this pharmacy they was sent to the [facility] they need to be sent to the [facility] on [address]  atorvastatin, diclofenae, amlodipine, hydrochlorothiazide, metformin, test strips and lancet.

## 2021-10-25 ENCOUNTER — Ambulatory Visit: Payer: BC Managed Care – PPO | Admitting: Obstetrics & Gynecology

## 2021-11-04 ENCOUNTER — Other Ambulatory Visit: Payer: Self-pay | Admitting: Medical

## 2021-11-04 DIAGNOSIS — E7800 Pure hypercholesterolemia, unspecified: Secondary | ICD-10-CM

## 2021-11-04 DIAGNOSIS — R7309 Other abnormal glucose: Secondary | ICD-10-CM

## 2021-11-04 DIAGNOSIS — I10 Essential (primary) hypertension: Secondary | ICD-10-CM

## 2021-11-18 ENCOUNTER — Encounter: Payer: Self-pay | Admitting: Obstetrics & Gynecology

## 2021-11-18 ENCOUNTER — Ambulatory Visit (INDEPENDENT_AMBULATORY_CARE_PROVIDER_SITE_OTHER): Payer: 59 | Admitting: Obstetrics & Gynecology

## 2021-11-18 VITALS — BP 154/86 | HR 90 | Resp 12 | Ht 62.0 in | Wt 199.0 lb

## 2021-11-18 DIAGNOSIS — B3731 Acute candidiasis of vulva and vagina: Secondary | ICD-10-CM

## 2021-11-18 DIAGNOSIS — N898 Other specified noninflammatory disorders of vagina: Secondary | ICD-10-CM | POA: Diagnosis not present

## 2021-11-18 LAB — WET PREP FOR TRICH, YEAST, CLUE

## 2021-11-18 MED ORDER — FLUCONAZOLE 150 MG PO TABS: 150.0000 mg | ORAL_TABLET | ORAL | 1 refills | Status: AC

## 2021-11-18 NOTE — Progress Notes (Signed)
    Amy [name] 05-Jan-1961 [id number]        61 y.o.  [id number]   RP: Vaginal itching x 1 week  HPI: Vaginal itching with discharge x 1 week. No vaginal bleeding.  No pelvic pain.  Urine/BMs normal.  No fever.   OB History  Gravida Para Term Preterm AB Living  '4       1 3  '$ SAB IAB Ectopic Multiple Live Births  1       3    # Outcome Date GA Lbr Len/2nd Weight Sex Delivery Anes PTL Lv  4 Gravida           3 Gravida           2 Gravida           1 SAB             Past medical history,surgical history, problem list, medications, allergies, family history and social history were all reviewed and documented in the EPIC chart.   Directed ROS with pertinent positives and negatives documented in the history of present illness/assessment and plan.  Exam:  Vitals:   11/18/21 1344  BP: (!) 154/86  Pulse: 90  Resp: 12  Weight: 199 lb (90.3 kg)  Height: '5\' 2"'$  (1.575 m)   General appearance:  Normal  Gynecologic exam: Vulva normal.  Speculum:  Cervix/Vagina normal.  Increased vaginal discharge.  Wet prep done.  Wet prep: Yeasts present   Assessment/Plan:  61 y.o. [id number]   1. Vaginal itching Yeast vaginitis confirmed by wet prep. - WET PREP FOR TRICH, YEAST, CLUE - C. trachomatis/N. gonorrhoeae RNA  2. Yeast vaginitis Will treat with Fluconazole.  No CI.  Usage reviewed and prescription sent to pharmacy.  Other orders - fluconazole (DIFLUCAN) 150 MG tablet; Take 1 tablet (150 mg total) by mouth every other day for 3 days.   Princess Bruins MD, 2:09 PM 11/18/2021

## 2021-11-19 LAB — C. TRACHOMATIS/N. GONORRHOEAE RNA
C. trachomatis RNA, TMA: NOT DETECTED
N. gonorrhoeae RNA, TMA: NOT DETECTED

## 2021-11-30 ENCOUNTER — Encounter: Payer: Self-pay | Admitting: Obstetrics & Gynecology

## 2021-12-08 ENCOUNTER — Encounter: Payer: Self-pay | Admitting: Internal Medicine

## 2022-01-05 ENCOUNTER — Encounter: Payer: Self-pay | Admitting: Medical

## 2022-01-05 ENCOUNTER — Ambulatory Visit (INDEPENDENT_AMBULATORY_CARE_PROVIDER_SITE_OTHER): Payer: 59 | Admitting: Medical

## 2022-01-05 VITALS — BP 110/70 | HR 70 | Ht 62.5 in | Wt 193.8 lb

## 2022-01-05 DIAGNOSIS — Z8249 Family history of ischemic heart disease and other diseases of the circulatory system: Secondary | ICD-10-CM | POA: Diagnosis not present

## 2022-01-05 DIAGNOSIS — K219 Gastro-esophageal reflux disease without esophagitis: Secondary | ICD-10-CM | POA: Diagnosis not present

## 2022-01-05 DIAGNOSIS — Z7185 Encounter for immunization safety counseling: Secondary | ICD-10-CM | POA: Diagnosis not present

## 2022-01-05 DIAGNOSIS — E669 Obesity, unspecified: Secondary | ICD-10-CM

## 2022-01-05 DIAGNOSIS — E7800 Pure hypercholesterolemia, unspecified: Secondary | ICD-10-CM

## 2022-01-05 DIAGNOSIS — R7303 Prediabetes: Secondary | ICD-10-CM | POA: Diagnosis not present

## 2022-01-05 DIAGNOSIS — Z0000 Encounter for general adult medical examination without abnormal findings: Secondary | ICD-10-CM

## 2022-01-05 DIAGNOSIS — E559 Vitamin D deficiency, unspecified: Secondary | ICD-10-CM

## 2022-01-05 DIAGNOSIS — I10 Essential (primary) hypertension: Secondary | ICD-10-CM

## 2022-01-05 DIAGNOSIS — Z113 Encounter for screening for infections with a predominantly sexual mode of transmission: Secondary | ICD-10-CM | POA: Diagnosis not present

## 2022-01-05 DIAGNOSIS — R7309 Other abnormal glucose: Secondary | ICD-10-CM | POA: Diagnosis not present

## 2022-01-05 DIAGNOSIS — Z2821 Immunization not carried out because of patient refusal: Secondary | ICD-10-CM

## 2022-01-05 DIAGNOSIS — R69 Illness, unspecified: Secondary | ICD-10-CM | POA: Diagnosis not present

## 2022-01-05 MED ORDER — ATORVASTATIN CALCIUM 10 MG PO TABS: 10.0000 mg | ORAL_TABLET | Freq: Every day | ORAL | 3 refills | Status: DC

## 2022-01-05 MED ORDER — METFORMIN HCL 500 MG PO TABS: 500.0000 mg | ORAL_TABLET | Freq: Every day | ORAL | 3 refills | Status: DC

## 2022-01-05 MED ORDER — HYDROCHLOROTHIAZIDE 12.5 MG PO TABS: ORAL_TABLET | ORAL | 3 refills | Status: DC

## 2022-01-05 MED ORDER — ONETOUCH VERIO VI STRP: 1.0000 | ORAL_STRIP | 4 refills | Status: DC | PRN

## 2022-01-05 MED ORDER — AMLODIPINE BESYLATE 10 MG PO TABS: 10.0000 mg | ORAL_TABLET | Freq: Every day | ORAL | 3 refills | Status: DC

## 2022-01-05 NOTE — Progress Notes (Signed)
Subjective:   HPI  Amy [name] is a 61 y.o. female who presents for Chief Complaint  Patient presents with   fasitng cpe     Fasting cpe, no concerns, no flu shot, has obgyn     Patient Care Team: Marcellina Millin as PCP - General (Physician Assistant) Donato Heinz, MD as PCP - Cardiology (Cardiology) Princess Bruins, MD as Consulting Physician (Obstetrics and Gynecology) Sees dentist Sees eye doctor  Concerns: Here for well visit.  Doing ok except occasional headache.    Not exercising a lot.   Compliant with medicaiton.  No other new c/o  Reviewed their medical, surgical, family, social, medication, and allergy history and updated chart as appropriate.  Past Medical History:  Diagnosis Date   Abnormal Pap smear of cervix    Endometrial polyp 2016   [facility]   GERD (gastroesophageal reflux disease)    Glaucoma    Hyperlipidemia    Hypertension    Leiomyoma of uterus 04/29/2019   Prediabetes    Thickened endometrium 2016   per [facility] records   Vitamin D deficiency 01/15/2018    Family History  Problem Relation Age of Onset   Hypertension Mother    Heart failure Mother    Hypertension Father    Hypertension Sister    Hypertension Brother      Current Outpatient Medications:    Blood Glucose Monitoring Suppl ([place]) w/Device KIT, USE TO TEST DAILY., Disp: , Rfl:    diclofenac (VOLTAREN) 75 MG EC tablet, Take 1 tablet (75 mg total) by mouth 2 (two) times daily., Disp: 30 tablet, Rfl: 0   dorzolamide-timolol (COSOPT) 22.3-6.8 MG/ML ophthalmic solution, 1 drop 2 (two) times daily., Disp: , Rfl:    gabapentin (NEURONTIN) 100 MG capsule, Take 1 capsule (100 mg total) by mouth 2 (two) times daily., Disp: 60 capsule, Rfl: 0   Lancets (ONETOUCH DELICA PLUS [id number]) MISC, Inject 1 each into the skin daily., Disp: 100 each, Rfl: 0   LUMIGAN 0.01 % SOLN, , Disp: , Rfl:    Multiple Vitamin (MULTIVITAMIN)  tablet, Take 1 tablet by mouth daily., Disp: , Rfl:    amLODipine (NORVASC) 10 MG tablet, Take 1 tablet (10 mg total) by mouth daily., Disp: 90 tablet, Rfl: 3   atorvastatin (LIPITOR) 10 MG tablet, Take 1 tablet (10 mg total) by mouth daily., Disp: 90 tablet, Rfl: 3   hydrochlorothiazide (HYDRODIURIL) 12.5 MG tablet, TAKE 1 TABLET (12.5 MG) BY MOUTH DAILY, Disp: 90 tablet, Rfl: 3   metFORMIN (GLUCOPHAGE) 500 MG tablet, Take 1 tablet (500 mg total) by mouth daily with breakfast., Disp: 90 tablet, Rfl: 3   ONETOUCH VERIO test strip, 1 each by Other route as needed for other., Disp: 100 each, Rfl: 4  No Known Allergies    Review of Systems Constitutional: -fever, -chills, -sweats, -unexpected weight change, -decreased appetite, -fatigue Allergy: -sneezing, -itching, -congestion Dermatology: -changing moles, --rash, -lumps ENT: -runny nose, -ear pain, -sore throat, -hoarseness, -sinus pain, -teeth pain, - ringing in ears, -hearing loss, -nosebleeds Cardiology: -chest pain, -palpitations, -swelling, -difficulty breathing when lying flat, -waking up short of breath Respiratory: -cough, -shortness of breath, -difficulty breathing with exercise or exertion, -wheezing, -coughing up blood Gastroenterology: -abdominal pain, -nausea, -vomiting, -diarrhea, -constipation, -blood in stool, -changes in bowel movement, -difficulty swallowing or eating Hematology: -bleeding, -bruising  Musculoskeletal: -joint aches, -muscle aches, -joint swelling, -back pain, -neck pain, -cramping, -changes in gait Ophthalmology: denies vision changes, eye redness,  itching, discharge Urology: -burning with urination, -difficulty urinating, -blood in urine, -urinary frequency, -urgency, -incontinence Neurology: +headache, -weakness, -tingling, -numbness, -memory loss, -falls, -dizziness Psychology: -depressed mood, -agitation, -sleep problems Breast/gyn: -breast tendnerss, -discharge, -lumps, -vaginal discharge,- irregular  periods, -heavy periods      01/05/2022    1:32 PM 02/16/2021    2:48 PM 07/31/2020   11:34 AM 09/20/2017    3:29 PM  Depression screen PHQ 2/9  Decreased Interest 0 0 0 0  Down, Depressed, Hopeless 0 0 0 0  PHQ - 2 Score 0 0 0 0       Objective:  BP 110/70   Pulse 70   Ht 5' 2.5" (1.588 m)   Wt 193 lb 12.8 oz (87.9 kg)   LMP 04/04/2012   BMI 34.88 kg/m   General appearance: alert, no distress, WD/WN, African American female Skin: unremarkable HEENT: normocephalic, conjunctiva/corneas normal, sclerae anicteric, PERRLA, EOMi, nares patent, no discharge or erythema, pharynx normal Oral cavity: MMM, tongue normal, teeth normal Neck: supple, no lymphadenopathy, no thyromegaly, no masses, normal ROM, no bruits Chest: non tender, normal shape and expansion Heart: RRR, normal S1, S2, no murmurs Lungs: CTA bilaterally, no wheezes, rhonchi, or rales Abdomen: +bs, soft, non tender, non distended, no masses, no hepatomegaly, no splenomegaly, no bruits Back: non tender, normal ROM, no scoliosis Musculoskeletal: upper extremities non tender, no obvious deformity, normal ROM throughout, lower extremities non tender, no obvious deformity, normal ROM throughout Extremities: no edema, no cyanosis, no clubbing Pulses: 2+ symmetric, upper and lower extremities, normal cap refill Neurological: alert, oriented x 3, CN2-12 intact, strength normal upper extremities and lower extremities, sensation normal throughout, DTRs 2+ throughout, no cerebellar signs, gait normal Psychiatric: normal affect, behavior normal, pleasant  Breast/gyn/rectal - deferred to gynecology      Assessment and Plan :   Encounter Diagnoses  Name Primary?   Encounter for health maintenance examination in adult Yes   Prediabetes    Gastroesophageal reflux disease, unspecified whether esophagitis present    Essential hypertension    Elevated LDL cholesterol level    Family history of heart disease    Obesity (BMI  30-39.9)    Vitamin D deficiency    Screen for STD (sexually transmitted disease)    Vaccine counseling    Influenza vaccination declined    Elevated hemoglobin A1c      This visit was a preventative care visit, also known as wellness visit or routine physical.   Topics typically include healthy lifestyle, diet, exercise, preventative care, vaccinations, sick and well care, proper use of emergency dept and after hours care, as well as other concerns.     Recommendations: Continue to return yearly for your annual wellness and preventative care visits.  This gives [place] a chance to discuss healthy lifestyle, exercise, vaccinations, review your chart record, and perform screenings where appropriate.  I recommend you see your eye doctor yearly for routine vision care.  I recommend you see your dentist yearly for routine dental care including hygiene visits twice yearly.  See your gynecologist yearly for routine gynecological care.   Vaccination recommendations were reviewed Immunization History  Administered Date(s) Administered   PFIZER(Purple Top)SARS-COV-2 Vaccination 06/28/2019, 07/23/2019, 06/02/2020   Tdap 04/25/2017   I recommend shingles, flu and pneumococcal vaccines.  You decline those today   Screening for cancer: Colon cancer screening: Due next year 2024  Breast cancer screening: You should perform a self breast exam monthly.   We reviewed recommendations for regular mammograms  and breast cancer screening.  Cervical cancer screening: We reviewed recommendations for pap smear screening.   Skin cancer screening: Check your skin regularly for new changes, growing lesions, or other lesions of concern Come in for evaluation if you have skin lesions of concern.  Lung cancer screening: If you have a greater than 20 pack year history of tobacco use, then you may qualify for lung cancer screening with a chest CT scan.   Please call your insurance company to inquire about  coverage for this test.  We currently don't have screenings for other cancers besides breast, cervical, colon, and lung cancers.  If you have a strong family history of cancer or have other cancer screening concerns, please let me know.    Bone health: Get at least 150 minutes of aerobic exercise weekly Get weight bearing exercise at least once weekly Bone density test:  A bone density test is an imaging test that uses a type of X-ray to measure the amount of calcium and other minerals in your bones. The test may be used to diagnose or screen you for a condition that causes weak or thin bones (osteoporosis), predict your risk for a broken bone (fracture), or determine how well your osteoporosis treatment is working. The bone density test is recommended for females 61 and older, or females or males <46 if certain risk factors such as thyroid disease, long term use of steroids such as for asthma or rheumatological issues, vitamin D deficiency, estrogen deficiency, family history of osteoporosis, self or family history of fragility fracture in first degree relative.    Heart health: Get at least 150 minutes of aerobic exercise weekly Limit alcohol It is important to maintain a healthy blood pressure and healthy cholesterol numbers  Heart disease screening: Screening for heart disease includes screening for blood pressure, fasting lipids, glucose/diabetes screening, BMI height to weight ratio, reviewed of smoking status, physical activity, and diet.    Goals include blood pressure 120/80 or less, maintaining a healthy lipid/cholesterol profile, preventing diabetes or keeping diabetes numbers under good control, not smoking or using tobacco products, exercising most days per week or at least 150 minutes per week of exercise, and eating healthy variety of fruits and vegetables, healthy oils, and avoiding unhealthy food choices like fried food, fast food, high sugar and high cholesterol foods.     Other tests may possibly include EKG test, CT coronary calcium score, echocardiogram, exercise treadmill stress test.    Medical care options: I recommend you continue to seek care here first for routine care.  We try really hard to have available appointments Monday through Friday daytime hours for sick visits, acute visits, and physicals.  Urgent care should be used for after hours and weekends for significant issues that cannot wait till the next day.  The emergency department should be used for significant potentially life-threatening emergencies.  The emergency department is expensive, can often have long wait times for less significant concerns, so try to utilize primary care, urgent care, or telemedicine when possible to avoid unnecessary trips to the emergency department.  Virtual visits and telemedicine have been introduced since the pandemic started in 2020, and can be convenient ways to receive medical care.  We offer virtual appointments as well to assist you in a variety of options to seek medical care.   Advanced Directives: I recommend you consider completing a [place] and Living Will.   These documents respect your wishes and help alleviate burdens on  your loved ones if you were to become terminally ill or be in a position to need those documents enforced.    You can complete Advanced Directives yourself, have them notarized, then have copies made for our office, for you and for anybody you feel should have them in safe keeping.  Or, you can have an attorney prepare these documents.   If you haven't updated your Last Will and Testament in a while, it may be worthwhile having an attorney prepare these documents together and save on some costs.       Separate significant issues discussed: Hypertension-continue hydrochlorothiazide 12.5 mg daily, amlodipine 10 mg daily  Hyperlipidemia-continue Lipitor 10 mg daily  preDiabetes-continue metformin  Work on  getting exercise regularly  Work on losing weight through diet and exercise  [place] was seen today for fasitng cpe .  Diagnoses and all orders for this visit:  Encounter for health maintenance examination in adult -     Comprehensive metabolic panel -     CBC -     Lipid panel -     Hemoglobin A1c -     Cancel: POCT Urinalysis DIP (Proadvantage Device) -     VITAMIN D 25 Hydroxy (Vit-D Deficiency, Fractures) -     HIV Antibody (routine testing w rflx) -     RPR -     Hepatitis C antibody -     Hepatitis B surface antigen  Prediabetes -     Hemoglobin A1c  Gastroesophageal reflux disease, unspecified whether esophagitis present  Essential hypertension -     amLODipine (NORVASC) 10 MG tablet; Take 1 tablet (10 mg total) by mouth daily.  Elevated LDL cholesterol level -     Lipid panel -     atorvastatin (LIPITOR) 10 MG tablet; Take 1 tablet (10 mg total) by mouth daily.  Family history of heart disease  Obesity (BMI 30-39.9)  Vitamin D deficiency -     VITAMIN D 25 Hydroxy (Vit-D Deficiency, Fractures)  Screen for STD (sexually transmitted disease) -     HIV Antibody (routine testing w rflx) -     RPR -     Hepatitis C antibody -     Hepatitis B surface antigen  Vaccine counseling  Influenza vaccination declined  Elevated hemoglobin A1c -     ONETOUCH VERIO test strip; 1 each by Other route as needed for other. -     metFORMIN (GLUCOPHAGE) 500 MG tablet; Take 1 tablet (500 mg total) by mouth daily with breakfast.  Other orders -     hydrochlorothiazide (HYDRODIURIL) 12.5 MG tablet; TAKE 1 TABLET (12.5 MG) BY MOUTH DAILY   Follow-up pending labs, yearly for physical

## 2022-01-05 NOTE — Patient Instructions (Signed)
This visit was a preventative care visit, also known as wellness visit or routine physical.   Topics typically include healthy lifestyle, diet, exercise, preventative care, vaccinations, sick and well care, proper use of emergency dept and after hours care, as well as other concerns.     Recommendations: Continue to return yearly for your annual wellness and preventative care visits.  This gives [place] a chance to discuss healthy lifestyle, exercise, vaccinations, review your chart record, and perform screenings where appropriate.  I recommend you see your eye doctor yearly for routine vision care.  I recommend you see your dentist yearly for routine dental care including hygiene visits twice yearly.  See your gynecologist yearly for routine gynecological care.   Vaccination recommendations were reviewed Immunization History  Administered Date(s) Administered   PFIZER(Purple Top)SARS-COV-2 Vaccination 06/28/2019, 07/23/2019, 06/02/2020   Tdap 04/25/2017   I recommend shingles, flu and pneumococal vaccines.  You decline those today   Screening for cancer: Colon cancer screening: Due next year 2024  Breast cancer screening: You should perform a self breast exam monthly.   We reviewed recommendations for regular mammograms and breast cancer screening.  Cervical cancer screening: We reviewed recommendations for pap smear screening.   Skin cancer screening: Check your skin regularly for new changes, growing lesions, or other lesions of concern Come in for evaluation if you have skin lesions of concern.  Lung cancer screening: If you have a greater than 20 pack year history of tobacco use, then you may qualify for lung cancer screening with a chest CT scan.   Please call your insurance company to inquire about coverage for this test.  We currently don't have screenings for other cancers besides breast, cervical, colon, and lung cancers.  If you have a strong family history of cancer  or have other cancer screening concerns, please let me know.    Bone health: Get at least 150 minutes of aerobic exercise weekly Get weight bearing exercise at least once weekly Bone density test:  A bone density test is an imaging test that uses a type of X-ray to measure the amount of calcium and other minerals in your bones. The test may be used to diagnose or screen you for a condition that causes weak or thin bones (osteoporosis), predict your risk for a broken bone (fracture), or determine how well your osteoporosis treatment is working. The bone density test is recommended for females 16 and older, or females or males <62 if certain risk factors such as thyroid disease, long term use of steroids such as for asthma or rheumatological issues, vitamin D deficiency, estrogen deficiency, family history of osteoporosis, self or family history of fragility fracture in first degree relative.    Heart health: Get at least 150 minutes of aerobic exercise weekly Limit alcohol It is important to maintain a healthy blood pressure and healthy cholesterol numbers  Heart disease screening: Screening for heart disease includes screening for blood pressure, fasting lipids, glucose/diabetes screening, BMI height to weight ratio, reviewed of smoking status, physical activity, and diet.    Goals include blood pressure 120/80 or less, maintaining a healthy lipid/cholesterol profile, preventing diabetes or keeping diabetes numbers under good control, not smoking or using tobacco products, exercising most days per week or at least 150 minutes per week of exercise, and eating healthy variety of fruits and vegetables, healthy oils, and avoiding unhealthy food choices like fried food, fast food, high sugar and high cholesterol foods.    Other tests may possibly include  EKG test, CT coronary calcium score, echocardiogram, exercise treadmill stress test.    Medical care options: I recommend you continue to seek  care here first for routine care.  We try really hard to have available appointments Monday through Friday daytime hours for sick visits, acute visits, and physicals.  Urgent care should be used for after hours and weekends for significant issues that cannot wait till the next day.  The emergency department should be used for significant potentially life-threatening emergencies.  The emergency department is expensive, can often have long wait times for less significant concerns, so try to utilize primary care, urgent care, or telemedicine when possible to avoid unnecessary trips to the emergency department.  Virtual visits and telemedicine have been introduced since the pandemic started in 2020, and can be convenient ways to receive medical care.  We offer virtual appointments as well to assist you in a variety of options to seek medical care.   Advanced Directives: I recommend you consider completing a [place] and Living Will.   These documents respect your wishes and help alleviate burdens on your loved ones if you were to become terminally ill or be in a position to need those documents enforced.    You can complete Advanced Directives yourself, have them notarized, then have copies made for our office, for you and for anybody you feel should have them in safe keeping.  Or, you can have an attorney prepare these documents.   If you haven't updated your Last Will and Testament in a while, it may be worthwhile having an attorney prepare these documents together and save on some costs.       Separate significant issues discussed: Hypertension-continue hydrochlorothiazide 12.5 mg daily, amlodipine 10 mg daily  Hyperlipidemia-continue Lipitor 10 mg daily  preDiabetes-continue metformin  Work on getting exercise regularly  Work on losing weight through diet and exercise

## 2022-01-06 ENCOUNTER — Other Ambulatory Visit: Payer: Self-pay | Admitting: Medical

## 2022-01-06 DIAGNOSIS — G8929 Other chronic pain: Secondary | ICD-10-CM

## 2022-01-06 LAB — CBC
Hematocrit: 42.3 % (ref 34.0–46.6)
Hemoglobin: 14.2 g/dL (ref 11.1–15.9)
MCH: 30.7 pg (ref 26.6–33.0)
MCHC: 33.6 g/dL (ref 31.5–35.7)
MCV: 92 fL (ref 79–97)
Platelets: 305 10*3/uL (ref 150–450)
RBC: 4.62 x10E6/uL (ref 3.77–5.28)
RDW: 11.8 % (ref 11.7–15.4)
WBC: 3.9 10*3/uL (ref 3.4–10.8)

## 2022-01-06 LAB — COMPREHENSIVE METABOLIC PANEL
ALT: 23 IU/L (ref 0–32)
AST: 16 IU/L (ref 0–40)
Albumin/Globulin Ratio: 1.7 (ref 1.2–2.2)
Albumin: 4.5 g/dL (ref 3.8–4.9)
Alkaline Phosphatase: 78 IU/L (ref 44–121)
BUN/Creatinine Ratio: 16 (ref 12–28)
BUN: 15 mg/dL (ref 8–27)
Bilirubin Total: 0.6 mg/dL (ref 0.0–1.2)
CO2: 23 mmol/L (ref 20–29)
Calcium: 10 mg/dL (ref 8.7–10.3)
Chloride: 106 mmol/L (ref 96–106)
Creatinine, Ser: 0.92 mg/dL (ref 0.57–1.00)
Globulin, Total: 2.7 g/dL (ref 1.5–4.5)
Glucose: 115 mg/dL — ABNORMAL HIGH (ref 70–99)
Potassium: 4.2 mmol/L (ref 3.5–5.2)
Sodium: 145 mmol/L — ABNORMAL HIGH (ref 134–144)
Total Protein: 7.2 g/dL (ref 6.0–8.5)
eGFR: 71 mL/min/{1.73_m2} (ref >59)

## 2022-01-06 LAB — LIPID PANEL
Chol/HDL Ratio: 3.1 ratio (ref 0.0–4.4)
Cholesterol, Total: 158 mg/dL (ref 100–199)
HDL: 51 mg/dL (ref >39)
LDL Chol Calc (NIH): 94 mg/dL (ref 0–99)
Triglycerides: 65 mg/dL (ref 0–149)
VLDL Cholesterol Cal: 13 mg/dL (ref 5–40)

## 2022-01-06 LAB — HEMOGLOBIN A1C
Est. average glucose Bld gHb Est-mCnc: 140 mg/dL
Hgb A1c MFr Bld: 6.5 % — ABNORMAL HIGH (ref 4.8–5.6)

## 2022-01-06 LAB — RPR: RPR Ser Ql: NONREACTIVE

## 2022-01-06 LAB — HIV ANTIBODY (ROUTINE TESTING W REFLEX): HIV Screen 4th Generation wRfx: NONREACTIVE

## 2022-01-06 LAB — VITAMIN D 25 HYDROXY (VIT D DEFICIENCY, FRACTURES): Vit D, 25-Hydroxy: 30.6 ng/mL (ref 30.0–100.0)

## 2022-01-06 LAB — HEPATITIS B SURFACE ANTIGEN: Hepatitis B Surface Ag: NEGATIVE

## 2022-01-06 LAB — HEPATITIS C ANTIBODY: Hep C Virus Ab: NONREACTIVE

## 2022-01-06 MED ORDER — VITAMIN D 50 MCG (2000 UT) PO CAPS: 1.0000 | ORAL_CAPSULE | Freq: Every day | ORAL | 3 refills | Status: DC

## 2022-01-11 ENCOUNTER — Encounter: Payer: Self-pay | Admitting: Internal Medicine

## 2022-03-22 IMAGING — MG MM DIGITAL SCREENING BILAT W/ TOMO AND CAD
6 of 12 series · 6 of 36 positions shown · non-contrast
Comparison: None.

CLINICAL DATA: Screening.

EXAM:
DIGITAL SCREENING BILATERAL MAMMOGRAM WITH TOMOSYNTHESIS AND CAD
TECHNIQUE: Bilateral screening digital craniocaudal and mediolateral oblique
mammograms were obtained. Bilateral screening digital breast
tomosynthesis was performed. The images were evaluated with
computer-aided detection.

[R CV synth-2D]
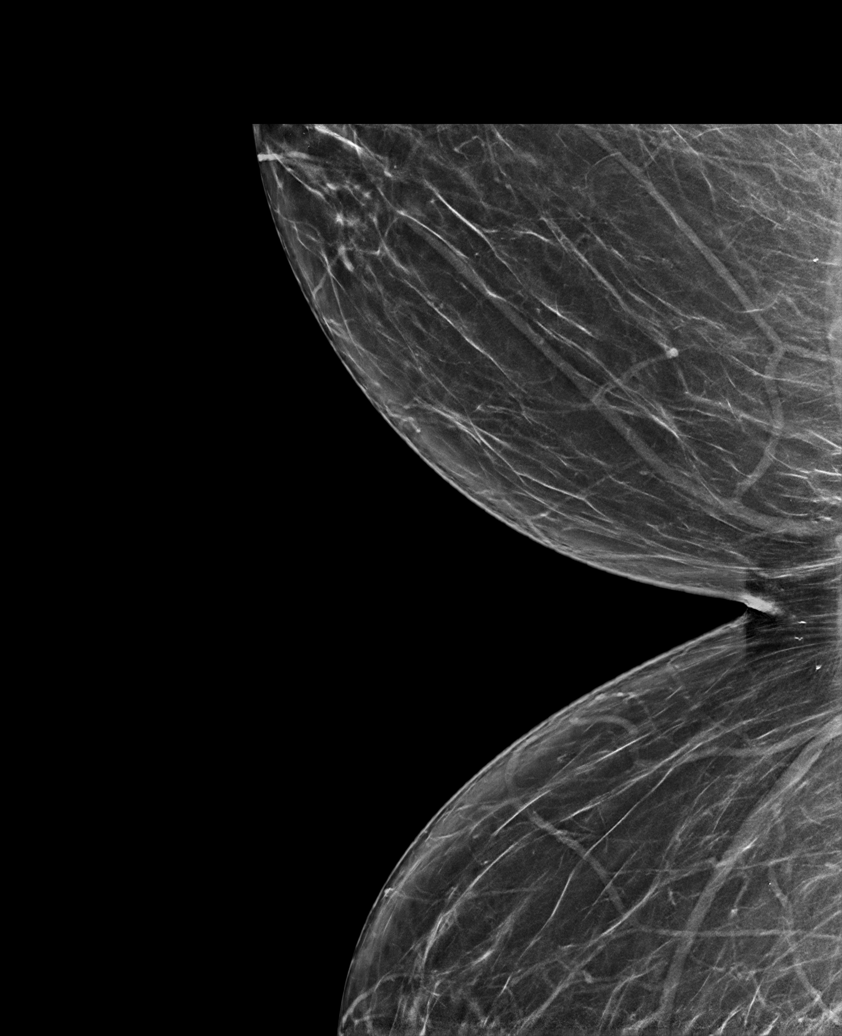

[R MLO synth-2D (1 of 2)]
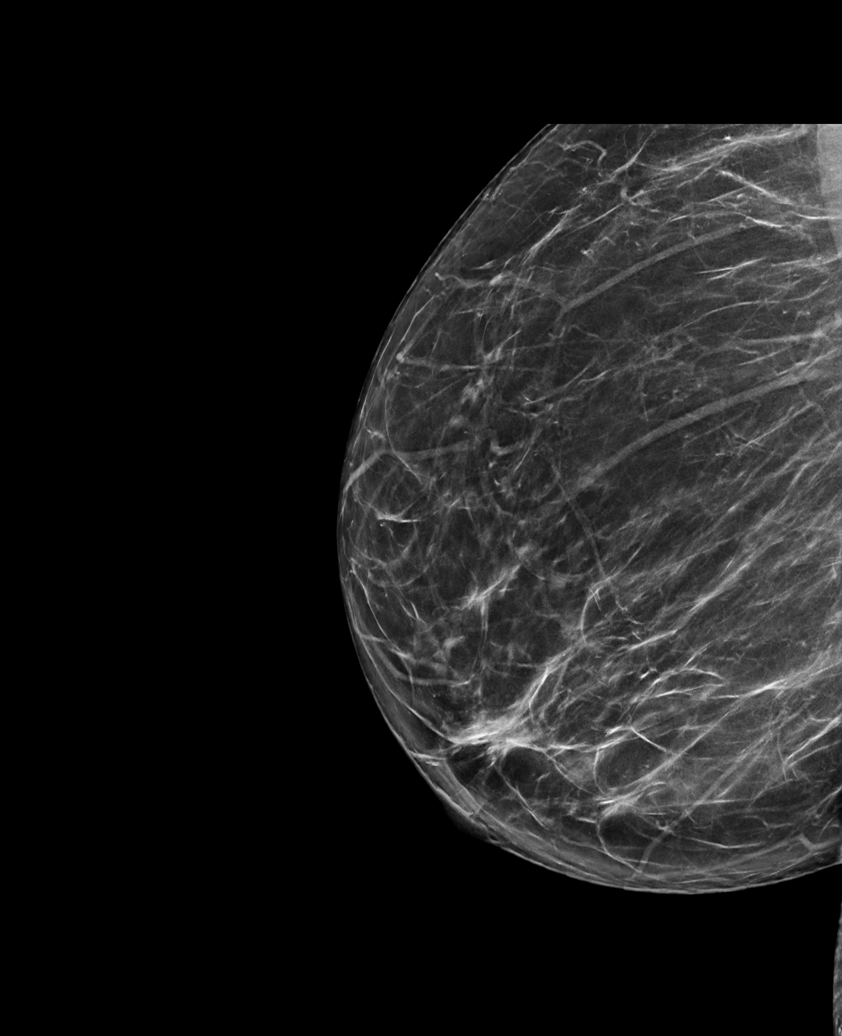

[R MLO synth-2D (2 of 2)]
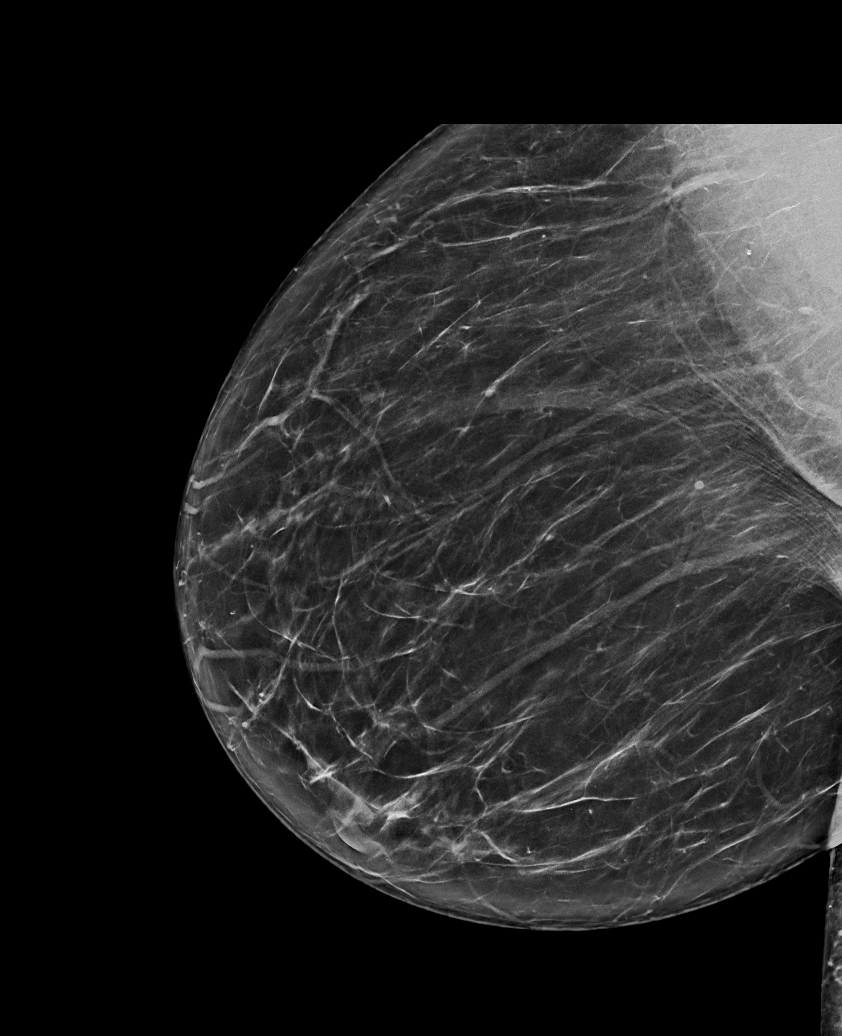

[L MLO synth-2D]
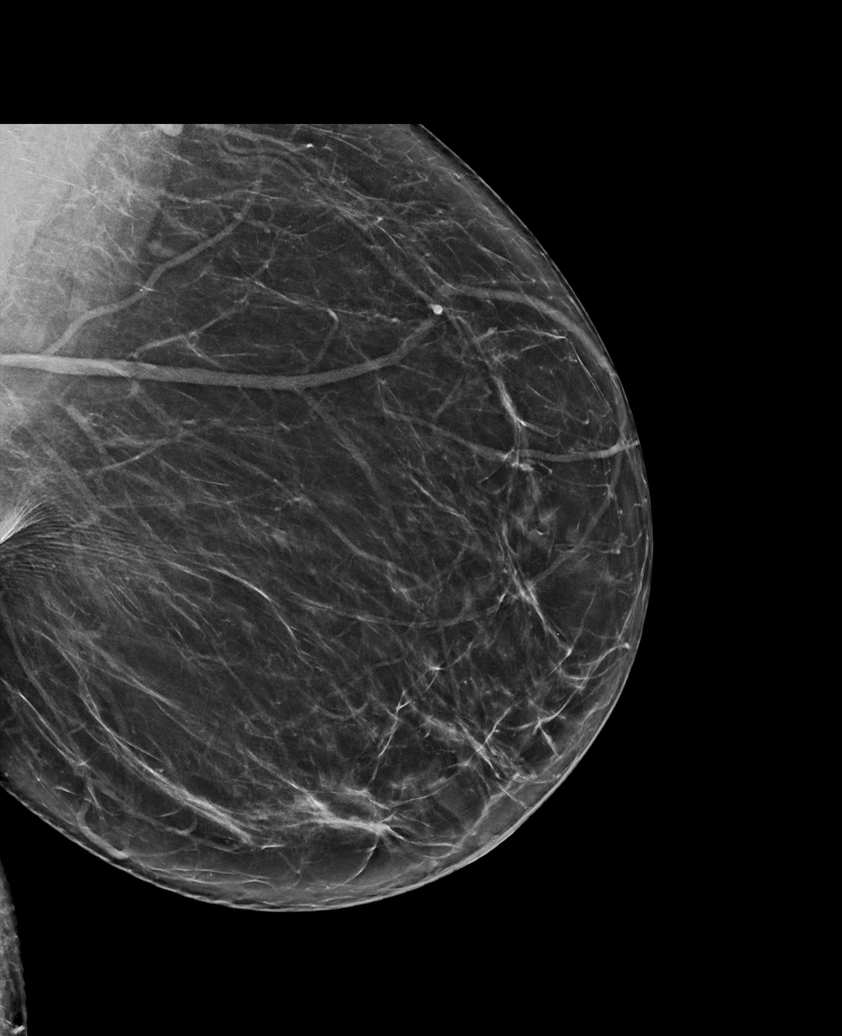

[L CC synth-2D]
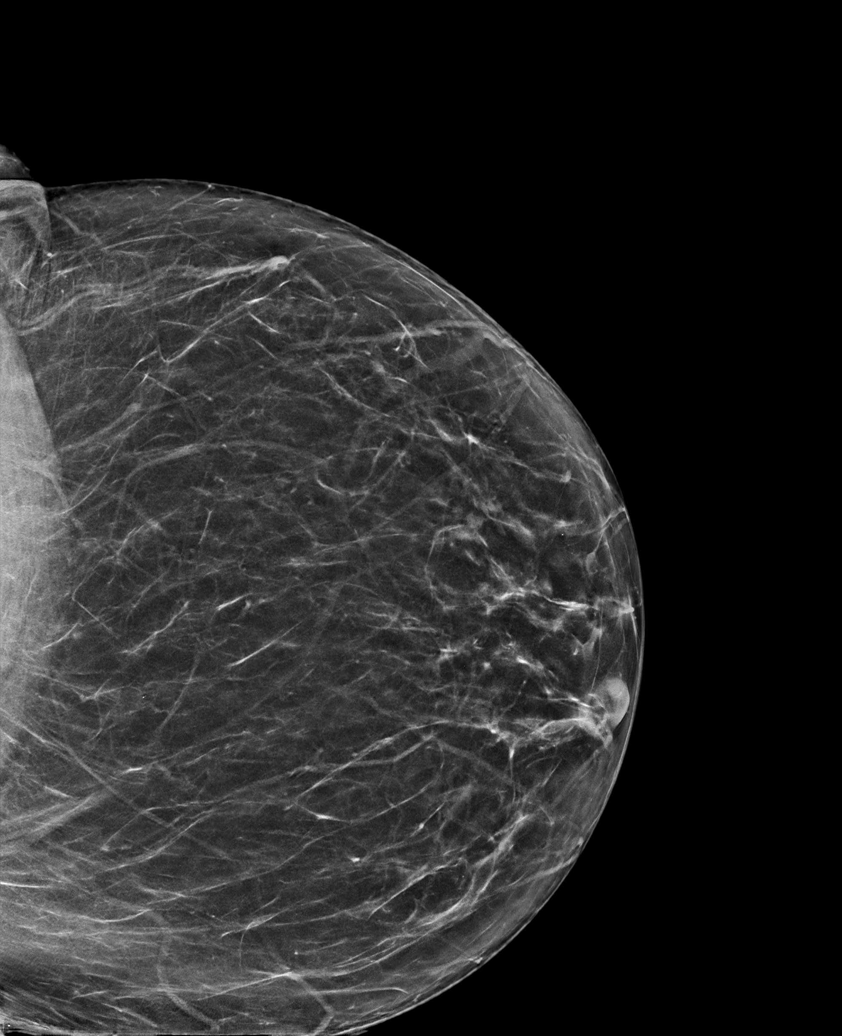

[R CC synth-2D]
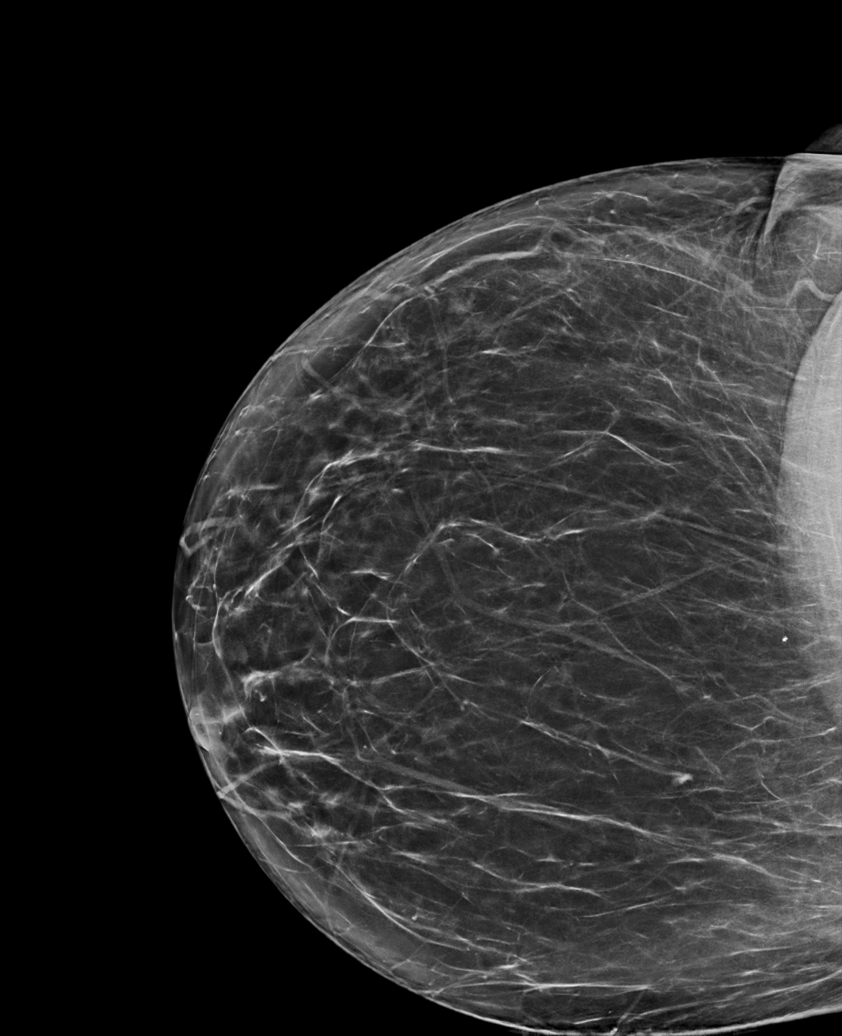

[6 of 36 positions shown; findings below may reference images not displayed]

ACR Breast Density Category b: There are scattered areas of
fibroglandular density.
FINDINGS: There are no findings suspicious for malignancy.
IMPRESSION: No mammographic evidence of malignancy. A result letter of this
screening mammogram will be mailed directly to the patient.

RECOMMENDATION:
Screening mammogram in one year. (Code:XG-X-X7B)

BI-RADS CATEGORY  1: Negative.

## 2022-05-19 ENCOUNTER — Telehealth: Payer: Self-pay | Admitting: Medical

## 2022-05-19 NOTE — Telephone Encounter (Signed)
[facility] medical record request forwarded to [facility]

## 2022-05-29 DIAGNOSIS — J101 Influenza due to other identified influenza virus with other respiratory manifestations: Secondary | ICD-10-CM | POA: Diagnosis not present

## 2022-05-29 DIAGNOSIS — R519 Headache, unspecified: Secondary | ICD-10-CM | POA: Diagnosis not present

## 2022-06-27 ENCOUNTER — Encounter: Payer: Self-pay | Admitting: Family Medicine

## 2022-06-27 ENCOUNTER — Ambulatory Visit (INDEPENDENT_AMBULATORY_CARE_PROVIDER_SITE_OTHER): Payer: 59 | Admitting: Family Medicine

## 2022-06-27 VITALS — BP 130/80 | HR 80 | Temp 98.0°F | Ht 62.0 in | Wt 206.0 lb

## 2022-06-27 DIAGNOSIS — I10 Essential (primary) hypertension: Secondary | ICD-10-CM | POA: Diagnosis not present

## 2022-06-27 DIAGNOSIS — R058 Other specified cough: Secondary | ICD-10-CM

## 2022-06-27 MED ORDER — DOXYCYCLINE HYCLATE 100 MG PO TABS: 100.0000 mg | ORAL_TABLET | Freq: Two times a day (BID) | ORAL | 0 refills | Status: DC

## 2022-06-27 NOTE — Progress Notes (Signed)
Chief Complaint  Patient presents with   Cough    Patient has had a cough for over a month. Bringing up thick yellow mucus when she coughs. Seen at [facility] 05/25/22 for flu. Took tamilfu, prednisone and was given promethazine DM. Still not better.    She had the flu last month.  Those symptoms improved, but she has had ongoing cough since that time. The cough keeps her awake at night. She coughs up gold-colored phlegm. She expectorates up yellow/gold phlegm from the throat. She denies any fever, shortness of breath. No headaches or sinus pain. No chest pain.  No sick contacts   PMH, PSH, SH reviewed  HTN, DM, HLD, glaucoma  Outpatient Encounter Medications as of 06/27/2022  Medication Sig Note   amLODipine (NORVASC) 10 MG tablet Take 1 tablet (10 mg total) by mouth daily.    atorvastatin (LIPITOR) 10 MG tablet Take 1 tablet (10 mg total) by mouth daily.    Blood Glucose Monitoring Suppl (ONETOUCH VERIO FLEX SYSTEM) w/Device KIT USE TO TEST DAILY.    Cholecalciferol (VITAMIN D) 50 MCG (2000 UT) CAPS Take 1 capsule (2,000 Units total) by mouth daily.    dorzolamide-timolol (COSOPT) 22.3-6.8 MG/ML ophthalmic solution 1 drop 2 (two) times daily.    hydrochlorothiazide (HYDRODIURIL) 12.5 MG tablet TAKE 1 TABLET (12.5 MG) BY MOUTH DAILY    Lancets (ONETOUCH DELICA PLUS 123XX123) MISC Inject 1 each into the skin daily.    LUMIGAN 0.01 % SOLN     metFORMIN (GLUCOPHAGE) 500 MG tablet Take 1 tablet (500 mg total) by mouth daily with breakfast. (Patient taking differently: Take 500 mg by mouth 2 (two) times daily with a meal.)    Multiple Vitamin (MULTIVITAMIN) tablet Take 1 tablet by mouth daily.    ONETOUCH VERIO test strip 1 each by Other route as needed for other.    methylPREDNISolone (MEDROL DOSEPAK) 4 MG TBPK tablet Take 4 mg by mouth See admin instructions. (Patient not taking: Reported on 06/27/2022) 06/27/2022: finished   oseltamivir (TAMIFLU) 75 MG capsule Take 75 mg by mouth 2 (two) times  daily. (Patient not taking: Reported on 06/27/2022) 06/27/2022: finished   promethazine-dextromethorphan (PROMETHAZINE-DM) 6.25-15 MG/5ML syrup Take 5 mLs by mouth every 4 (four) hours. (Patient not taking: Reported on 06/27/2022) 06/27/2022: finished   [DISCONTINUED] diclofenac (VOLTAREN) 75 MG EC tablet Take 1 tablet (75 mg total) by mouth 2 (two) times daily.    [DISCONTINUED] gabapentin (NEURONTIN) 100 MG capsule Take 1 capsule (100 mg total) by mouth 2 (two) times daily.    No facility-administered encounter medications on file as of 06/27/2022.   No Known Allergies  ROS: no f/c, n/v/d, headaches, dizziness, chest pain, shortness of breath. +itchy ears, PND and cough. See HPI.  No bleeding, bruising, rash   PHYSICAL EXAM:  BP 130/80   Pulse 80   Temp 98 F (36.7 C) (Tympanic)   Ht 5\' 2"  (1.575 m)   Wt 206 lb (93.4 kg)   LMP 04/04/2012   BMI 37.68 kg/m   Well-appearing, obese female, in no distress. No throat-clearing or coughing during visit. Normal speech. HEENT: conjunctiva and sclera are clear, EOMI. OP clear.  Inferior portion of R EAC is inflamed (scratches at with her finger) TM's normal bilaterally Nasal mucosa is mild-mod edematous, no erythema. Clear mucus. Sinuses are nontender. Heart: regular rate and rhythm Lungs: clear bilaterally Neuro: alert and oriented, cranial nerves grossly normal, normal gait.   ASSESSMENT/PLAN:  Cough productive of purulent sputum - ddx sinusitis  vs bronchitis.  Exam normal, based on history. Tx w/ABX and mucinex DM. May have underlying allergies, antihistamines recommended - Plan: doxycycline (VIBRA-TABS) 100 MG tablet  Essential hypertension - controlled; to avoid decongestants  Drink plenty of water. You may have a component of allergies (causing some of the throat drainage and cough, and also the itchy ears).  You can take an antihistamine such as claritin or zyrtec or allegra (not the D version, as these raise your blood  pressure). I recommend that you get Mucinex DM 12 hour, and take it twice daily. This has an expectorant to loosen up the mucus, and also a cough suppressant. Take the antibiotic twice daily. Contact us next week if you aren't better, sooner if you need something in addition to help with the cough.

## 2022-06-27 NOTE — Patient Instructions (Addendum)
Drink plenty of water. You may have a component of allergies (causing some of the throat drainage and cough, and also the itchy ears).  You can take an antihistamine such as claritin or zyrtec or allegra (not the D version, as these raise your blood pressure). I recommend that you get Mucinex DM 12 hour, and take it twice daily. This has an expectorant to loosen up the mucus, and also a cough suppressant. Take the antibiotic (doxycycline) twice daily. Be sure to wear sunscreen if outside in the sun, while on the antibiotic. Contact us next week if you aren't better, sooner if you need something in addition to help with the cough.

## 2022-07-14 ENCOUNTER — Encounter: Payer: 59 | Admitting: Medical

## 2022-08-04 ENCOUNTER — Encounter: Payer: Self-pay | Admitting: Obstetrics & Gynecology

## 2022-08-04 ENCOUNTER — Ambulatory Visit (INDEPENDENT_AMBULATORY_CARE_PROVIDER_SITE_OTHER): Payer: 59 | Admitting: Obstetrics & Gynecology

## 2022-08-04 VITALS — BP 110/70 | HR 59

## 2022-08-04 DIAGNOSIS — N898 Other specified noninflammatory disorders of vagina: Secondary | ICD-10-CM | POA: Diagnosis not present

## 2022-08-04 LAB — WET PREP FOR TRICH, YEAST, CLUE

## 2022-08-04 NOTE — Progress Notes (Signed)
    Amy [name] [birth year] [id number]        62 y.o.  [id number] Single  RP: Internal vaginal itching & burning for 3 weeks  HPI: Pt c/o internal vaginal itching & burning for 3 weeks.  Sexually active a few months ago with a new partner.  No pelvic pain. No PMB. No UTI Sx.  No fever.   OB History  Gravida Para Term Preterm AB Living  4       1 3   SAB IAB Ectopic Multiple Live Births  1       3    # Outcome Date GA Lbr Len/2nd Weight Sex Delivery Anes PTL Lv  4 Gravida           3 Gravida           2 Gravida           1 SAB             Past medical history,surgical history, problem list, medications, allergies, family history and social history were all reviewed and documented in the EPIC chart.   Directed ROS with pertinent positives and negatives documented in the history of present illness/assessment and plan.  Exam:  Vitals:   08/04/22 1012  BP: 110/70  Pulse: (!) 59  SpO2: 99%   General appearance:  Normal  Gynecologic exam: Vulva normal.  Speculum:  Cervix/Vagina normal.  Mild white secretions.  Wet prep done.  SureSwab Advanced Plus done.  Wet prep Negative   Assessment/Plan:  62 y.o. [id number]   1. Vagina itching Pt c/o internal vaginal itching & burning for 3 weeks.  Sexually active a few months ago with a new partner.  No pelvic pain. No PMB. No UTI Sx.  No fever.  Normal gyn exam.  Wet prep Negative.  Pending SureSwab Advanced Plus.  Suggest Probiotic, yogourt and/or Boric Acid to improve her vaginal flora. - WET PREP FOR TRICH, YEAST, CLUE - SureSwab Advanced Vaginitis Plus,TMA   Amy Del MD, 10:32 AM 08/04/2022

## 2022-08-05 LAB — SURESWAB® ADVANCED VAGINITIS PLUS,TMA
C. trachomatis RNA, TMA: NOT DETECTED
CANDIDA SPECIES: NOT DETECTED
Candida glabrata: NOT DETECTED
N. gonorrhoeae RNA, TMA: NOT DETECTED
SURESWAB(R) ADV BACTERIAL VAGINOSIS(BV),TMA: NEGATIVE
TRICHOMONAS VAGINALIS (TV),TMA: NOT DETECTED

## 2022-08-10 ENCOUNTER — Ambulatory Visit (INDEPENDENT_AMBULATORY_CARE_PROVIDER_SITE_OTHER): Payer: 59 | Admitting: Medical

## 2022-08-10 VITALS — BP 118/64 | HR 60 | Wt 208.8 lb

## 2022-08-10 DIAGNOSIS — E7800 Pure hypercholesterolemia, unspecified: Secondary | ICD-10-CM

## 2022-08-10 DIAGNOSIS — E559 Vitamin D deficiency, unspecified: Secondary | ICD-10-CM | POA: Diagnosis not present

## 2022-08-10 DIAGNOSIS — G8929 Other chronic pain: Secondary | ICD-10-CM

## 2022-08-10 DIAGNOSIS — M545 Low back pain, unspecified: Secondary | ICD-10-CM | POA: Diagnosis not present

## 2022-08-10 DIAGNOSIS — I10 Essential (primary) hypertension: Secondary | ICD-10-CM

## 2022-08-10 DIAGNOSIS — M609 Myositis, unspecified: Secondary | ICD-10-CM

## 2022-08-10 DIAGNOSIS — T466X5A Adverse effect of antihyperlipidemic and antiarteriosclerotic drugs, initial encounter: Secondary | ICD-10-CM

## 2022-08-10 DIAGNOSIS — E1165 Type 2 diabetes mellitus with hyperglycemia: Secondary | ICD-10-CM | POA: Diagnosis not present

## 2022-08-10 MED ORDER — ROSUVASTATIN CALCIUM 5 MG PO TABS: 5.0000 mg | ORAL_TABLET | Freq: Every day | ORAL | 0 refills | Status: DC

## 2022-08-10 NOTE — Progress Notes (Signed)
Subjective:  Amy [name] is a 62 y.o. female who presents for Chief Complaint  Patient presents with   med check    Med check, follow-up on cholesterol and diabetes     Here for med check  Diabetes - compliant with metformin 500mg  once daily.   Uses glucometer most days.   Sometimes 135 - 140s.  Highest number recently 140.    Hyperlipidemia - taking atorvastatin most days.   Sometimes gets muscle aches so hasn't been using the cholesterol medication in past 2 months  HTN - taking Norvasc 10mg  daily, hydrochlorothiazide 12.5mg  daily,   Exercise - walks on treadmill regularly.  No other aggravating or relieving factors.    No other c/o.  Past Medical History:  Diagnosis Date   Abnormal Pap smear of cervix    Endometrial polyp 2016   [facility]   GERD (gastroesophageal reflux disease)    Glaucoma    Hyperlipidemia    Hypertension    Leiomyoma of uterus 04/29/2019   Prediabetes    Thickened endometrium 2016   per [facility] records   Vitamin D deficiency 01/15/2018   Current Outpatient Medications on File Prior to Visit  Medication Sig Dispense Refill   amLODipine (NORVASC) 10 MG tablet Take 1 tablet (10 mg total) by mouth daily. 90 tablet 3   Blood Glucose Monitoring Suppl (ONETOUCH VERIO FLEX SYSTEM) w/Device KIT USE TO TEST DAILY.     Cholecalciferol (VITAMIN D) 50 MCG (2000 UT) CAPS Take 1 capsule (2,000 Units total) by mouth daily. 90 capsule 3   dorzolamide-timolol (COSOPT) 22.3-6.8 MG/ML ophthalmic solution 1 drop 2 (two) times daily.     hydrochlorothiazide (HYDRODIURIL) 12.5 MG tablet TAKE 1 TABLET (12.5 MG) BY MOUTH DAILY 90 tablet 3   LUMIGAN 0.01 % SOLN      metFORMIN (GLUCOPHAGE) 500 MG tablet Take 1 tablet (500 mg total) by mouth daily with breakfast. (Patient taking differently: Take 500 mg by mouth 2 (two) times daily with a meal.) 90 tablet 3   Multiple Vitamin (MULTIVITAMIN) tablet Take 1 tablet by mouth daily.     ONETOUCH VERIO test strip 1  each by Other route as needed for other. 100 each 4   Lancets (ONETOUCH DELICA PLUS LANCET33G) MISC Inject 1 each into the skin daily. 100 each 0   No current facility-administered medications on file prior to visit.    The following portions of the patient's history were reviewed and updated as appropriate: allergies, current medications, past family history, past medical history, past social history, past surgical history and problem list.  ROS Otherwise as in subjective above  Objective: BP 118/64   Pulse 60   Wt 208 lb 12.8 oz (94.7 kg)   LMP 04/04/2012 Comment: not sexually active  BMI 38.19 kg/m   BP Readings from Last 3 Encounters:  08/10/22 118/64  08/04/22 110/70  06/27/22 130/80   Wt Readings from Last 3 Encounters:  08/10/22 208 lb 12.8 oz (94.7 kg)  06/27/22 206 lb (93.4 kg)  01/05/22 193 lb 12.8 oz (87.9 kg)    General appearance: alert, no distress, well developed, well nourished Neck: supple, no lymphadenopathy, no thyromegaly, no masses Heart: RRR, normal S1, S2, no murmurs Lungs: CTA bilaterally, no wheezes, rhonchi, or rales Abdomen: +bs, soft, non tender, non distended, no masses, no hepatomegaly, no splenomegaly Pulses: 2+ radial pulses, 2+ pedal pulses, normal cap refill Ext: no edema  Diabetic Foot Exam - Simple   Simple Foot Form Visual Inspection  No deformities, no ulcerations, no other skin breakdown bilaterally: Yes Sensation Testing Intact to touch and monofilament testing bilaterally: Yes Pulse Check See comments: Yes Comments 1+ pedal pulses      Assessment: Encounter Diagnoses  Name Primary?   Type 2 diabetes mellitus with hyperglycemia, without long-term current use of insulin (HCC) Yes   Essential hypertension    Chronic bilateral low back pain without sciatica    Elevated LDL cholesterol level    Vitamin D deficiency    Statin-induced myositis      Plan: Diabetes Updated labs today, continue metformin 500 mg  daily Continue glucose monitoring  Hyperlipidemia Changed to Crestor due to myopathy with atorvastatin since last visit  Statin induced myositis on a atorvastatin, changed to Crestor trial  Hypertension-at goal, continue amlodipine 10 mg daily and hydrochlorothiazide 12.5 mg daily  Chronic back pain-unchanged, no current issues today    Amy [name] was seen today for med check.  Diagnoses and all orders for this visit:  Type 2 diabetes mellitus with hyperglycemia, without long-term current use of insulin (HCC) -     Microalbumin/Creatinine Ratio, Urine -     Hemoglobin A1c -     Comprehensive metabolic panel  Essential hypertension -     Comprehensive metabolic panel  Chronic bilateral low back pain without sciatica  Elevated LDL cholesterol level  Vitamin D deficiency  Statin-induced myositis  Other orders -     rosuvastatin (CRESTOR) 5 MG tablet; Take 1 tablet (5 mg total) by mouth daily.    Follow up: pending labs

## 2022-08-11 LAB — COMPREHENSIVE METABOLIC PANEL
ALT: 30 IU/L (ref 0–32)
AST: 18 IU/L (ref 0–40)
Albumin/Globulin Ratio: 1.9 (ref 1.2–2.2)
Albumin: 4.5 g/dL (ref 3.8–4.9)
Alkaline Phosphatase: 79 IU/L (ref 44–121)
BUN/Creatinine Ratio: 14 (ref 12–28)
BUN: 11 mg/dL (ref 8–27)
Bilirubin Total: 0.4 mg/dL (ref 0.0–1.2)
CO2: 21 mmol/L (ref 20–29)
Calcium: 9.5 mg/dL (ref 8.7–10.3)
Chloride: 106 mmol/L (ref 96–106)
Creatinine, Ser: 0.8 mg/dL (ref 0.57–1.00)
Globulin, Total: 2.4 g/dL (ref 1.5–4.5)
Glucose: 124 mg/dL — ABNORMAL HIGH (ref 70–99)
Potassium: 4.1 mmol/L (ref 3.5–5.2)
Sodium: 143 mmol/L (ref 134–144)
Total Protein: 6.9 g/dL (ref 6.0–8.5)
eGFR: 84 mL/min/{1.73_m2} (ref >59)

## 2022-08-11 LAB — HEMOGLOBIN A1C
Est. average glucose Bld gHb Est-mCnc: 154 mg/dL
Hgb A1c MFr Bld: 7 % — ABNORMAL HIGH (ref 4.8–5.6)

## 2022-08-11 LAB — MICROALBUMIN / CREATININE URINE RATIO
Creatinine, Urine: 335.4 mg/dL
Microalb/Creat Ratio: 5 mg/g creat (ref 0–29)
Microalbumin, Urine: 18.1 ug/mL

## 2022-08-12 NOTE — Progress Notes (Signed)
Results sent through MyChart

## 2022-08-15 ENCOUNTER — Other Ambulatory Visit: Payer: Self-pay | Admitting: Medical

## 2022-08-15 DIAGNOSIS — R7309 Other abnormal glucose: Secondary | ICD-10-CM

## 2022-08-15 MED ORDER — METFORMIN HCL 500 MG PO TABS: 500.0000 mg | ORAL_TABLET | Freq: Two times a day (BID) | ORAL | 2 refills | Status: DC

## 2022-10-03 ENCOUNTER — Ambulatory Visit: Payer: 59 | Admitting: Obstetrics & Gynecology

## 2022-11-07 ENCOUNTER — Other Ambulatory Visit: Payer: Self-pay | Admitting: Medical

## 2022-11-14 ENCOUNTER — Encounter: Payer: 59 | Admitting: Medical

## 2022-11-28 ENCOUNTER — Telehealth: Payer: Self-pay | Admitting: Medical

## 2022-11-28 MED ORDER — VITAMIN D 50 MCG (2000 UT) PO CAPS: 1.0000 | ORAL_CAPSULE | Freq: Every day | ORAL | 0 refills | Status: DC

## 2022-11-28 NOTE — Telephone Encounter (Signed)
done

## 2022-11-28 NOTE — Telephone Encounter (Signed)
Pt requesting refill on vitamin d to [id number] [place],  - [address]

## 2022-11-29 ENCOUNTER — Telehealth: Payer: Self-pay | Admitting: Medical

## 2022-11-29 NOTE — Telephone Encounter (Signed)
Pt came by after going to pharmacy & picked up Metformin & was for only #30 1 every day & she is supposed to take BID #60.  I called pharmacy & pt had got a refill on an old Rx.  Had pharmacy inactivate that Rx & they will refill the correct one in 2 days.  Called pt and left message

## 2022-11-30 ENCOUNTER — Other Ambulatory Visit (HOSPITAL_COMMUNITY)
Admission: RE | Admit: 2022-11-30 | Discharge: 2022-11-30 | Disposition: A | Discharge status: Home / Self Care | Payer: 59 | Source: Ambulatory Visit | Attending: Radiology | Admitting: Radiology

## 2022-11-30 ENCOUNTER — Ambulatory Visit (INDEPENDENT_AMBULATORY_CARE_PROVIDER_SITE_OTHER): Payer: 59 | Admitting: Radiology

## 2022-11-30 ENCOUNTER — Encounter: Payer: Self-pay | Admitting: Radiology

## 2022-11-30 VITALS — BP 112/70 | Ht 62.0 in | Wt 205.0 lb

## 2022-11-30 DIAGNOSIS — N870 Mild cervical dysplasia: Secondary | ICD-10-CM | POA: Diagnosis not present

## 2022-11-30 DIAGNOSIS — Z01419 Encounter for gynecological examination (general) (routine) without abnormal findings: Secondary | ICD-10-CM | POA: Insufficient documentation

## 2022-11-30 NOTE — Progress Notes (Signed)
   Amy [name] April 22, 1960 [id number]   History: Postmenopausal 62 y.o. presents for annual exam. No new gyn concerns. Doing well. Denies PMB, overdue for mammogram. Has a PCP.   Gynecologic History Postmenopausal Last Pap: 2023. Results were: normal, 2022 CIN 1 on colpo Last mammogram: 2022. Results were: normal Last colonoscopy: 2019   Obstetric History OB History  Gravida Para Term Preterm AB Living  4       1 3   SAB IAB Ectopic Multiple Live Births  1       3    # Outcome Date GA Lbr Len/2nd Weight Sex Type Anes PTL Lv  4 Gravida           3 Gravida           2 Gravida           1 SAB              The following portions of the patient's history were reviewed and updated as appropriate: allergies, current medications, past family history, past medical history, past social history, past surgical history, and problem list.  Review of Systems Pertinent items noted in HPI and remainder of comprehensive ROS otherwise negative.  Past medical history, past surgical history, family history and social history were all reviewed and documented in the EPIC chart.  Exam:  Vitals:   11/30/22 1535  BP: 112/70  Weight: 205 lb (93 kg)  Height: 5\' 2"  (1.575 m)   Body mass index is 37.49 kg/m.  General appearance:  Normal Thyroid:  Symmetrical, normal in size, without palpable masses or nodularity. Respiratory  Auscultation:  Clear without wheezing or rhonchi Cardiovascular  Auscultation:  Regular rate, without rubs, murmurs or gallops  Edema/varicosities:  Not grossly evident Abdominal  Soft,nontender, without masses, guarding or rebound.  Liver/spleen:  No organomegaly noted  Hernia:  None appreciated  Skin  Inspection:  Grossly normal Breasts: Examined lying and sitting.   Right: Without masses, retractions, nipple discharge or axillary adenopathy.   Left: Without masses, retractions, nipple discharge or axillary adenopathy. Genitourinary   Inguinal/mons:  Normal  without inguinal adenopathy  External genitalia:  Normal appearing vulva with no masses, tenderness, or lesions  BUS/Urethra/Skene's glands:  Normal  Vagina:  Normal appearing with normal color and discharge, no lesions. Atrophy: mild   Cervix:  Normal appearing without discharge or lesions  Uterus:  Normal in size, shape and contour.  Midline and mobile, nontender  Adnexa/parametria:     Rt: Normal in size, without masses or tenderness.   Lt: Normal in size, without masses or tenderness.  Anus and perineum: Normal    Raynelle Fanning, CMA present for exam  Assessment/Plan:   1. Well woman exam with routine gynecological exam - Cytology - PAP( [place]) - Overdue for mammogram  2. Dysplasia of cervix, low grade (CIN 1) - Cytology - PAP( [place])    Discussed SBE, colonoscopy and DEXA screening as directed. Recommend of exercise weekly, including weight bearing exercise. Encouraged the use of seatbelts and sunscreen.  Return in 1 year for annual or sooner prn.  Arlie Solomons B WHNP-BC, 3:56 PM 11/30/2022

## 2022-12-06 NOTE — Telephone Encounter (Signed)
ASCUS, HPV negative. Repeat cotesting 3 years.

## 2022-12-06 NOTE — Telephone Encounter (Signed)
Reviewed DPR, ok to speak with son, [name] Red.   MyChart message sent.   Routing to [employer].   Encounter closed.

## 2022-12-08 LAB — CYTOLOGY - PAP
Comment: NEGATIVE
Diagnosis: UNDETERMINED — AB
High risk HPV: NEGATIVE

## 2022-12-14 DIAGNOSIS — H401132 Primary open-angle glaucoma, bilateral, moderate stage: Secondary | ICD-10-CM | POA: Diagnosis not present

## 2022-12-14 DIAGNOSIS — H43813 Vitreous degeneration, bilateral: Secondary | ICD-10-CM | POA: Diagnosis not present

## 2022-12-14 LAB — HM DIABETES EYE EXAM

## 2022-12-16 ENCOUNTER — Encounter: Payer: 59 | Admitting: Medical

## 2022-12-21 ENCOUNTER — Ambulatory Visit (INDEPENDENT_AMBULATORY_CARE_PROVIDER_SITE_OTHER): Payer: 59 | Admitting: Medical

## 2022-12-21 ENCOUNTER — Encounter: Payer: Self-pay | Admitting: Medical

## 2022-12-21 VITALS — BP 122/72 | HR 66 | Wt 202.4 lb

## 2022-12-21 DIAGNOSIS — G8929 Other chronic pain: Secondary | ICD-10-CM | POA: Diagnosis not present

## 2022-12-21 DIAGNOSIS — Z2821 Immunization not carried out because of patient refusal: Secondary | ICD-10-CM | POA: Insufficient documentation

## 2022-12-21 DIAGNOSIS — Z1231 Encounter for screening mammogram for malignant neoplasm of breast: Secondary | ICD-10-CM

## 2022-12-21 DIAGNOSIS — I10 Essential (primary) hypertension: Secondary | ICD-10-CM

## 2022-12-21 DIAGNOSIS — M545 Low back pain, unspecified: Secondary | ICD-10-CM | POA: Diagnosis not present

## 2022-12-21 DIAGNOSIS — E1165 Type 2 diabetes mellitus with hyperglycemia: Secondary | ICD-10-CM | POA: Diagnosis not present

## 2022-12-21 DIAGNOSIS — E785 Hyperlipidemia, unspecified: Secondary | ICD-10-CM | POA: Diagnosis not present

## 2022-12-21 DIAGNOSIS — I952 Hypotension due to drugs: Secondary | ICD-10-CM

## 2022-12-21 MED ORDER — TRAMADOL HCL 50 MG PO TABS
50.0000 mg | ORAL_TABLET | Freq: Every day | ORAL | 0 refills | Status: AC | PRN
Start: 2022-12-21 — End: 2022-12-26

## 2022-12-21 MED ORDER — GABAPENTIN 300 MG PO CAPS
300.0000 mg | ORAL_CAPSULE | Freq: Every day | ORAL | 1 refills | Status: DC
Start: 2022-12-21 — End: 2024-01-03

## 2022-12-21 NOTE — Patient Instructions (Signed)
Diabetes-updated labs today Continue metformin 500 mg twice daily Continue to monitor your blood sugars regularly See your eye doctor yearly for diabetic eye exam and make sure they send [place] a copy of the record. I am showing that you are past due on the eye exam   Hypertension Continue Amlodipine 10 mg daily Discontinue Hydrochlorothiazide 12.5 mg daily as you are having low readings Increase your water intake to about 100 ounces per day   Hyperlipidemia Continue Crestor 5 mg daily Updated labs today   Continue vitamin D supplement   Chronic low back pain Begin back on Gabapentin 300mg  daily at bedtime You can use Ultram periodically for worse pain, but I can't prescribed for daily use.  This is a narcotic.   Screen for breast cancer: Please call to schedule your mammogram.   [facility]  [phone] [address], [address] [place], [place] [address]

## 2022-12-21 NOTE — Progress Notes (Signed)
Subjective:  Amy [name] is a 62 y.o. female who presents for Chief Complaint  Patient presents with   med check    Med check, would like cholesterol, A1c and kidney marker checked. Declines flu or covid shot     Here for routine med check. Wants labs today.  Diabetes - checking sugars regularly.  Sometimes 117s, 130s, fluctuates but relatively stable.    Taking Metformin 500mg  twice daily  HTN - compliant with medications amlodipine 10mg  and hydrochlorothiazide 12.5mg  , however given low readings, her son encouraged her to stop HCTZ this week which she did.    Hyperlipidemia - compliant with statin  Every time she takes her medications, about an hour of so later, feels bad.  She has been checking sugars and BP after this and notes some recent BPs in the 95/60 range, lower than normal.    Been having some back pain.  Has seen ortho prior, had prior EDSI, has also seen neurosurgery. Scared to have surgery.   In the past ultram worked well.  Has been on gabapentin prior but that was discontinued.    No other aggravating or relieving factors.    No other c/o.  Past Medical History:  Diagnosis Date   Abnormal Pap smear of cervix    Endometrial polyp 2016   [facility]   GERD (gastroesophageal reflux disease)    Glaucoma    Hyperlipidemia    Hypertension    Leiomyoma of uterus 04/29/2019   Prediabetes    Thickened endometrium 2016   per [facility] records   Vitamin D deficiency 01/15/2018   Current Outpatient Medications on File Prior to Visit  Medication Sig Dispense Refill   amLODipine (NORVASC) 10 MG tablet Take 1 tablet (10 mg total) by mouth daily. 90 tablet 3   Cholecalciferol (VITAMIN D) 50 MCG (2000 UT) CAPS Take 1 capsule (2,000 Units total) by mouth daily. 90 capsule 0   dorzolamide-timolol (COSOPT) 22.3-6.8 MG/ML ophthalmic solution 1 drop 2 (two) times daily.     EYSUVIS 0.25 % SUSP Apply 1 drop to eye 2 (two) times daily.     LUMIGAN 0.01 % SOLN       metFORMIN (GLUCOPHAGE) 500 MG tablet Take 1 tablet (500 mg total) by mouth 2 (two) times daily with a meal. 180 tablet 2   Multiple Vitamin (MULTIVITAMIN) tablet Take 1 tablet by mouth daily.     rosuvastatin (CRESTOR) 5 MG tablet TAKE 1 TABLET (5 MG TOTAL) BY MOUTH DAILY. 30 tablet 2   Blood Glucose Monitoring Suppl (ONETOUCH VERIO FLEX SYSTEM) w/Device KIT USE TO TEST DAILY.     Lancets (ONETOUCH DELICA PLUS LANCET33G) MISC Inject 1 each into the skin daily. 100 each 0   ONETOUCH VERIO test strip 1 each by Other route as needed for other. 100 each 4   No current facility-administered medications on file prior to visit.    The following portions of the patient's history were reviewed and updated as appropriate: allergies, current medications, past family history, past medical history, past social history, past surgical history and problem list.  ROS Otherwise as in subjective above     Objective: BP 122/72   Pulse 66   Wt 202 lb 6.4 oz (91.8 kg)   LMP 04/04/2012 Comment: not sexually active  BMI 37.02 kg/m   BP Readings from Last 3 Encounters:  12/21/22 122/72  11/30/22 112/70  08/10/22 118/64   Wt Readings from Last 3 Encounters:  12/21/22 202 lb 6.4  oz (91.8 kg)  11/30/22 205 lb (93 kg)  08/10/22 208 lb 12.8 oz (94.7 kg)    General appearance: alert, no distress, well developed, well nourished Heart: RRR, normal S1, S2, no murmurs Lungs: CTA bilaterally, no wheezes, rhonchi, or rales Pulses: 2+ radial pulses, 2+ pedal pulses, normal cap refill Ext: no edema    Assessment: Encounter Diagnoses  Name Primary?   Type 2 diabetes mellitus with hyperglycemia, without long-term current use of insulin (HCC) Yes   Essential hypertension    Hyperlipidemia, unspecified hyperlipidemia type    Influenza vaccination declined    Hypotension due to drugs    Chronic low back pain, unspecified back pain laterality, unspecified whether sciatica present    Encounter for screening  mammogram for malignant neoplasm of breast      Plan: Diabetes-updated labs today Continue metformin 500 mg twice daily Continue to monitor your blood sugars regularly See your eye doctor yearly for diabetic eye exam and make sure they send [place] a copy of the record. I am showing that you are past due on the eye exam   Hypertension Continue Amlodipine 10 mg daily Discontinue Hydrochlorothiazide 12.5 mg daily as you are having low readings Increase your water intake to about 100 ounces per day   Hyperlipidemia Continue Crestor 5 mg daily Updated labs today   Continue vitamin D supplement   Chronic low back pain Begin back on Gabapentin 300mg  daily at bedtime You can use Ultram periodically for worse pain, but I can't prescribed for daily use.  This is a narcotic.   Screen for breast cancer: Please call to schedule your mammogram.   [facility]  [phone] [address] [place], [place] [address]    Amy [name] was seen today for med check.  Diagnoses and all orders for this visit:  Type 2 diabetes mellitus with hyperglycemia, without long-term current use of insulin (HCC) -     Hemoglobin A1c  Essential hypertension  Hyperlipidemia, unspecified hyperlipidemia type -     Comprehensive metabolic panel -     Lipid panel  Influenza vaccination declined  Hypotension due to drugs  Chronic low back pain, unspecified back pain laterality, unspecified whether sciatica present  Encounter for screening mammogram for malignant neoplasm of breast -     MM 3D SCREENING MAMMOGRAM BILATERAL BREAST  Other orders -     gabapentin (NEURONTIN) 300 MG capsule; Take 1 capsule (300 mg total) by mouth at bedtime. -     traMADol (ULTRAM) 50 MG tablet; Take 1 tablet (50 mg total) by mouth daily as needed for up to 5 days.    Follow up: pending labs

## 2022-12-22 ENCOUNTER — Other Ambulatory Visit: Payer: Self-pay | Admitting: Medical

## 2022-12-22 ENCOUNTER — Encounter: Payer: Self-pay | Admitting: Internal Medicine

## 2022-12-22 LAB — COMPREHENSIVE METABOLIC PANEL
ALT: 22 IU/L (ref 0–32)
AST: 16 IU/L (ref 0–40)
Albumin: 4.5 g/dL (ref 3.9–4.9)
Alkaline Phosphatase: 74 IU/L (ref 44–121)
BUN/Creatinine Ratio: 14 (ref 12–28)
BUN: 12 mg/dL (ref 8–27)
Bilirubin Total: 0.4 mg/dL (ref 0.0–1.2)
CO2: 22 mmol/L (ref 20–29)
Calcium: 9.9 mg/dL (ref 8.7–10.3)
Chloride: 105 mmol/L (ref 96–106)
Creatinine, Ser: 0.88 mg/dL (ref 0.57–1.00)
Globulin, Total: 2.4 g/dL (ref 1.5–4.5)
Glucose: 98 mg/dL (ref 70–99)
Potassium: 4.4 mmol/L (ref 3.5–5.2)
Sodium: 143 mmol/L (ref 134–144)
Total Protein: 6.9 g/dL (ref 6.0–8.5)
eGFR: 75 mL/min/{1.73_m2} (ref >59)

## 2022-12-22 LAB — LIPID PANEL
Chol/HDL Ratio: 3.1 ratio (ref 0.0–4.4)
Cholesterol, Total: 138 mg/dL (ref 100–199)
HDL: 44 mg/dL (ref >39)
LDL Chol Calc (NIH): 76 mg/dL (ref 0–99)
Triglycerides: 94 mg/dL (ref 0–149)
VLDL Cholesterol Cal: 18 mg/dL (ref 5–40)

## 2022-12-22 LAB — HEMOGLOBIN A1C
Est. average glucose Bld gHb Est-mCnc: 154 mg/dL
Hgb A1c MFr Bld: 7 % — ABNORMAL HIGH (ref 4.8–5.6)

## 2022-12-22 MED ORDER — ROSUVASTATIN CALCIUM 5 MG PO TABS: 5.0000 mg | ORAL_TABLET | Freq: Every day | ORAL | 3 refills | Status: DC

## 2022-12-22 NOTE — Progress Notes (Signed)
Schedule 3 month follow-up Results sent through My Chart

## 2023-01-23 ENCOUNTER — Telehealth: Payer: Self-pay | Admitting: Internal Medicine

## 2023-01-23 DIAGNOSIS — E1165 Type 2 diabetes mellitus with hyperglycemia: Secondary | ICD-10-CM

## 2023-01-23 MED ORDER — ONETOUCH VERIO FLEX SYSTEM W/DEVICE KIT
PACK | 0 refills | Status: DC
Start: 2023-01-23 — End: 2023-01-26

## 2023-01-23 NOTE — Telephone Encounter (Signed)
Pt needed  a rx sent to pharmacy for one touch meter

## 2023-01-25 ENCOUNTER — Other Ambulatory Visit: Payer: Self-pay | Admitting: Medical

## 2023-01-25 DIAGNOSIS — R7309 Other abnormal glucose: Secondary | ICD-10-CM

## 2023-01-26 ENCOUNTER — Telehealth: Payer: Self-pay | Admitting: Medical

## 2023-01-26 DIAGNOSIS — R7309 Other abnormal glucose: Secondary | ICD-10-CM

## 2023-01-26 DIAGNOSIS — E1165 Type 2 diabetes mellitus with hyperglycemia: Secondary | ICD-10-CM

## 2023-01-26 MED ORDER — ONETOUCH DELICA PLUS LANCET33G MISC: 1.0000 | Freq: Every day | 1 refills | Status: DC

## 2023-01-26 MED ORDER — ONETOUCH VERIO FLEX SYSTEM W/DEVICE KIT: PACK | 0 refills | Status: DC

## 2023-01-26 MED ORDER — ONETOUCH VERIO VI STRP: 1.0000 | ORAL_STRIP | 1 refills | Status: DC | PRN

## 2023-01-26 NOTE — Telephone Encounter (Signed)
Pt came by office & [facility] One Touch Delica plus device & One Touch Verio Flex device & pt would like both sent to [facility]

## 2023-01-26 NOTE — Telephone Encounter (Signed)
Sent device and supplies order to [facility]

## 2023-01-27 DIAGNOSIS — H401132 Primary open-angle glaucoma, bilateral, moderate stage: Secondary | ICD-10-CM | POA: Diagnosis not present

## 2023-01-27 DIAGNOSIS — H04123 Dry eye syndrome of bilateral lacrimal glands: Secondary | ICD-10-CM | POA: Diagnosis not present

## 2023-01-27 DIAGNOSIS — H43813 Vitreous degeneration, bilateral: Secondary | ICD-10-CM | POA: Diagnosis not present

## 2023-01-27 DIAGNOSIS — H402232 Chronic angle-closure glaucoma, bilateral, moderate stage: Secondary | ICD-10-CM | POA: Diagnosis not present

## 2023-01-29 ENCOUNTER — Other Ambulatory Visit: Payer: Self-pay | Admitting: Medical

## 2023-01-29 DIAGNOSIS — I10 Essential (primary) hypertension: Secondary | ICD-10-CM

## 2023-01-30 ENCOUNTER — Telehealth: Payer: Self-pay

## 2023-01-30 NOTE — Telephone Encounter (Signed)
Refilled this.   However her follow-up appt in December is scheduled with [facility] not [facility]. This needs to be switched to [facility]

## 2023-01-30 NOTE — Telephone Encounter (Signed)
Faxed request for amlodipine

## 2023-02-15 DIAGNOSIS — H43813 Vitreous degeneration, bilateral: Secondary | ICD-10-CM | POA: Diagnosis not present

## 2023-02-15 DIAGNOSIS — H401132 Primary open-angle glaucoma, bilateral, moderate stage: Secondary | ICD-10-CM | POA: Diagnosis not present

## 2023-02-22 ENCOUNTER — Telehealth: Payer: Self-pay | Admitting: Orthopedic Surgery

## 2023-02-22 NOTE — Telephone Encounter (Signed)
IC,lmvm advised copy of records are ready to pickup at front desk.

## 2023-02-25 ENCOUNTER — Other Ambulatory Visit: Payer: Self-pay | Admitting: Medical

## 2023-03-01 DIAGNOSIS — J0110 Acute frontal sinusitis, unspecified: Secondary | ICD-10-CM | POA: Diagnosis not present

## 2023-03-03 ENCOUNTER — Telehealth: Payer: Self-pay | Admitting: Medical

## 2023-03-03 NOTE — Telephone Encounter (Signed)
P.A. TRAMADOL Key: [id number] PA Case ID #: [id number] Rx #: [id number] Need Help? Call us at [phone] Outcome Approved today by [facility] NCPDP 2017 Your PA request has been approved. Additional information will be provided in the approval communication. (Message 1145) Authorization Expiration Date: 08/29/2023 Drug traMADol HCl 50MG  tablets ePA cloud logo Form [employer] Electronic SENT Mychart message

## 2023-03-21 ENCOUNTER — Ambulatory Visit (INDEPENDENT_AMBULATORY_CARE_PROVIDER_SITE_OTHER): Payer: 59 | Admitting: Medical

## 2023-03-21 VITALS — BP 114/72 | HR 70 | Wt 203.8 lb

## 2023-03-21 DIAGNOSIS — R058 Other specified cough: Secondary | ICD-10-CM

## 2023-03-21 DIAGNOSIS — I10 Essential (primary) hypertension: Secondary | ICD-10-CM | POA: Diagnosis not present

## 2023-03-21 DIAGNOSIS — E1165 Type 2 diabetes mellitus with hyperglycemia: Secondary | ICD-10-CM

## 2023-03-21 DIAGNOSIS — T466X5A Adverse effect of antihyperlipidemic and antiarteriosclerotic drugs, initial encounter: Secondary | ICD-10-CM | POA: Diagnosis not present

## 2023-03-21 DIAGNOSIS — E559 Vitamin D deficiency, unspecified: Secondary | ICD-10-CM | POA: Diagnosis not present

## 2023-03-21 DIAGNOSIS — M609 Myositis, unspecified: Secondary | ICD-10-CM

## 2023-03-21 DIAGNOSIS — R059 Cough, unspecified: Secondary | ICD-10-CM

## 2023-03-21 DIAGNOSIS — Z1231 Encounter for screening mammogram for malignant neoplasm of breast: Secondary | ICD-10-CM | POA: Diagnosis not present

## 2023-03-21 DIAGNOSIS — E785 Hyperlipidemia, unspecified: Secondary | ICD-10-CM | POA: Diagnosis not present

## 2023-03-21 DIAGNOSIS — R7309 Other abnormal glucose: Secondary | ICD-10-CM

## 2023-03-21 MED ORDER — AZITHROMYCIN 500 MG PO TABS
500.0000 mg | ORAL_TABLET | Freq: Every day | ORAL | 0 refills | Status: DC
Start: 2023-03-21 — End: 2023-04-11

## 2023-03-21 MED ORDER — HYDROCHLOROTHIAZIDE 12.5 MG PO TABS: 12.5000 mg | ORAL_TABLET | Freq: Every day | ORAL | 3 refills | Status: DC

## 2023-03-21 MED ORDER — METFORMIN HCL 500 MG PO TABS: 500.0000 mg | ORAL_TABLET | Freq: Two times a day (BID) | ORAL | 2 refills | Status: DC

## 2023-03-21 MED ORDER — HYDROCOD POLI-CHLORPHE POLI ER 10-8 MG/5ML PO SUER: 5.0000 mL | Freq: Two times a day (BID) | ORAL | 0 refills | Status: DC

## 2023-03-21 MED ORDER — VITAMIN D3 50 MCG (2000 UT) PO CAPS: 2000.0000 [IU] | ORAL_CAPSULE | Freq: Every day | ORAL | 1 refills | Status: DC

## 2023-03-21 MED ORDER — AMLODIPINE BESYLATE 10 MG PO TABS: 10.0000 mg | ORAL_TABLET | Freq: Every day | ORAL | 3 refills | Status: DC

## 2023-03-21 NOTE — Patient Instructions (Signed)
Please call to schedule your mammogram.   [facility]  [phone] [address], [address] [place], [place] [address]  

## 2023-03-21 NOTE — Progress Notes (Signed)
Subjective:  Amy [name] is a 62 y.o. female who presents for Chief Complaint  Patient presents with   Medical Management of Chronic Issues    Med check. Last A1c was 12/21/22 at 7%     Here for med check.  Diabetes-compliant with metformin 500 mg twice daily.  She read online where metformin could potentially harm the kidneys.  She has questions about this.  She is checking her blood sugars at home.  Readings over the last month and a half range from 110-157 fasting but most are less than 135  Hypertension-compliant with amlodipine 10 mg daily and hydrochlorothiazide 12.5 mg daily.  Hyperlipidemia-compliant with Crestor 5 mg daily.  She has had prior issues with statins causing myopathy.  She has not willing to go up on this dose  She needs a refill on her vitamin D supplement  She declines flu shot  She was seen by urgent care 3 weeks ago for cough and congestion given doxycycline and promethazine cough syrup.  Improved but not all the way resolved.  Would like another round of something that will clear this up.  She has no fever body aches or chills but does have ongoing congestion and cough.  She has been using some Mucinex but that does not seem to help  No other aggravating or relieving factors.    No other c/o.  Past Medical History:  Diagnosis Date   Abnormal Pap smear of cervix    Endometrial polyp 2016   [facility]   GERD (gastroesophageal reflux disease)    Glaucoma    Hyperlipidemia    Hypertension    Leiomyoma of uterus 04/29/2019   Prediabetes    Thickened endometrium 2016   per [facility] records   Vitamin D deficiency 01/15/2018   Current Outpatient Medications on File Prior to Visit  Medication Sig Dispense Refill   dorzolamide-timolol (COSOPT) 22.3-6.8 MG/ML ophthalmic solution 1 drop 2 (two) times daily.     EYSUVIS 0.25 % SUSP Apply 1 drop to eye 2 (two) times daily.     gabapentin (NEURONTIN) 300 MG capsule Take 1 capsule (300 mg total) by  mouth at bedtime. 90 capsule 1   LUMIGAN 0.01 % SOLN      Multiple Vitamin (MULTIVITAMIN) tablet Take 1 tablet by mouth daily.     rosuvastatin (CRESTOR) 5 MG tablet Take 1 tablet (5 mg total) by mouth daily. 90 tablet 3   Blood Glucose Monitoring Suppl (ONETOUCH VERIO FLEX SYSTEM) w/Device KIT Test daily 1 kit 0   Lancets (ONETOUCH DELICA PLUS LANCET33G) MISC Inject 1 each into the skin daily. 100 each 1   ONETOUCH VERIO test strip 1 each by Other route as needed for other. 100 strip 1   No current facility-administered medications on file prior to visit.     The following portions of the patient's history were reviewed and updated as appropriate: allergies, current medications, past family history, past medical history, past social history, past surgical history and problem list.  ROS Otherwise as in subjective above    Objective: BP 114/72   Pulse 70   Wt 203 lb 12.8 oz (92.4 kg)   LMP 04/04/2012 Comment: not sexually active  BMI 37.28 kg/m   General appearance: alert, no distress, well developed, well nourished HEENT: normocephalic, sclerae anicteric, conjunctiva pink and moist, TMs flat, nares patent, no discharge or erythema, pharynx normal Oral cavity: MMM, no lesions Neck: supple, no lymphadenopathy, no thyromegaly, no masses Heart: RRR, normal S1,  S2, no murmurs Lungs: coarse sounds, no wheezes, rhonchi, or rales Pulses: 2+ radial pulses, 2+ pedal pulses, normal cap refill Ext: no edema  Diabetic Foot Exam - Simple   Simple Foot Form Diabetic Foot exam was performed with the following findings: Yes 03/21/2023  2:51 PM  Visual Inspection No deformities, no ulcerations, no other skin breakdown bilaterally: Yes Sensation Testing Intact to touch and monofilament testing bilaterally: Yes Pulse Check Posterior Tibialis and Dorsalis pulse intact bilaterally: Yes Comments      Assessment: Encounter Diagnoses  Name Primary?   Type 2 diabetes mellitus with  hyperglycemia, without long-term current use of insulin (HCC) Yes   Hyperlipidemia, unspecified hyperlipidemia type    Vitamin D deficiency    Statin-induced myositis    Essential hypertension    Cough, unspecified type    Screening mammogram for breast cancer    Productive cough    Elevated hemoglobin A1c      Plan: Diabetes-reassured her about metformin.  Discussed potential risk and benefits.  Updated labs today.  Home readings look pretty good.  Hyperlipidemia-I recommend we go up on the dose from 5 mg to 10 but she declines.  Continue rosuvastatin 5 mg daily  Hypertension-continue amlodipine 10 mg daily and hydrochlorothiazide 12.5 mg daily  Vitamin D deficiency-continue supplement  Advise she go ahead and get on the schedule for mammogram  Productive cough-medications as below, Tussionex and Z-Pak.  She requested/next as this is worked great in the past.  Hydrate well.   Amy [name] was seen today for medical management of chronic issues.  Diagnoses and all orders for this visit:  Type 2 diabetes mellitus with hyperglycemia, without long-term current use of insulin (HCC) -     Hemoglobin A1c -     CBC -     Comprehensive metabolic panel  Hyperlipidemia, unspecified hyperlipidemia type -     Comprehensive metabolic panel  Vitamin D deficiency  Statin-induced myositis -     CBC -     Comprehensive metabolic panel  Essential hypertension -     CBC -     Comprehensive metabolic panel -     amLODipine (NORVASC) 10 MG tablet; Take 1 tablet (10 mg total) by mouth daily.  Cough, unspecified type  Screening mammogram for breast cancer  Productive cough  Elevated hemoglobin A1c -     metFORMIN (GLUCOPHAGE) 500 MG tablet; Take 1 tablet (500 mg total) by mouth 2 (two) times daily with a meal.  Other orders -     azithromycin (ZITHROMAX) 500 MG tablet; Take 1 tablet (500 mg total) by mouth daily. -     chlorpheniramine-HYDROcodone (TUSSIONEX) 10-8 MG/5ML; Take 5 mLs by  mouth 2 (two) times daily. -     hydrochlorothiazide (HYDRODIURIL) 12.5 MG tablet; Take 1 tablet (12.5 mg total) by mouth daily. -     Cholecalciferol (VITAMIN D3) 50 MCG (2000 UT) capsule; Take 1 capsule (2,000 Units total) by mouth daily.    Follow up: pending labs

## 2023-03-22 ENCOUNTER — Encounter: Payer: 59 | Admitting: Family Medicine

## 2023-03-22 LAB — COMPREHENSIVE METABOLIC PANEL
ALT: 24 IU/L (ref 0–32)
AST: 18 IU/L (ref 0–40)
Albumin: 4.5 g/dL (ref 3.9–4.9)
Alkaline Phosphatase: 77 IU/L (ref 44–121)
BUN/Creatinine Ratio: 10 — ABNORMAL LOW (ref 12–28)
BUN: 9 mg/dL (ref 8–27)
Bilirubin Total: 0.5 mg/dL (ref 0.0–1.2)
CO2: 24 mmol/L (ref 20–29)
Calcium: 9.9 mg/dL (ref 8.7–10.3)
Chloride: 107 mmol/L — ABNORMAL HIGH (ref 96–106)
Creatinine, Ser: 0.9 mg/dL (ref 0.57–1.00)
Globulin, Total: 2.8 g/dL (ref 1.5–4.5)
Glucose: 93 mg/dL (ref 70–99)
Potassium: 3.9 mmol/L (ref 3.5–5.2)
Sodium: 145 mmol/L — ABNORMAL HIGH (ref 134–144)
Total Protein: 7.3 g/dL (ref 6.0–8.5)
eGFR: 73 mL/min/{1.73_m2} (ref >59)

## 2023-03-22 LAB — CBC
Hematocrit: 42.4 % (ref 34.0–46.6)
Hemoglobin: 14 g/dL (ref 11.1–15.9)
MCH: 30.3 pg (ref 26.6–33.0)
MCHC: 33 g/dL (ref 31.5–35.7)
MCV: 92 fL (ref 79–97)
Platelets: 339 10*3/uL (ref 150–450)
RBC: 4.62 x10E6/uL (ref 3.77–5.28)
RDW: 12.7 % (ref 11.7–15.4)
WBC: 3.8 10*3/uL (ref 3.4–10.8)

## 2023-03-22 LAB — HEMOGLOBIN A1C
Est. average glucose Bld gHb Est-mCnc: 140 mg/dL
Hgb A1c MFr Bld: 6.5 % — ABNORMAL HIGH (ref 4.8–5.6)

## 2023-03-23 ENCOUNTER — Other Ambulatory Visit: Payer: Self-pay | Admitting: Medical

## 2023-03-23 DIAGNOSIS — R7309 Other abnormal glucose: Secondary | ICD-10-CM

## 2023-03-23 NOTE — Progress Notes (Signed)
Results sent through MyChart

## 2023-04-11 ENCOUNTER — Ambulatory Visit: Payer: Medicaid Other | Admitting: Family Medicine

## 2023-04-11 ENCOUNTER — Encounter: Payer: Self-pay | Admitting: Family Medicine

## 2023-04-11 VITALS — BP 114/68 | HR 58 | Temp 97.9°F | Wt 209.2 lb

## 2023-04-11 DIAGNOSIS — R058 Other specified cough: Secondary | ICD-10-CM | POA: Diagnosis not present

## 2023-04-11 MED ORDER — AZITHROMYCIN 500 MG PO TABS: 500.0000 mg | ORAL_TABLET | Freq: Every day | ORAL | 0 refills | Status: DC

## 2023-04-11 MED ORDER — HYDROCOD POLI-CHLORPHE POLI ER 10-8 MG/5ML PO SUER: 5.0000 mL | Freq: Two times a day (BID) | ORAL | 0 refills | Status: DC

## 2023-04-11 NOTE — Progress Notes (Signed)
   Subjective:    Patient ID: Amy [name], female    DOB: 1961/01/25, 63 y.o.   MRN: [id number]  HPI She was seen on December 17 and treated for a productive cough with azithromycin  and Tussionex.  She states that she is roughly 50% better but still having difficulty with cough, congestion but no fever, chills, sore throat or earache.   Review of Systems     Objective:    Physical Exam Alert and in no distress. Tympanic membranes and canals are normal. Pharyngeal area is normal. Neck is supple without adenopathy or thyromegaly. Cardiac exam shows a regular sinus rhythm without murmurs or gallops. Lungs are clear to auscultation.        Assessment & Plan:  Productive cough - Plan: chlorpheniramine-HYDROcodone (TUSSIONEX) 10-8 MG/5ML, DISCONTINUED: azithromycin  (ZITHROMAX ) 500 MG tablet Take the Z-Pak as prescribed and I will give you a refill but you need to wait a week before you take it if you are not totally back to normal

## 2023-04-11 NOTE — Patient Instructions (Signed)
 Take the Z-Pak as prescribed and I will give you a refill but you need to wait a week before you take it if you are not totally back to normal

## 2023-04-25 ENCOUNTER — Telehealth: Payer: Self-pay | Admitting: Medical

## 2023-04-25 MED ORDER — LANCET DEVICE MISC: 0 refills | Status: AC

## 2023-04-25 MED ORDER — LANCETS MISC. MISC: 0 refills | Status: AC

## 2023-04-25 MED ORDER — BLOOD GLUCOSE TEST VI STRP: ORAL_STRIP | 0 refills | Status: DC

## 2023-04-25 MED ORDER — BLOOD GLUCOSE MONITORING SUPPL DEVI: 0 refills | Status: AC

## 2023-04-25 NOTE — Telephone Encounter (Signed)
Pt came by and states her insurance no longer will pay for her current blood glucose monitor supplies so she needs to change to accuchek monitor because her insurance will cover that. She uses  [id number] - [place], [place] - [address]

## 2023-04-25 NOTE — Telephone Encounter (Signed)
sent 

## 2023-06-06 ENCOUNTER — Ambulatory Visit: Admitting: Radiology

## 2023-06-07 ENCOUNTER — Ambulatory Visit: Admitting: Radiology

## 2023-06-07 VITALS — BP 116/72 | HR 72 | Wt 200.6 lb

## 2023-06-07 DIAGNOSIS — N952 Postmenopausal atrophic vaginitis: Secondary | ICD-10-CM | POA: Diagnosis not present

## 2023-06-07 DIAGNOSIS — L292 Pruritus vulvae: Secondary | ICD-10-CM | POA: Diagnosis not present

## 2023-06-07 LAB — WET PREP FOR TRICH, YEAST, CLUE

## 2023-06-07 MED ORDER — PREMARIN 0.625 MG/GM VA CREA: 1.0000 g | TOPICAL_CREAM | VAGINAL | 12 refills | Status: DC

## 2023-06-07 NOTE — Progress Notes (Signed)
      Subjective: Amy [name] is a 63 y.o. female who complains of severe vaginal itching, burning, discharge, odor. Symptoms began 2 weeks ago.     Review of Systems  All other systems reviewed and are negative.   Past Medical History:  Diagnosis Date   Abnormal Pap smear of cervix    Endometrial polyp 2016   [facility]   GERD (gastroesophageal reflux disease)    Glaucoma    Hyperlipidemia    Hypertension    Leiomyoma of uterus 04/29/2019   Prediabetes    Thickened endometrium 2016   per [facility] records   Vitamin D deficiency 01/15/2018      Objective:  Today's Vitals   06/07/23 1406  BP: 116/72  Pulse: 72  SpO2: 93%  Weight: 200 lb 9.6 oz (91 kg)   Body mass index is 36.69 kg/m.   Physical Exam Vitals and nursing note reviewed. Exam conducted with a chaperone present.  Constitutional:      Appearance: Normal appearance. She is well-developed.  Pulmonary:     Effort: Pulmonary effort is normal.  Abdominal:     General: Abdomen is flat.     Palpations: Abdomen is soft.  Genitourinary:    General: Normal vulva.     Vagina: Vaginal discharge present. No erythema, bleeding or lesions.     Cervix: Normal. No discharge, friability, lesion or erythema.     Uterus: Normal.      Adnexa: Right adnexa normal and left adnexa normal.  Neurological:     Mental Status: She is alert.  Psychiatric:        Mood and Affect: Mood normal.        Thought Content: Thought content normal.        Judgment: Judgment normal.      Microscopic wet-mount exam shows negative for pathogens, normal epithelial cells.   Raynelle Fanning, CMA present for exam  Assessment:/Plan:  1. Vulvovaginal itching (Primary) - WET PREP FOR TRICH, YEAST, CLUE - conjugated estrogens (PREMARIN) vaginal cream; Place 0.5 Applicatorfuls vaginally 3 (three) times a week.  Dispense: 42.5 g; Refill: 12  2. Vaginal atrophy - conjugated estrogens (PREMARIN) vaginal cream; Place 0.5  Applicatorfuls vaginally 3 (three) times a week.  Dispense: 42.5 g; Refill: 12     Avoid intercourse until symptoms are resolved. Safe sex encouraged. Avoid the use of soaps or perfumed products in the peri area. Avoid tub baths and sitting in sweaty or wet clothing for prolonged periods of time.     Samira Acero B, NP 2:18 PM

## 2023-06-13 ENCOUNTER — Telehealth: Payer: Self-pay | Admitting: Medical

## 2023-06-13 NOTE — Telephone Encounter (Signed)
 Pt stopped by and is requesting the  antimalarial medicine she is going out of town 07/21/2023 to 08/09/2023., she has a appt on 07/04/2023, but she knows she needs this way before she leaves  Pt can be reached at [phone]  Please send back to someone else as I will not be back

## 2023-06-20 ENCOUNTER — Ambulatory Visit: Admitting: Medical

## 2023-06-20 VITALS — BP 120/80 | HR 66 | Wt 200.4 lb

## 2023-06-20 DIAGNOSIS — Z2989 Encounter for other specified prophylactic measures: Secondary | ICD-10-CM | POA: Diagnosis not present

## 2023-06-20 DIAGNOSIS — E1165 Type 2 diabetes mellitus with hyperglycemia: Secondary | ICD-10-CM

## 2023-06-20 DIAGNOSIS — Z7184 Encounter for health counseling related to travel: Secondary | ICD-10-CM

## 2023-06-20 DIAGNOSIS — I10 Essential (primary) hypertension: Secondary | ICD-10-CM

## 2023-06-20 MED ORDER — ONDANSETRON 4 MG PO TBDP: 4.0000 mg | ORAL_TABLET | Freq: Three times a day (TID) | ORAL | 0 refills | Status: DC | PRN

## 2023-06-20 MED ORDER — MEFLOQUINE HCL 250 MG PO TABS: 250.0000 mg | ORAL_TABLET | ORAL | 0 refills | Status: DC

## 2023-06-20 MED ORDER — CIPROFLOXACIN HCL 500 MG PO TABS: 500.0000 mg | ORAL_TABLET | Freq: Two times a day (BID) | ORAL | 0 refills | Status: AC

## 2023-06-20 MED ORDER — DICYCLOMINE HCL 10 MG PO CAPS: 10.0000 mg | ORAL_CAPSULE | Freq: Three times a day (TID) | ORAL | 0 refills | Status: DC | PRN

## 2023-06-20 NOTE — Patient Instructions (Addendum)
 Recommendations for travel medications: Begin mefloquine antimalarial prophylaxis 1 tablet weekly.  Start this 3 weeks before your trip starting next Monday on June 26, 2023.  Take this weekly while you are on your trip and take this for 4 additional weeks once you return back here in the [place]. Take the mefloquine with food I also prescribed 3 other medicines you can take with you I prescribed Zofran/ondansetron that you can use as needed every 8 hours for nausea and vomiting I prescribed dicyclomine/Bentyl which you can use up to 3 times a day for diarrhea and spasm of the abdomen, stomach pain.  However if you have bloody diarrhea and fever do not use this. I prescribed Cipro antibiotic.  If you were to get a urinary tract infection such as burning with urination, urinary frequency, odor in urine, blood in the urine you can use the Cipro antibiotic.  Or if you get traveler's diarrhea such as nausea and vomiting and multiple diarrhea episodes a day, the you can take the Cipro in that case as well.  You would do Cipro antibiotic twice a day for 3 to 5 days If you have high fever and bloody stools then get checked out by a local doctor

## 2023-06-20 NOTE — Progress Notes (Signed)
 Subjective:  Amy [name] is a 63 y.o. female who presents for Chief Complaint  Patient presents with   Consult    Going to [place] next month and needs antimalarial pill     Here for questions about travel, travel advice.  She is headed to [place] July 17, 2023 through Aug 08, 2023.  She needs malaria prophylaxis medication.  She has used mefloquine in the past and done well on this.  She did not do well on chloroquine in the past.  She has been back and forth to [place] several times over the last several years.    Otherwise is doing well.  She has a copy of her prior yellow fever vaccine from 2018 with her.  She is compliant with her other medications for blood pressure and diabetes and cholesterol.  No other aggravating or relieving factors.    No other c/o.  Past Medical History:  Diagnosis Date   Abnormal Pap smear of cervix    Endometrial polyp 2016   [facility]   GERD (gastroesophageal reflux disease)    Glaucoma    Hyperlipidemia    Hypertension    Leiomyoma of uterus 04/29/2019   Prediabetes    Thickened endometrium 2016   per [facility] records   Vitamin D deficiency 01/15/2018   Current Outpatient Medications on File Prior to Visit  Medication Sig Dispense Refill   amLODipine (NORVASC) 10 MG tablet Take 1 tablet (10 mg total) by mouth daily. 90 tablet 3   Cholecalciferol (VITAMIN D3) 50 MCG (2000 UT) capsule Take 1 capsule (2,000 Units total) by mouth daily. 90 capsule 1   conjugated estrogens (PREMARIN) vaginal cream Place 0.5 Applicatorfuls vaginally 3 (three) times a week. 42.5 g 12   dorzolamide-timolol (COSOPT) 22.3-6.8 MG/ML ophthalmic solution 1 drop 2 (two) times daily.     EYSUVIS 0.25 % SUSP Apply 1 drop to eye 2 (two) times daily.     gabapentin (NEURONTIN) 300 MG capsule Take 1 capsule (300 mg total) by mouth at bedtime. 90 capsule 1   hydrochlorothiazide (HYDRODIURIL) 12.5 MG tablet Take 1 tablet (12.5 mg total) by mouth daily. 90 tablet 3    LUMIGAN 0.01 % SOLN      metFORMIN (GLUCOPHAGE) 500 MG tablet Take 1 tablet (500 mg total) by mouth 2 (two) times daily with a meal. 180 tablet 2   Multiple Vitamin (MULTIVITAMIN) tablet Take 1 tablet by mouth daily.     rosuvastatin (CRESTOR) 5 MG tablet Take 1 tablet (5 mg total) by mouth daily. 90 tablet 3   Blood Glucose Monitoring Suppl DEVI Test 1-2 times day. Needs accu-chek 1 each 0   Glucose Blood (BLOOD GLUCOSE TEST STRIPS) STRP Test 1-2 times daily. Needs accu-chek meter 100 strip 0   Lancet Device MISC 1-2 times daily 1 each 0   Lancets Misc. MISC 1-2 times a day 100 each 0   No current facility-administered medications on file prior to visit.     The following portions of the patient's history were reviewed and updated as appropriate: allergies, current medications, past family history, past medical history, past social history, past surgical history and problem list.  ROS Otherwise as in subjective above  Objective: BP 120/80   Pulse 66   Wt 200 lb 6.4 oz (90.9 kg)   LMP 04/04/2012 Comment: not sexually active  SpO2 97%   BMI 36.65 kg/m   General appearance: alert, no distress, well developed, well nourished Psych: Pleasant, answers questions appropriately  Assessment: Encounter Diagnoses  Name Primary?   Type 2 diabetes mellitus with hyperglycemia, without long-term current use of insulin (HCC) Yes   Need for malaria prophylaxis    Travel advice encounter    Essential hypertension      Plan: We discussed her travel questions.  We discussed CDC recommendations for prophylaxis and vaccines.  She does not have all of her vaccine records with her but she notes being up-to-date on most of her childhood vaccines.  She declines any other vaccines today.  Continue routine medications for her chronic issues including diabetes and high blood pressure and high cholesterol  We discussed the following recommendations  Recommendations for travel  medications: Begin mefloquine antimalarial prophylaxis 1 tablet weekly.  Start this 3 weeks before your trip starting next Monday on June 26, 2023.  Take this weekly while you are on your trip and take this for 4 additional weeks once you return back here in the [place]. Take the mefloquine with food I also prescribed 3 other medicines you can take with you I prescribed Zofran/ondansetron that you can use as needed every 8 hours for nausea and vomiting I prescribed dicyclomine/Bentyl which you can use up to 3 times a day for diarrhea and spasm of the abdomen, stomach pain.  However if you have bloody diarrhea and fever do not use this. I prescribed Cipro antibiotic.  If you were to get a urinary tract infection such as burning with urination, urinary frequency, odor in urine, blood in the urine you can use the Cipro antibiotic.  Or if you get traveler's diarrhea such as nausea and vomiting and multiple diarrhea episodes a day, the you can take the Cipro in that case as well.  You would do Cipro antibiotic twice a day for 3 to 5 days If you have high fever and bloody stools then get checked out by a local doctor  Amy [name] was seen today for consult.  Diagnoses and all orders for this visit:  Type 2 diabetes mellitus with hyperglycemia, without long-term current use of insulin (HCC)  Need for malaria prophylaxis  Travel advice encounter  Essential hypertension  Other orders -     mefloquine (LARIAM) 250 MG tablet; Take 1 tablet (250 mg total) by mouth every 7 (seven) days. Once weekly starting 3 weeks before trip, weekly during trip, and for 4 weeks after returning to the [place] -     ondansetron (ZOFRAN-ODT) 4 MG disintegrating tablet; Take 1 tablet (4 mg total) by mouth every 8 (eight) hours as needed for nausea or vomiting. -     ciprofloxacin (CIPRO) 500 MG tablet; Take 1 tablet (500 mg total) by mouth 2 (two) times daily for 5 days. -     dicyclomine (BENTYL) 10 MG capsule; Take 1 capsule (10 mg  total) by mouth 3 (three) times daily as needed for spasms.    Follow up: 23mo

## 2023-06-22 ENCOUNTER — Other Ambulatory Visit: Payer: Self-pay

## 2023-06-22 MED ORDER — MEFLOQUINE HCL 250 MG PO TABS: 250.0000 mg | ORAL_TABLET | ORAL | 0 refills | Status: DC

## 2023-06-23 ENCOUNTER — Telehealth: Payer: Self-pay | Admitting: Internal Medicine

## 2023-06-23 NOTE — Telephone Encounter (Signed)
 mefloquine (LARIAM) 250 MG tablet is not available [facility] due to [facility].   I have called and left VM for pt to find out if they have it at another pharmacy- [facility], [facility], [facility], etc and then we could send it there.    Martie Lee

## 2023-06-27 ENCOUNTER — Telehealth: Payer: Self-pay | Admitting: Medical

## 2023-06-27 NOTE — Telephone Encounter (Unsigned)
 Copied from CRM [id number]. Topic: Clinical - Prescription Issue >> Jun 27, 2023 11:51 AM Antwanette L wrote: Reason for CRM: Arlys John from [facility] ([address] [facility]) is calling in because they received an order on 06/22/23 for mefloquine (LARIAM) 250 MG tablet, but they do not have it in stock. Arlys John said a lot of manufacturers is showing the medicine as discontinued. Arlys John is suggesting the provider change the order to Atovoquone.   Pharmacy Details [id number] [place] ([place]) [place] [address] Phone: [phone] Fax: [phone]

## 2023-06-28 ENCOUNTER — Other Ambulatory Visit: Payer: Self-pay | Admitting: Medical

## 2023-06-28 MED ORDER — ATOVAQUONE-PROGUANIL HCL 250-100 MG PO TABS: 1.0000 | ORAL_TABLET | Freq: Every day | ORAL | 0 refills | Status: DC

## 2023-06-28 NOTE — Telephone Encounter (Signed)
 Pt was notified.

## 2023-07-04 ENCOUNTER — Ambulatory Visit (INDEPENDENT_AMBULATORY_CARE_PROVIDER_SITE_OTHER): Payer: Medicaid Other | Admitting: Medical

## 2023-07-04 VITALS — BP 118/64 | HR 62 | Ht 62.5 in | Wt 197.4 lb

## 2023-07-04 DIAGNOSIS — I10 Essential (primary) hypertension: Secondary | ICD-10-CM

## 2023-07-04 DIAGNOSIS — E559 Vitamin D deficiency, unspecified: Secondary | ICD-10-CM

## 2023-07-04 DIAGNOSIS — K219 Gastro-esophageal reflux disease without esophagitis: Secondary | ICD-10-CM

## 2023-07-04 DIAGNOSIS — Z0000 Encounter for general adult medical examination without abnormal findings: Secondary | ICD-10-CM

## 2023-07-04 DIAGNOSIS — Z136 Encounter for screening for cardiovascular disorders: Secondary | ICD-10-CM | POA: Diagnosis not present

## 2023-07-04 DIAGNOSIS — E1165 Type 2 diabetes mellitus with hyperglycemia: Secondary | ICD-10-CM

## 2023-07-04 DIAGNOSIS — Z780 Asymptomatic menopausal state: Secondary | ICD-10-CM

## 2023-07-04 DIAGNOSIS — Z1231 Encounter for screening mammogram for malignant neoplasm of breast: Secondary | ICD-10-CM | POA: Diagnosis not present

## 2023-07-04 DIAGNOSIS — Z1389 Encounter for screening for other disorder: Secondary | ICD-10-CM

## 2023-07-04 DIAGNOSIS — E785 Hyperlipidemia, unspecified: Secondary | ICD-10-CM

## 2023-07-04 DIAGNOSIS — Z7185 Encounter for immunization safety counseling: Secondary | ICD-10-CM

## 2023-07-04 LAB — LIPID PANEL

## 2023-07-04 NOTE — Progress Notes (Signed)
 Subjective:   HPI  Amy [name] is a 63 y.o. female who presents for Chief Complaint  Patient presents with   Annual Exam    Fasting cpe, follow-up on diabetes as well    Patient Care Team: Florencio Hollibaugh, Kermit Balo, PA-C as PCP - General (Family Medicine) Little Ishikawa, MD as Consulting Physician (Cardiology) Chalmers Guest, MD as Consulting Physician (Ophthalmology) Tanda Rockers, NP as Nurse Practitioner (Obstetrics and Gynecology) Sees dentist Sees eye doctor   Concerns: Here for well visit.  Compliant with medicaiton  She is checking glucose.  Reading for past 2 months on her notebook all between 95 -130 except for 2 readings.   No other new c/o  Reviewed their medical, surgical, family, social, medication, and allergy history and updated chart as appropriate.  Past Medical History:  Diagnosis Date   Abnormal Pap smear of cervix    Endometrial polyp 2016   [facility]   GERD (gastroesophageal reflux disease)    Glaucoma    Hyperlipidemia    Hypertension    Leiomyoma of uterus 04/29/2019   Prediabetes    Thickened endometrium 2016   per [facility] records   Vitamin D deficiency 01/15/2018    Family History  Problem Relation Age of Onset   Hypertension Mother    Heart failure Mother    Hypertension Father    Hypertension Sister    Hypertension Brother      Current Outpatient Medications:    amLODipine (NORVASC) 10 MG tablet, Take 1 tablet (10 mg total) by mouth daily., Disp: 90 tablet, Rfl: 3   atovaquone-proguanil (MALARONE) 250-100 MG TABS tablet, Take 1 tablet by mouth daily. Begin 2 days prior to trip, use daily, continue for 7 days after trip, Disp: 45 tablet, Rfl: 0   Cholecalciferol (VITAMIN D3) 50 MCG (2000 UT) capsule, Take 1 capsule (2,000 Units total) by mouth daily., Disp: 90 capsule, Rfl: 1   conjugated estrogens (PREMARIN) vaginal cream, Place 0.5 Applicatorfuls vaginally 3 (three) times a week., Disp: 42.5 g, Rfl: 12    dorzolamide-timolol (COSOPT) 22.3-6.8 MG/ML ophthalmic solution, 1 drop 2 (two) times daily., Disp: , Rfl:    EYSUVIS 0.25 % SUSP, Apply 1 drop to eye 2 (two) times daily., Disp: , Rfl:    gabapentin (NEURONTIN) 300 MG capsule, Take 1 capsule (300 mg total) by mouth at bedtime., Disp: 90 capsule, Rfl: 1   hydrochlorothiazide (HYDRODIURIL) 12.5 MG tablet, Take 1 tablet (12.5 mg total) by mouth daily., Disp: 90 tablet, Rfl: 3   LUMIGAN 0.01 % SOLN, , Disp: , Rfl:    metFORMIN (GLUCOPHAGE) 500 MG tablet, Take 1 tablet (500 mg total) by mouth 2 (two) times daily with a meal., Disp: 180 tablet, Rfl: 2   Multiple Vitamin (MULTIVITAMIN) tablet, Take 1 tablet by mouth daily., Disp: , Rfl:    ondansetron (ZOFRAN-ODT) 4 MG disintegrating tablet, Take 1 tablet (4 mg total) by mouth every 8 (eight) hours as needed for nausea or vomiting., Disp: 20 tablet, Rfl: 0   rosuvastatin (CRESTOR) 5 MG tablet, Take 1 tablet (5 mg total) by mouth daily., Disp: 90 tablet, Rfl: 3   Blood Glucose Monitoring Suppl DEVI, Test 1-2 times day. Needs accu-chek, Disp: 1 each, Rfl: 0   dicyclomine (BENTYL) 10 MG capsule, Take 1 capsule (10 mg total) by mouth 3 (three) times daily as needed for spasms. (Patient not taking: Reported on 07/04/2023), Disp: 30 capsule, Rfl: 0   Glucose Blood (BLOOD GLUCOSE TEST STRIPS) STRP, Test  1-2 times daily. Needs accu-chek meter, Disp: 100 strip, Rfl: 0   Lancet Device MISC, 1-2 times daily, Disp: 1 each, Rfl: 0   Lancets Misc. MISC, 1-2 times a day, Disp: 100 each, Rfl: 0  Allergies  Allergen Reactions   Atorvastatin     myalgia   Chloroquine     Itching    Review of Systems  Constitutional:  Negative for chills, fever, malaise/fatigue and weight loss.  HENT:  Negative for congestion, ear pain, hearing loss, sore throat and tinnitus.   Eyes:  Negative for blurred vision, pain and redness.  Respiratory:  Negative for cough, hemoptysis and shortness of breath.   Cardiovascular:  Negative  for chest pain, palpitations, orthopnea, claudication and leg swelling.  Gastrointestinal:  Negative for abdominal pain, blood in stool, constipation, diarrhea, nausea and vomiting.  Genitourinary:  Negative for dysuria, flank pain, frequency, hematuria and urgency.  Musculoskeletal:  Negative for falls, joint pain and myalgias.  Skin:  Negative for itching and rash.  Neurological:  Negative for dizziness, tingling, speech change, weakness and headaches.  Endo/Heme/Allergies:  Negative for polydipsia. Does not bruise/bleed easily.  Psychiatric/Behavioral:  Negative for depression and memory loss. The patient is not nervous/anxious and does not have insomnia.         07/04/2023    1:49 PM 03/21/2023    2:05 PM 12/21/2022    2:14 PM 01/05/2022    1:32 PM 02/16/2021    2:48 PM  Depression screen PHQ 2/9  Decreased Interest 0 0 0 0 0  Down, Depressed, Hopeless 0 0 0 0 0  PHQ - 2 Score 0 0 0 0 0       Objective:  BP 118/64   Pulse 62   Ht 5' 2.5" (1.588 m)   Wt 197 lb 6.4 oz (89.5 kg)   LMP 04/04/2012 Comment: not sexually active  BMI 35.53 kg/m   General appearance: alert, no distress, WD/WN, African American female Skin: unremarkable HEENT: normocephalic, conjunctiva/corneas normal, sclerae anicteric, PERRLA, EOMi, nares patent, no discharge or erythema, pharynx normal Oral cavity: MMM, tongue normal, teeth normal Neck: supple, no lymphadenopathy, no thyromegaly, no masses, normal ROM, no bruits Chest: non tender, normal shape and expansion Heart: RRR, normal S1, S2, no murmurs Lungs: CTA bilaterally, no wheezes, rhonchi, or rales Abdomen: +bs, soft, non tender, non distended, no masses, no hepatomegaly, no splenomegaly, no bruits Back: non tender, normal ROM, no scoliosis Musculoskeletal: upper extremities non tender, no obvious deformity, normal ROM throughout, lower extremities non tender, no obvious deformity, normal ROM throughout Extremities: no edema, no cyanosis, no  clubbing Pulses: 2+ symmetric, upper and lower extremities, normal cap refill Neurological: alert, oriented x 3, CN2-12 intact, strength normal upper extremities and lower extremities, sensation normal throughout, DTRs 2+ throughout, no cerebellar signs, gait normal Psychiatric: normal affect, behavior normal, pleasant  Breast/gyn/rectal - deferred to gynecology    EKG Sinus bradycardia, longstanding, no new changes    Assessment and Plan :   Encounter Diagnoses  Name Primary?   Encounter for health maintenance examination in adult Yes   Screening mammogram for breast cancer    Screening for hematuria or proteinuria    Vaccine counseling    Postmenopausal estrogen deficiency    Screening for heart disease    Essential hypertension    Gastroesophageal reflux disease, unspecified whether esophagitis present    Vitamin D deficiency    Type 2 diabetes mellitus with hyperglycemia, without long-term current use of insulin (HCC)  Hyperlipidemia, unspecified hyperlipidemia type      This visit was a preventative care visit, also known as wellness visit or routine physical.   Topics typically include healthy lifestyle, diet, exercise, preventative care, vaccinations, sick and well care, proper use of emergency dept and after hours care, as well as other concerns.     Recommendations: Continue to return yearly for your annual wellness and preventative care visits.  This gives [place] a chance to discuss healthy lifestyle, exercise, vaccinations, review your chart record, and perform screenings where appropriate.  I recommend you see your eye doctor yearly for routine vision care.  I recommend you see your dentist yearly for routine dental care including hygiene visits twice yearly.  See your gynecologist yearly for routine gynecological care.   Vaccination recommendations were reviewed Immunization History  Administered Date(s) Administered   PFIZER(Purple Top)SARS-COV-2 Vaccination  06/28/2019, 07/23/2019, 06/02/2020   Tdap 04/25/2017   I recommend shingles, flu and pneumococcal vaccines  She plans to return after her trip [place] to get Shingrix    Screening for cancer: Colon cancer screening: She will bring copy of last colonoscopy by.  She thinks she is not due until 2029  Breast cancer screening: You should perform a self breast exam monthly.   We reviewed recommendations for regular mammograms and breast cancer screening.  Please call to schedule your mammogram and bone density test.   [facility]  [phone] [address], [address] [place], [place] [address]   Cervical cancer screening: Follow up with gynecology this year for repeat pap smear.  Your pap last year showed some abnormality    Skin cancer screening: Check your skin regularly for new changes, growing lesions, or other lesions of concern Come in for evaluation if you have skin lesions of concern.  Lung cancer screening: If you have a greater than 20 pack year history of tobacco use, then you may qualify for lung cancer screening with a chest CT scan.   Please call your insurance company to inquire about coverage for this test.  We currently don't have screenings for other cancers besides breast, cervical, colon, and lung cancers.  If you have a strong family history of cancer or have other cancer screening concerns, please let me know.    Bone health: Get at least 150 minutes of aerobic exercise weekly Get weight bearing exercise at least once weekly Bone density test:  A bone density test is an imaging test that uses a type of X-ray to measure the amount of calcium and other minerals in your bones. The test may be used to diagnose or screen you for a condition that causes weak or thin bones (osteoporosis), predict your risk for a broken bone (fracture), or determine how well your osteoporosis treatment is working. The bone density test is recommended  for females 65 and older, or females or males <65 if certain risk factors such as thyroid disease, long term use of steroids such as for asthma or rheumatological issues, vitamin D deficiency, estrogen deficiency, family history of osteoporosis, self or family history of fragility fracture in first degree relative.  Please call to schedule your bone density test.   [facility]  [phone] [address], [address] [place], [place] [address]    Heart health: Get at least 150 minutes of aerobic exercise weekly Limit alcohol It is important to maintain a healthy blood pressure and healthy cholesterol numbers  Heart disease screening: Screening for  heart disease includes screening for blood pressure, fasting lipids, glucose/diabetes screening, BMI height to weight ratio, reviewed of smoking status, physical activity, and diet.    Goals include blood pressure 120/80 or less, maintaining a healthy lipid/cholesterol profile, preventing diabetes or keeping diabetes numbers under good control, not smoking or using tobacco products, exercising most days per week or at least 150 minutes per week of exercise, and eating healthy variety of fruits and vegetables, healthy oils, and avoiding unhealthy food choices like fried food, fast food, high sugar and high cholesterol foods.    Other tests may possibly include EKG test, CT coronary calcium score, echocardiogram, exercise treadmill stress test.    Medical care options: I recommend you continue to seek care here first for routine care.  We try really hard to have available appointments Monday through Friday daytime hours for sick visits, acute visits, and physicals.  Urgent care should be used for after hours and weekends for significant issues that cannot wait till the next day.  The emergency department should be used for significant potentially life-threatening emergencies.  The emergency department is expensive, can  often have long wait times for less significant concerns, so try to utilize primary care, urgent care, or telemedicine when possible to avoid unnecessary trips to the emergency department.  Virtual visits and telemedicine have been introduced since the pandemic started in 2020, and can be convenient ways to receive medical care.  We offer virtual appointments as well to assist you in a variety of options to seek medical care.   Advanced Directives: I recommend you consider completing a Health Care Power of Attorney and Living Will.   These documents respect your wishes and help alleviate burdens on your loved ones if you were to become terminally ill or be in a position to need those documents enforced.    You can complete Advanced Directives yourself, have them notarized, then have copies made for our office, for you and for anybody you feel should have them in safe keeping.  Or, you can have an attorney prepare these documents.   If you haven't updated your Last Will and Testament in a while, it may be worthwhile having an attorney prepare these documents together and save on some costs.       Separate significant issues discussed: Hypertension-continue hydrochlorothiazide 12.5 mg daily, amlodipine 10 mg daily  Hyperlipidemia-continue Rosvuastatin 5 mg daily  Diabetes-continue metformin 500mg  BID  Work on getting exercise regularly  Work on losing weight through diet and exercise  Chronic back pain - she continues on Gabapentin 300mg  at bedtime   Amy [name] was seen today for annual exam.  Diagnoses and all orders for this visit:  Encounter for health maintenance examination in adult -     MM 3D SCREENING MAMMOGRAM BILATERAL BREAST -     DG Bone Density; Future -     Hemoglobin A1c -     Comprehensive metabolic panel with GFR -     Lipid panel -     TSH -     VITAMIN D 25 Hydroxy (Vit-D Deficiency, Fractures) -     EKG 12-Lead  Screening mammogram for breast cancer -     MM 3D  SCREENING MAMMOGRAM BILATERAL BREAST  Screening for hematuria or proteinuria  Vaccine counseling  Postmenopausal estrogen deficiency -     DG Bone Density; Future  Screening for heart disease -     EKG 12-Lead  Essential hypertension  Gastroesophageal reflux disease, unspecified whether esophagitis present  Vitamin D deficiency  Type 2 diabetes mellitus with hyperglycemia, without long-term current use of insulin (HCC) -     Hemoglobin A1c  Hyperlipidemia, unspecified hyperlipidemia type   Follow-up pending labs, yearly for physical

## 2023-07-04 NOTE — Patient Instructions (Addendum)
 Please call to schedule your mammogram and bone density test.   [facility]  [phone] [address], [address] [place], [place] [address]  This visit was a preventative care visit, also known as wellness visit or routine physical.   Topics typically include healthy lifestyle, diet, exercise, preventative care, vaccinations, sick and well care, proper use of emergency dept and after hours care, as well as other concerns.     Recommendations: Continue to return yearly for your annual wellness and preventative care visits.  This gives [place] a chance to discuss healthy lifestyle, exercise, vaccinations, review your chart record, and perform screenings where appropriate.  I recommend you see your eye doctor yearly for routine vision care.  I recommend you see your dentist yearly for routine dental care including hygiene visits twice yearly.  See your gynecologist yearly for routine gynecological care.   Vaccination recommendations were reviewed Immunization History  Administered Date(s) Administered   PFIZER(Purple Top)SARS-COV-2 Vaccination 06/28/2019, 07/23/2019, 06/02/2020   Tdap 04/25/2017   I recommend shingles, flu and pneumococcal vaccines  She plans to return after her trip [place] to get Shingrix    Screening for cancer: Colon cancer screening: She will bring copy of last colonoscopy by.  She thinks she is not due until 2029  Breast cancer screening: You should perform a self breast exam monthly.   We reviewed recommendations for regular mammograms and breast cancer screening.  Please call to schedule your mammogram and bone density test.   [facility]  [phone] [address] [place], [place] [address]   Cervical cancer screening: Follow up with gynecology this year for repeat pap smear.  Your pap last year showed some abnormality    Skin cancer screening: Check your skin regularly for  new changes, growing lesions, or other lesions of concern Come in for evaluation if you have skin lesions of concern.  Lung cancer screening: If you have a greater than 20 pack year history of tobacco use, then you may qualify for lung cancer screening with a chest CT scan.   Please call your insurance company to inquire about coverage for this test.  We currently don't have screenings for other cancers besides breast, cervical, colon, and lung cancers.  If you have a strong family history of cancer or have other cancer screening concerns, please let me know.    Bone health: Get at least 150 minutes of aerobic exercise weekly Get weight bearing exercise at least once weekly Bone density test:  A bone density test is an imaging test that uses a type of X-ray to measure the amount of calcium and other minerals in your bones. The test may be used to diagnose or screen you for a condition that causes weak or thin bones (osteoporosis), predict your risk for a broken bone (fracture), or determine how well your osteoporosis treatment is working. The bone density test is recommended for females 65 and older, or females or males <65 if certain risk factors such as thyroid disease, long term use of steroids such as for asthma or rheumatological issues, vitamin D deficiency, estrogen deficiency, family history of osteoporosis, self or family history of fragility fracture in first degree relative.  Please call to schedule your bone density test.   [facility]  [phone] [address] [place], [place] [address]    Heart health: Get at least 150 minutes of aerobic exercise weekly Limit  alcohol It is important to maintain a healthy blood pressure and healthy cholesterol numbers  Heart disease screening: Screening for heart disease includes screening for blood pressure, fasting lipids, glucose/diabetes screening, BMI height to weight ratio, reviewed of  smoking status, physical activity, and diet.    Goals include blood pressure 120/80 or less, maintaining a healthy lipid/cholesterol profile, preventing diabetes or keeping diabetes numbers under good control, not smoking or using tobacco products, exercising most days per week or at least 150 minutes per week of exercise, and eating healthy variety of fruits and vegetables, healthy oils, and avoiding unhealthy food choices like fried food, fast food, high sugar and high cholesterol foods.    Other tests may possibly include EKG test, CT coronary calcium score, echocardiogram, exercise treadmill stress test.    Medical care options: I recommend you continue to seek care here first for routine care.  We try really hard to have available appointments Monday through Friday daytime hours for sick visits, acute visits, and physicals.  Urgent care should be used for after hours and weekends for significant issues that cannot wait till the next day.  The emergency department should be used for significant potentially life-threatening emergencies.  The emergency department is expensive, can often have long wait times for less significant concerns, so try to utilize primary care, urgent care, or telemedicine when possible to avoid unnecessary trips to the emergency department.  Virtual visits and telemedicine have been introduced since the pandemic started in 2020, and can be convenient ways to receive medical care.  We offer virtual appointments as well to assist you in a variety of options to seek medical care.   Advanced Directives: I recommend you consider completing a Health Care Power of Attorney and Living Will.   These documents respect your wishes and help alleviate burdens on your loved ones if you were to become terminally ill or be in a position to need those documents enforced.    You can complete Advanced Directives yourself, have them notarized, then have copies made for our office, for you and  for anybody you feel should have them in safe keeping.  Or, you can have an attorney prepare these documents.   If you haven't updated your Last Will and Testament in a while, it may be worthwhile having an attorney prepare these documents together and save on some costs.

## 2023-07-05 ENCOUNTER — Other Ambulatory Visit: Payer: Self-pay | Admitting: Medical

## 2023-07-05 DIAGNOSIS — Z780 Asymptomatic menopausal state: Secondary | ICD-10-CM

## 2023-07-05 DIAGNOSIS — Z0000 Encounter for general adult medical examination without abnormal findings: Secondary | ICD-10-CM

## 2023-07-05 LAB — COMPREHENSIVE METABOLIC PANEL WITH GFR
ALT: 22 IU/L (ref 0–32)
AST: 18 IU/L (ref 0–40)
Albumin: 4.3 g/dL (ref 3.9–4.9)
Alkaline Phosphatase: 74 IU/L (ref 44–121)
BUN/Creatinine Ratio: 17 (ref 12–28)
BUN: 15 mg/dL (ref 8–27)
Bilirubin Total: 0.6 mg/dL (ref 0.0–1.2)
CO2: 25 mmol/L (ref 20–29)
Calcium: 9.9 mg/dL (ref 8.7–10.3)
Chloride: 105 mmol/L (ref 96–106)
Creatinine, Ser: 0.87 mg/dL (ref 0.57–1.00)
Globulin, Total: 2.7 g/dL (ref 1.5–4.5)
Glucose: 94 mg/dL (ref 70–99)
Potassium: 4.2 mmol/L (ref 3.5–5.2)
Sodium: 144 mmol/L (ref 134–144)
Total Protein: 7 g/dL (ref 6.0–8.5)
eGFR: 76 mL/min/{1.73_m2} (ref >59)

## 2023-07-05 LAB — VITAMIN D 25 HYDROXY (VIT D DEFICIENCY, FRACTURES): Vit D, 25-Hydroxy: 44.6 ng/mL (ref 30.0–100.0)

## 2023-07-05 LAB — LIPID PANEL
Cholesterol, Total: 118 mg/dL (ref 100–199)
HDL: 48 mg/dL (ref >39)
LDL CALC COMMENT:: 2.5 ratio (ref 0.0–4.4)
LDL Chol Calc (NIH): 57 mg/dL (ref 0–99)
Triglycerides: 61 mg/dL (ref 0–149)
VLDL Cholesterol Cal: 13 mg/dL (ref 5–40)

## 2023-07-05 LAB — TSH: TSH: 1.1 u[IU]/mL (ref 0.450–4.500)

## 2023-07-05 LAB — EKG 12-LEAD

## 2023-07-05 LAB — HEMOGLOBIN A1C
Est. average glucose Bld gHb Est-mCnc: 137 mg/dL
Hgb A1c MFr Bld: 6.4 % — ABNORMAL HIGH (ref 4.8–5.6)

## 2023-07-05 NOTE — Progress Notes (Signed)
 Results sent through MyChart

## 2023-09-02 ENCOUNTER — Other Ambulatory Visit: Payer: Self-pay | Admitting: Medical

## 2023-09-11 ENCOUNTER — Encounter: Payer: Medicaid Other | Admitting: Medical

## 2023-11-06 ENCOUNTER — Other Ambulatory Visit: Payer: Self-pay | Admitting: Medical

## 2023-11-06 DIAGNOSIS — R7309 Other abnormal glucose: Secondary | ICD-10-CM

## 2023-11-28 ENCOUNTER — Ambulatory Visit
Admission: RE | Admit: 2023-11-28 | Discharge: 2023-11-28 | Disposition: A | Discharge status: Home / Self Care | Source: Ambulatory Visit | Attending: Medical | Admitting: Medical

## 2023-11-30 ENCOUNTER — Ambulatory Visit (HOSPITAL_BASED_OUTPATIENT_CLINIC_OR_DEPARTMENT_OTHER)
Admission: RE | Admit: 2023-11-30 | Discharge: 2023-11-30 | Disposition: A | Discharge status: Home / Self Care | Source: Ambulatory Visit | Attending: Medical | Admitting: Medical

## 2023-11-30 DIAGNOSIS — Z780 Asymptomatic menopausal state: Secondary | ICD-10-CM | POA: Insufficient documentation

## 2023-11-30 DIAGNOSIS — Z0000 Encounter for general adult medical examination without abnormal findings: Secondary | ICD-10-CM | POA: Insufficient documentation

## 2023-12-03 ENCOUNTER — Ambulatory Visit: Payer: Self-pay | Admitting: Medical

## 2023-12-03 NOTE — Progress Notes (Signed)
 Results thru my chart

## 2023-12-03 NOTE — Progress Notes (Signed)
Mammogram shows some questionable findings and other imaging recommended.  [facility]  If not heard back within 1 week, call us back.

## 2023-12-05 ENCOUNTER — Telehealth: Payer: Self-pay | Admitting: Internal Medicine

## 2023-12-05 ENCOUNTER — Other Ambulatory Visit: Payer: Self-pay | Admitting: Medical

## 2023-12-05 DIAGNOSIS — R928 Other abnormal and inconclusive findings on diagnostic imaging of breast: Secondary | ICD-10-CM

## 2023-12-05 NOTE — Telephone Encounter (Signed)
 Copied from CRM #[id number]. Topic: Clinical - Request for Lab/Test Order >> Dec 05, 2023 11:40 AM Graeme ORN wrote: Reason for CRM: [facility] called. Put in orders for Right breast mammogram and ultra sound additional imaging.Appt 9/4 at 2:00 need co sign by provider.

## 2023-12-05 NOTE — Telephone Encounter (Signed)
This has already been done and scheduled.

## 2023-12-07 ENCOUNTER — Encounter

## 2023-12-07 ENCOUNTER — Other Ambulatory Visit

## 2023-12-12 ENCOUNTER — Other Ambulatory Visit: Payer: Self-pay | Admitting: Medical

## 2023-12-12 ENCOUNTER — Ambulatory Visit
Admission: RE | Admit: 2023-12-12 | Discharge: 2023-12-12 | Disposition: A | Discharge status: Home / Self Care | Source: Ambulatory Visit | Attending: Medical | Admitting: Medical

## 2023-12-12 DIAGNOSIS — R928 Other abnormal and inconclusive findings on diagnostic imaging of breast: Secondary | ICD-10-CM

## 2023-12-12 DIAGNOSIS — N6489 Other specified disorders of breast: Secondary | ICD-10-CM

## 2023-12-13 ENCOUNTER — Ambulatory Visit: Payer: Self-pay | Admitting: Medical

## 2023-12-13 NOTE — Progress Notes (Signed)
 Mammogram abnormal and biopsy has been requested.  She should be getting correspondence from breast center about this

## 2023-12-15 ENCOUNTER — Ambulatory Visit
Admission: RE | Admit: 2023-12-15 | Discharge: 2023-12-15 | Disposition: A | Discharge status: Home / Self Care | Source: Ambulatory Visit | Attending: Medical | Admitting: Medical

## 2023-12-15 DIAGNOSIS — N6489 Other specified disorders of breast: Secondary | ICD-10-CM

## 2023-12-15 HISTORY — PX: BREAST BIOPSY: SHX20

## 2023-12-18 LAB — SURGICAL PATHOLOGY

## 2024-01-03 ENCOUNTER — Ambulatory Visit: Admitting: Medical

## 2024-01-03 ENCOUNTER — Encounter: Payer: Self-pay | Admitting: Medical

## 2024-01-03 ENCOUNTER — Encounter: Admitting: Medical

## 2024-01-03 VITALS — BP 110/64 | HR 61 | Wt 195.0 lb

## 2024-01-03 DIAGNOSIS — I10 Essential (primary) hypertension: Secondary | ICD-10-CM

## 2024-01-03 DIAGNOSIS — E785 Hyperlipidemia, unspecified: Secondary | ICD-10-CM | POA: Diagnosis not present

## 2024-01-03 DIAGNOSIS — E1165 Type 2 diabetes mellitus with hyperglycemia: Secondary | ICD-10-CM | POA: Diagnosis not present

## 2024-01-03 DIAGNOSIS — E569 Vitamin deficiency, unspecified: Secondary | ICD-10-CM

## 2024-01-03 DIAGNOSIS — Z7185 Encounter for immunization safety counseling: Secondary | ICD-10-CM | POA: Diagnosis not present

## 2024-01-03 DIAGNOSIS — R252 Cramp and spasm: Secondary | ICD-10-CM

## 2024-01-03 LAB — MICROSCOPIC EXAMINATION

## 2024-01-03 LAB — LIPID PANEL

## 2024-01-03 MED ORDER — PREGABALIN 25 MG PO CAPS: 25.0000 mg | ORAL_CAPSULE | Freq: Two times a day (BID) | ORAL | 2 refills | Status: AC

## 2024-01-03 NOTE — Progress Notes (Signed)
 Name: Amy [name]   Date of Visit: 01/03/24   Date of last visit with me: 11/06/2023   CHIEF COMPLAINT:  Chief Complaint  Patient presents with   Medical Management of Chronic Issues    6 month med check. Declines flu shot, eye exam-whittaker       HPI:  Discussed the use of AI scribe software for clinical note transcription with the patient, who gave verbal consent to proceed.  History of Present Illness  Amy [name] is a 63 year old female who presents for medication management.  She reports taking amlodipine  10 mg daily and Hydrochlorothiazide  12.5mg  daily for hypertension, Crestor  5 mg daily for hyperlipidemia, and metformin  500 mg twice daily for diabetes. She is not currently taking gabapentin , which she used previously but discontinued. She takes gabapentin  as needed for severe pain, often in conjunction with tramadol , but notes that her supply of gabapentin  is depleted.  Her home blood sugar readings range from 99 to 130 mg/dL, with most values in the mid-100s. She monitors her diet closely, focusing on fruits and vegetables, which she believes helps maintain her blood sugar levels. She avoids rice and bread, preferring dry yucca and okra soup. Occasionally, she eats at [facility], enjoying bread, steak, and chicken alfredo, but this is infrequent.  She has a history of chronic back pain, which limits her ability to exercise daily. She exercises when possible but experiences increased pain with activity.  Uses gabapentin  sometimes, not daily.   Wants to try Lyrica.   Her father took this.  She had an eye exam last month, but the results are not available in her current records. She is due for routine blood work, as her last panel was over a year ago.  Past Medical History:  Diagnosis Date   Abnormal Pap smear of cervix    Endometrial polyp 2016   [facility]   GERD (gastroesophageal reflux disease)    Glaucoma    Hyperlipidemia    Hypertension    Leiomyoma of uterus  04/29/2019   Prediabetes    Thickened endometrium 2016   per [facility] records   Vitamin D  deficiency 01/15/2018   Current Outpatient Medications on File Prior to Visit  Medication Sig Dispense Refill   amLODipine  (NORVASC ) 10 MG tablet Take 1 tablet (10 mg total) by mouth daily. 90 tablet 3   dorzolamide-timolol (COSOPT) 22.3-6.8 MG/ML ophthalmic solution 1 drop 2 (two) times daily.     hydrochlorothiazide  (HYDRODIURIL ) 12.5 MG tablet Take 1 tablet (12.5 mg total) by mouth daily. 90 tablet 3   LUMIGAN 0.01 % SOLN      metFORMIN  (GLUCOPHAGE ) 500 MG tablet TAKE 1 TABLET BY MOUTH 2 TIMES DAILY WITH A MEAL. 180 tablet 0   Multiple Vitamin (MULTIVITAMIN) tablet Take 1 tablet by mouth daily.     rosuvastatin  (CRESTOR ) 5 MG tablet Take 1 tablet (5 mg total) by mouth daily. 90 tablet 3   ACCU-CHEK GUIDE TEST test strip TEST BLOOD GLUCOSE 1-2 TIMES DAILY. 50 strip 1   atovaquone -proguanil (MALARONE ) 250-100 MG TABS tablet Take 1 tablet by mouth daily. Begin 2 days prior to trip, use daily, continue for 7 days after trip 45 tablet 0   Blood Glucose Monitoring Suppl DEVI Test 1-2 times day. Needs accu-chek 1 each 0   gabapentin  (NEURONTIN ) 300 MG capsule Take 1 capsule (300 mg total) by mouth at bedtime. (Patient not taking: Reported on 01/03/2024) 90 capsule 1   Lancet Device MISC 1-2 times daily  1 each 0   Lancets Misc. MISC 1-2 times a day 100 each 0   No current facility-administered medications on file prior to visit.   ROS as in subjective    Objective: BP 110/64   Pulse 61   Wt 195 lb (88.5 kg)   LMP 04/04/2012 Comment: not sexually active  BMI 35.10 kg/m   General appearence: alert, no distress, WD/WN,  Neck: supple, no lymphadenopathy, no thyromegaly, no masses Heart: RRR, normal S1, S2, no murmurs Lungs: CTA bilaterally, no wheezes, rhonchi, or rales Abdomen: +bs, soft, non tender, non distended, no masses, no hepatomegaly, no splenomegaly Pulses: 2+ symmetric,  upper and lower extremities, normal cap refill  Diabetic Foot Exam - Simple   Simple Foot Form Diabetic Foot exam was performed with the following findings: Yes 01/03/2024 11:38 AM  Visual Inspection No deformities, no ulcerations, no other skin breakdown bilaterally: Yes Sensation Testing Intact to touch and monofilament testing bilaterally: Yes Pulse Check Posterior Tibialis and Dorsalis pulse intact bilaterally: Yes Comments        Assessment and Plan Encounter Diagnoses  Name Primary?   Type 2 diabetes mellitus with hyperglycemia, without long-term current use of insulin (HCC) Yes   Hyperlipidemia, unspecified hyperlipidemia type    Essential hypertension    Vaccine counseling    Muscle cramp    Vitamin deficiency      Type 2 diabetes mellitus Well-controlled with metformin . Home blood sugar readings indicate good glycemic control. Mindful of diet. - Continue metformin  500 mg twice daily, but pending labs, change to XR once daily at her request for convenience - Request the most recent diabetic eye exam report.  Essential hypertension Managed with amlodipine . Compliant with medication regimen. - Continue amlodipine  10 mg daily. -continue Hydrochlorothiazide  12.5mg  daily  Hyperlipidemia Managed with Crestor . Compliant with medication regimen. - Continue Crestor  5 mg daily.  Chronic back pain Uses gabapentin  and tramadol  as needed for severe pain. -change to Lyrica.  Begin 25mg  twice daily -we can potentially increase dose in 1 month  Vaccines -return at your convenience for vaccines You are due for flu vaccine, Prevnar 20 vaccine and Shingrix vaccine  Vitamin D  deficiency -continue vitamin D  supplement  Begin daily multivitamin over the counter  Cramping of hands -drink 80-100 ounces of water daily -labs for further evaluation   [name] was seen today for medical management of chronic issues.  Diagnoses and all orders for this visit:  Type 2 diabetes  mellitus with hyperglycemia, without long-term current use of insulin (HCC) -     Microalbumin/Creatinine Ratio, Urine -     Hemoglobin A1c -     Magnesium   Hyperlipidemia, unspecified hyperlipidemia type -     Lipid Panel  Essential hypertension -     Urinalysis, Routine w reflex microscopic -     Basic metabolic panel with GFR -     CBC with Differential  Vaccine counseling  Muscle cramp -     Magnesium  -     Vitamin B12  Vitamin deficiency -     Vitamin B12  Other orders -     pregabalin (LYRICA) 25 MG capsule; Take 1 capsule (25 mg total) by mouth 2 (two) times daily.    F/u pending labs

## 2024-01-03 NOTE — Patient Instructions (Signed)
  Type 2 diabetes mellitus Well-controlled with metformin . Home blood sugar readings indicate good glycemic control. Mindful of diet. - Continue metformin  500 mg twice daily, but pending labs, change to XR once daily at her request for convenience - Request the most recent diabetic eye exam report.  Essential hypertension Managed with amlodipine . Compliant with medication regimen. - Continue amlodipine  10 mg daily. -continue Hydrochlorothiazide  12.5mg  daily  Hyperlipidemia Managed with Crestor . Compliant with medication regimen. - Continue Crestor  5 mg daily.  Chronic back pain Uses gabapentin  and tramadol  as needed for severe pain. -change to Lyrica.  Begin 25mg  twice daily -we can potentially increase dose in 1 month  Vaccines -return at your convenience for vaccines You are due for flu vaccine, Prevnar 20 vaccine and Shingrix vaccine  Vitamin D  deficiency -continue vitamin D  supplement  Begin daily multivitamin over the counter  Cramping of hands -drink 80-100 ounces of water daily -labs for further evaluation

## 2024-01-04 ENCOUNTER — Other Ambulatory Visit: Payer: Self-pay | Admitting: Medical

## 2024-01-04 ENCOUNTER — Ambulatory Visit: Payer: Self-pay | Admitting: Medical

## 2024-01-04 LAB — LIPID PANEL
Cholesterol, Total: 151 mg/dL (ref 100–199)
HDL: 46 mg/dL (ref >39)
LDL CALC COMMENT:: 3.3 ratio (ref 0.0–4.4)
LDL Chol Calc (NIH): 86 mg/dL (ref 0–99)
Triglycerides: 101 mg/dL (ref 0–149)
VLDL Cholesterol Cal: 19 mg/dL (ref 5–40)

## 2024-01-04 LAB — CBC WITH DIFFERENTIAL/PLATELET
Basophils Absolute: 0 x10E3/uL (ref 0.0–0.2)
Basos: 1 %
EOS (ABSOLUTE): 0.1 x10E3/uL (ref 0.0–0.4)
Eos: 3 %
Hematocrit: 43.3 % (ref 34.0–46.6)
Hemoglobin: 14.1 g/dL (ref 11.1–15.9)
Immature Grans (Abs): 0 x10E3/uL (ref 0.0–0.1)
Immature Granulocytes: 0 %
Lymphocytes Absolute: 1.8 x10E3/uL (ref 0.7–3.1)
Lymphs: 45 %
MCH: 30.3 pg (ref 26.6–33.0)
MCHC: 32.6 g/dL (ref 31.5–35.7)
MCV: 93 fL (ref 79–97)
Monocytes Absolute: 0.3 x10E3/uL (ref 0.1–0.9)
Monocytes: 8 %
Neutrophils Absolute: 1.7 x10E3/uL (ref 1.4–7.0)
Neutrophils: 43 %
Platelets: 362 x10E3/uL (ref 150–450)
RBC: 4.66 x10E6/uL (ref 3.77–5.28)
RDW: 12.3 % (ref 11.7–15.4)
WBC: 4 x10E3/uL (ref 3.4–10.8)

## 2024-01-04 LAB — BASIC METABOLIC PANEL WITH GFR
BUN/Creatinine Ratio: 10 — AB (ref 12–28)
BUN: 8 mg/dL (ref 8–27)
CO2: 22 mmol/L (ref 20–29)
Calcium: 9.9 mg/dL (ref 8.7–10.3)
Chloride: 106 mmol/L (ref 96–106)
Creatinine, Ser: 0.8 mg/dL (ref 0.57–1.00)
Glucose: 121 mg/dL — AB (ref 70–99)
Potassium: 4.1 mmol/L (ref 3.5–5.2)
Sodium: 143 mmol/L (ref 134–144)
eGFR: 83 mL/min/1.73 (ref >59)

## 2024-01-04 LAB — URINALYSIS, ROUTINE W REFLEX MICROSCOPIC
Glucose, UA: NEGATIVE
Nitrite, UA: NEGATIVE
RBC, UA: NEGATIVE
Specific Gravity, UA: 1.025 (ref 1.005–1.030)
Urobilinogen, Ur: 1 mg/dL (ref 0.2–1.0)
pH, UA: 8.5 — ABNORMAL HIGH (ref 5.0–7.5)

## 2024-01-04 LAB — HEMOGLOBIN A1C
Est. average glucose Bld gHb Est-mCnc: 131 mg/dL
Hgb A1c MFr Bld: 6.2 % — ABNORMAL HIGH (ref 4.8–5.6)

## 2024-01-04 LAB — VITAMIN B12: Vitamin B-12: 1608 pg/mL — ABNORMAL HIGH (ref 232–1245)

## 2024-01-04 LAB — MICROSCOPIC EXAMINATION
Casts: NONE SEEN
Renal Epithel, UA: NONE SEEN /LPF
WBC, UA: NONE SEEN /HPF (ref 0–5)

## 2024-01-04 LAB — MAGNESIUM: Magnesium: 2.1 mg/dL (ref 1.6–2.3)

## 2024-01-04 LAB — MICROALBUMIN / CREATININE URINE RATIO
Creatinine, Urine: 429.4 mg/dL
Microalb/Creat Ratio: 12 mg/g{creat} (ref 0–29)
Microalbumin, Urine: 52.8 ug/mL

## 2024-01-04 MED ORDER — METFORMIN HCL ER 500 MG PO TB24: 1000.0000 mg | ORAL_TABLET | Freq: Every day | ORAL | 1 refills | Status: AC

## 2024-01-04 NOTE — Progress Notes (Signed)
 Results to MyChart

## 2024-01-04 NOTE — Progress Notes (Signed)
 Labs overall pretty stable.  Still pending microalbumin kidney marker.  Hemoglobin A1c 6.2%.  Cholesterol looks good.  Your urine was abnorma with elevated pH.  Is she having any urinary symptoms that she suspects urinary tract infection?  I am going to change the metformin  to XR once daily as we discussed.  Continue rest of medicines as usual  Regarding cramping in the hands make sure she is drinking 80 to 100 ounces of water a day.  She can try over-the-counter magnesium  plus potassium combo supplement or she can eat a teaspoon of mustard daily or she can consume a cup of diet tonic water once or twice daily  Recheck yearly for physical in April

## 2024-01-29 ENCOUNTER — Encounter: Payer: Self-pay | Admitting: Physical Therapy

## 2024-01-29 ENCOUNTER — Other Ambulatory Visit: Payer: Self-pay

## 2024-01-29 ENCOUNTER — Ambulatory Visit: Attending: Neurosurgery | Admitting: Physical Therapy

## 2024-01-29 DIAGNOSIS — M5416 Radiculopathy, lumbar region: Secondary | ICD-10-CM | POA: Diagnosis present

## 2024-01-29 DIAGNOSIS — M5459 Other low back pain: Secondary | ICD-10-CM | POA: Insufficient documentation

## 2024-01-29 NOTE — Therapy (Signed)
 OUTPATIENT PHYSICAL THERAPY THORACOLUMBAR EVALUATION   Patient Name: Amy [name] MRN: [id number] DOB:[birth year], 63 y.o., female Today's Date: 01/29/2024  END OF SESSION:  PT End of Session - 01/29/24 1158     Visit Number 1    Number of Visits 17    Date for Recertification  04/08/24    PT Start Time 1206    PT Stop Time 1254    PT Time Calculation (min) 48 min    Activity Tolerance Patient tolerated treatment well;Patient limited by pain    Behavior During Therapy [facility] for tasks assessed/performed          Past Medical History:  Diagnosis Date   Abnormal Pap smear of cervix    Endometrial polyp 2016   [facility]   GERD (gastroesophageal reflux disease)    Glaucoma    Hyperlipidemia    Hypertension    Leiomyoma of uterus 04/29/2019   Prediabetes    Thickened endometrium 2016   per [facility] records   Vitamin D  deficiency 01/15/2018   Past Surgical History:  Procedure Laterality Date   BREAST BIOPSY Right 12/15/2023   US  RT BREAST BX W LOC DEV 1ST LESION IMG BX SPEC US  GUIDE 12/15/2023 [facility]   CESAREAN SECTION     COLONOSCOPY  2019   DILATION AND CURETTAGE OF UTERUS  2016   Patient Active Problem List   Diagnosis Date Noted   Hyperlipidemia 12/21/2022   Influenza vaccination declined 12/21/2022   Chronic low back pain 12/21/2022   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   Encounter for health maintenance examination in adult 01/05/2022   Screen for STD (sexually transmitted disease) 01/05/2022   Vaccine counseling 01/05/2022   Paresthesia 02/16/2021   Screening for heart disease 02/16/2021   Type 2 diabetes mellitus with hyperglycemia, without long-term current use of insulin (HCC) 07/31/2020   Obesity (BMI 30-39.9) 07/31/2020   Leiomyoma of uterus 04/29/2019   Thickened endometrium    Family history of heart disease 01/11/2019   Vitamin D  deficiency 01/15/2018   Gastroesophageal reflux disease 01/03/2018   Essential hypertension  01/03/2018   Chronic bilateral low back pain without sciatica 09/20/2017   Endometrial polyp 2016    PCP: Alm Gent PA-C  REFERRING PROVIDER: Dorn Ned, MD  REFERRING DIAG: M54.16 (ICD-10-CM) - Radiculopathy, lumbar region  Rationale for Evaluation and Treatment: Rehabilitation  THERAPY DIAG:  Radiculopathy, lumbar region  Other low back pain  ONSET DATE: 2017  SUBJECTIVE:  SUBJECTIVE STATEMENT: 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 She attempted chiropractic tx. Has not tried PT for this in the past. Pt reports that symptoms are present across the low back and radiates into Bil LE to the feet. Pt states that she has been waking up 2 times per night due to pain and is able to fall back asleep. Symptoms range in intensity from 7/10-10/10 in the low back.   PERTINENT HISTORY:  Pt has had chronic low back pain x8 year after a fall. Last imaging recorded 3 years ago and will be having further imaging completed tomorrow.  PAIN:  Are you having pain? Yes: NPRS scale: 7/10 Pain location: low back and BLE  Pain description: radiating, sharp, sore Aggravating factors: standing for 10 mins  Relieving factors: rest  PRECAUTIONS: None  RED FLAGS: None   WEIGHT BEARING RESTRICTIONS: No  FALLS:  Has patient fallen in last 6 months? 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  DIAGNOSTIC FINDINGS:  Scheduled MRI of lumbar spine tomorrow   PATIENT SURVEYS:  Modified Oswestry:  MODIFIED OSWESTRY DISABILITY SCALE  Date: 01/29/24 Score  Pain intensity 4 =  Pain medication provides me with little relief from pain.  2. Personal care (washing, dressing, etc.) 3 =  I need help, but I am able to manage most of my personal care.  3. Lifting 4 = I can lift only very light weights  4. Walking 3 =  Pain prevents me from walking more than  mile.  5. Sitting 3 =  Pain prevents me from sitting more than  hour.  6. Standing 3 =  Pain prevents me from standing more than 1/2 hour.  7. Sleeping 2 =  Even when I take pain medication, I sleep less than 6 hours  8. Social Life 4 =  Pain has restricted my social life to my home  9. Traveling 1 =  I can travel anywhere, but it increases my pain.  10. Employment/ Homemaking 3 = Pain prevents me from doing anything but light duties.  Total 30/50 (60% disability, 40% ability)   Interpretation of scores: Score Category Description  0-20% Minimal Disability The patient can cope with most living activities. Usually no treatment is indicated apart from advice on lifting, sitting and exercise  21-40% Moderate Disability The patient experiences more pain and difficulty with sitting, lifting and standing. Travel and social life are more difficult and they may be disabled from work. Personal care, sexual activity and sleeping are not grossly affected, and the patient can usually be managed by conservative means  41-60% Severe Disability Pain remains the main problem in this group, but activities of daily living are affected. These patients require a detailed investigation  61-80% Crippled Back pain impinges on all aspects of the patient's life. Positive intervention is required   81-100% Bed-bound These patients are either bed-bound or exaggerating their symptoms  Bluford BRAVO, Scantlebury A,  Booth A, et al. Surgery versus conservative management of stable thoracolumbar fracture: the PRESTO feasibility RCT. [place] ([place]): [employer]; 2021 Nov. [facility] Technology Assessment, No. 25.62.) Appendix 3, Oswestry Disability Index category descriptors. Available from: Findjewelers.cz  Minimally Clinically Important Difference (MCID) = 12.8%  COGNITION: Overall cognitive status: Within functional limits for tasks assessed     SENSATION: L2 dermatome on RLE has increased sensitivity with pain noted during testing   POSTURE: rounded shoulders, forward head, and increased thoracic kyphosis  PALPATION: Pt has inc TTP and resting muscle tightness in paraspinals bilaterally  LUMBAR ROM:   AROM eval  Flexion 75% loss  Extension 75% loss  Right lateral flexion Nil loss, inc pain in LLE and L lumbar spine  Left lateral flexion Nil loss  Right rotation   Left rotation    (Blank rows = not tested)   LOWER EXTREMITY MMT:    MMT Right eval Left eval  Hip flexion 4/5 4-/5  Hip extension 4/5 4/5  Hip abduction 4-/5 4-/5  Hip adduction 4/5 4/5  Hip internal rotation    Hip external rotation    Knee flexion 4+/5 4/5  Knee extension 4+/5 4+/5  Ankle dorsiflexion 5/5 5/5  Ankle plantarflexion 5/5 5/5  Ankle inversion    Ankle eversion     (Blank rows = not tested)   GAIT: Distance walked: lobby to treatment area Assistive device utilized: None Level of assistance: Complete Independence Comments: Pt has wide BOS but is able to demonstrate reciprocal pattern with dec velocity, but no noted LOB   TREATMENT DATE: 01/29/2024  [facility] Adult PT Treatment:                                                DATE: 01/29/24 Therapeutic Exercise: Slouch overcorrect x30 reps in sitting, well tolerated through mid range  Seated sciatic nerve  glide x15 RLE, follows produced, not worse response Seated piriformis stretch x30 in sitting to RLE in fig 4 position, symptoms prod NW in lumbar spine and stretch felt in appropriate location HEP developed and provided, reviewed with teach back. Pt educated on relevant anatomy, physiology, mechanics, and pathology related to symptoms.                                                                                                                                  PATIENT EDUCATION:  Education details: Pt educated on relevant anatomy, physiology, pathology, diagnosis, prognosis, progression of care, pain and activity modification related to chronic low back pain  Person educated: Patient Education method: Explanation, Demonstration, Handouts, and teach back Education comprehension: verbalized understanding and returned demonstration  HOME EXERCISE PROGRAM: Access Code: [email or web address] URL: [email or web address] Date: 01/29/2024 Prepared by: Stann Ohara  Exercises - Slouch Overcorrect on Swiss Ball  - 2 x daily -  7 x weekly - 1 sets - 30 reps - 2 hold - Seated Sciatic Nerve Glide With Cervical Motion  - 2 x daily - 7 x weekly - 3 sets - 15 reps - 2 hold - Seated Figure 4 Piriformis Stretch  - 1 x daily - 7 x weekly - 2 sets - 15 reps - 2 hold  ASSESSMENT:  CLINICAL IMPRESSION: Patient is a 63 y.o. F who was seen today for physical therapy evaluation and treatment for chronic low back pain. Pt is limited in her function due to the presence of pain and associated ROM loss in the lumbar spine. She has intermittent pain in bil LE and constant pain in the lumbar spine. She has not been working the last 3 years due to pain. Based on subjective report and objective findings, as well as the consideration of further imaging upcoming, HEP provided with good evidence of tolerance in clinic to address mid range movements of the lumbar spine, elongation to the hip complex, and sciatic nerve  glide. Pt stands to benefit from continued skilled physical therapy to address deficit areas and restore safety with activities and participations at home and in the community.    OBJECTIVE IMPAIRMENTS: decreased activity tolerance, decreased endurance, decreased ROM, decreased strength, impaired flexibility, impaired sensation, postural dysfunction, and pain.   ACTIVITY LIMITATIONS: carrying, lifting, bending, sitting, standing, squatting, and sleeping  PARTICIPATION LIMITATIONS: meal prep, cleaning, laundry, shopping, community activity, and occupation  PERSONAL FACTORS: Time since onset of injury/illness/exacerbation and 1 comorbidity: Diabetes are also affecting patient's functional outcome.   REHAB POTENTIAL: Good  CLINICAL DECISION MAKING: Stable/uncomplicated  EVALUATION COMPLEXITY: Low   GOALS: Goals reviewed with patient? Yes  SHORT TERM GOALS: Target date: 03/04/24   Pt will report compliance with HEP to work towards ind and home management strategies Baseline: Goal status: INITIAL   2.  Pt will score no greater than 15/50 on ODI to demonstrate improved activity tolerance Baseline: 30/50 Goal status: INITIAL   3.  Pt will improve lumbar spine ROM to full and painless in order to demonstrate progress towards activity tolerance and improved function Baseline: limited in flexion and extension ROM, symptoms also reproduced with right side glide in standing  Goal status: INITIAL   4.  Pt will report an average level of pain at rest as 4/10 or less Baseline: 7/10 Goal status: INITIAL      LONG TERM GOALS: Target date: 04/08/24   Pt will score no greater than 10/50 on ODI to demonstrate improved activity tolerance Baseline: 30/50 Goal status: INITIAL   2.  Pt will report no greater than 3/10 pain over 7 consecutive days to demonstrate maintained reduction in symptoms and improved tolerance to activity Baseline: 7/10 baseline at rest Goal status: INITIAL   3.  Pt  will be ind in the management of their symptoms at home and in the community Baseline: uncontrolled symptoms of low back pain and bil LE pain Goal status: INITIAL    PLAN:  PT FREQUENCY: 1-2x/week  PT DURATION: 10 weeks  PLANNED INTERVENTIONS: 97110-Therapeutic exercises, 97530- Therapeutic activity, [id number]- Neuromuscular re-education, 97535- Self Care, [id number]- Manual therapy, [id number]- Electrical stimulation (unattended), 20560 (1-2 muscles), 20561 (3+ muscles)- Dry Needling, Patient/Family education, Cryotherapy, and Moist heat.  PLAN FOR NEXT SESSION: lower quarter strength and stability through functional movement patterns as tolerated, soft tissue mobilization   Stann DELENA Ohara, PT 01/29/2024, 1:27 PM

## 2024-02-05 ENCOUNTER — Encounter: Payer: Self-pay | Admitting: Radiology

## 2024-02-06 ENCOUNTER — Telehealth: Payer: Self-pay | Admitting: Internal Medicine

## 2024-02-06 NOTE — Telephone Encounter (Signed)
 Pt was notified and will call back to schedule once she finds a ride  Needs prenvar 20 and shingles

## 2024-02-06 NOTE — Telephone Encounter (Signed)
 Copied from CRM [id number]. Topic: General - Other >> Feb 06, 2024 11:22 AM Joesph NOVAK wrote: Reason for CRM: patient is calling to schedule her shingles vaccine and pneumococcal vaccine. [phone]

## 2024-02-13 ENCOUNTER — Ambulatory Visit: Attending: Neurosurgery | Admitting: Physical Therapy

## 2024-02-13 ENCOUNTER — Encounter: Payer: Self-pay | Admitting: Physical Therapy

## 2024-02-13 ENCOUNTER — Other Ambulatory Visit: Payer: Self-pay | Admitting: Medical

## 2024-02-13 DIAGNOSIS — I10 Essential (primary) hypertension: Secondary | ICD-10-CM

## 2024-02-13 DIAGNOSIS — M5416 Radiculopathy, lumbar region: Secondary | ICD-10-CM | POA: Diagnosis present

## 2024-02-13 DIAGNOSIS — M5459 Other low back pain: Secondary | ICD-10-CM | POA: Insufficient documentation

## 2024-02-13 DIAGNOSIS — R7309 Other abnormal glucose: Secondary | ICD-10-CM

## 2024-02-13 NOTE — Therapy (Signed)
 OUTPATIENT PHYSICAL THERAPY THORACOLUMBAR TREATMENT   Patient Name: Amy [name] MRN: [id number] DOB:04-07-1960, 63 y.o., female Today's Date: 02/13/2024  END OF SESSION:  PT End of Session - 02/13/24 1605     Visit Number 2    Number of Visits 17    Date for Recertification  04/08/24    PT Start Time 1610    PT Stop Time 1655    PT Time Calculation (min) 45 min    Activity Tolerance Patient tolerated treatment well    Behavior During Therapy [facility] for tasks assessed/performed           Past Medical History:  Diagnosis Date   Abnormal Pap smear of cervix    Endometrial polyp 2016   [facility]   GERD (gastroesophageal reflux disease)    Glaucoma    Hyperlipidemia    Hypertension    Leiomyoma of uterus 04/29/2019   Prediabetes    Thickened endometrium 2016   per [facility] records   Vitamin D  deficiency 01/15/2018   Past Surgical History:  Procedure Laterality Date   BREAST BIOPSY Right 12/15/2023   US  RT BREAST BX W LOC DEV 1ST LESION IMG BX SPEC US  GUIDE 12/15/2023 [facility]   CESAREAN SECTION     COLONOSCOPY  2019   DILATION AND CURETTAGE OF UTERUS  2016   Patient Active Problem List   Diagnosis Date Noted   Hyperlipidemia 12/21/2022   Influenza vaccination declined 12/21/2022   Chronic low back pain 12/21/2022   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   Encounter for health maintenance examination in adult 01/05/2022   Screen for STD (sexually transmitted disease) 01/05/2022   Vaccine counseling 01/05/2022   Paresthesia 02/16/2021   Screening for heart disease 02/16/2021   Type 2 diabetes mellitus with hyperglycemia, without long-term current use of insulin (HCC) 07/31/2020   Obesity (BMI 30-39.9) 07/31/2020   Leiomyoma of uterus 04/29/2019   Thickened endometrium    Family history of heart disease 01/11/2019   Vitamin D  deficiency 01/15/2018   Gastroesophageal reflux disease 01/03/2018   Essential hypertension 01/03/2018   Chronic  bilateral low back pain without sciatica 09/20/2017   Endometrial polyp 2016    PCP: Alm Gent PA-C  REFERRING PROVIDER: Dorn Ned, MD  REFERRING DIAG: M54.16 (ICD-10-CM) - Radiculopathy, lumbar region  Rationale for Evaluation and Treatment: Rehabilitation  THERAPY DIAG:  Radiculopathy, lumbar region  Other low back pain  ONSET DATE: 2017  SUBJECTIVE:  SUBJECTIVE STATEMENT: Pt states that she has been doing well since her previous sesion, reports compliance with HEP. Pt reports 6/10 pain today in her lumbar spine and BLE to the feet. Pt had MRI of the lumbar spine at the end of October and MD follow up 03/22/24.   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 She attempted chiropractic tx. Has not tried PT for this in the past. Pt reports that symptoms are present across the low back and radiates into Bil LE to the feet. Pt states that she has been waking up 2 times per night due to pain and is able to fall back asleep. Symptoms range in intensity from 7/10-10/10 in the low back.   PERTINENT HISTORY:  Pt has had chronic low back pain x8 year after a fall. Last imaging recorded 3 years ago and will be having further imaging completed tomorrow.  PAIN:  Are you having pain? Yes: NPRS scale: 6/10 Pain location: low back and BLE  Pain description: radiating, sharp, sore Aggravating factors: standing for 10 mins  Relieving factors: rest  PRECAUTIONS: None  RED FLAGS: None   WEIGHT BEARING RESTRICTIONS: No  FALLS:  Has patient fallen in last 6 months? 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  DIAGNOSTIC FINDINGS:  Scheduled MRI of lumbar spine tomorrow   PATIENT SURVEYS:  Modified Oswestry:  MODIFIED OSWESTRY DISABILITY SCALE  Date: 01/29/24 Score  Pain intensity 4 =  Pain medication provides me with little relief from pain.  2. Personal care (washing, dressing, etc.) 3 =  I need help, but I am able to manage most of my personal care.  3. Lifting 4 = I can lift only very light weights  4. Walking 3 =  Pain prevents me from walking more than  mile.  5. Sitting 3 =  Pain prevents me from sitting more than  hour.  6. Standing 3 =  Pain prevents me from standing more than 1/2 hour.  7. Sleeping 2 =  Even when I take pain medication, I sleep less than 6 hours  8. Social Life 4 =  Pain has restricted my social life to my home  9. Traveling 1 =  I can travel anywhere, but it increases my pain.  10. Employment/ Homemaking 3 = Pain prevents me from doing anything but light duties.  Total 30/50 (60% disability, 40% ability)   Interpretation of scores: Score Category Description  0-20% Minimal Disability The patient can cope with most living activities. Usually no treatment is indicated apart from advice on lifting, sitting and exercise  21-40% Moderate Disability The patient experiences more pain and difficulty with sitting, lifting and standing. Travel and social life are more difficult and they may be disabled from work. Personal care, sexual activity and sleeping are not grossly affected, and the patient can usually be managed by conservative means  41-60% Severe Disability Pain remains the main  problem in this group, but activities of daily living are affected. These patients require a detailed investigation  61-80% Crippled Back pain impinges on all aspects of the patient's life. Positive intervention is required  81-100% Bed-bound These patients are either bed-bound or exaggerating their symptoms  Bluford FORBES Zoe DELENA Karon DELENA, et al. Surgery versus conservative management of stable thoracolumbar fracture: the PRESTO feasibility RCT. [place] ([place]): [employer]; 2021 Nov. [facility] Technology Assessment, No. 25.62.) Appendix 3, Oswestry Disability Index category descriptors. Available from: [email or web address].cz  Minimally Clinically Important Difference (MCID) = 12.8%  COGNITION: Overall cognitive status: Within functional limits for tasks assessed     SENSATION: L2 dermatome on RLE has increased sensitivity with pain noted during testing   POSTURE: rounded shoulders, forward head, and increased thoracic kyphosis  PALPATION: Pt has inc TTP and resting muscle tightness in paraspinals bilaterally  LUMBAR ROM:   AROM eval 02/13/24  Flexion 75% loss 75% loss, pain in low back  Extension 75% loss 50% loss  Right lateral flexion Nil loss, inc pain in LLE and L lumbar spine Nil loss  Left lateral flexion Nil loss Nil loss  Right rotation    Left rotation     (Blank rows = not tested)   LOWER EXTREMITY MMT:    MMT Right eval Left eval  Hip flexion 4/5 4-/5  Hip extension 4/5 4/5  Hip abduction 4-/5 4-/5  Hip adduction 4/5 4/5  Hip internal rotation    Hip external rotation    Knee flexion 4+/5 4/5  Knee extension 4+/5 4+/5  Ankle dorsiflexion 5/5 5/5  Ankle plantarflexion 5/5 5/5  Ankle inversion    Ankle eversion     (Blank rows = not tested)   GAIT: Distance walked: lobby to treatment area Assistive device utilized: None Level of assistance: Complete Independence Comments: Pt has wide BOS but is able to demonstrate  reciprocal pattern with dec velocity, but no noted LOB   TREATMENT DATE:   [facility] Adult PT Treatment:                                                DATE: 02/13/24 Therapeutic Exercise: Supine hook lying knee sway bilaterally x30 htrough pain free ROM. Pt has improved tolerance to the L vs R Pt completes seated W stretch with physioball x12 cycles for elongation of posterior trunk musculature Repeated lumbar extension in standing x5 reps, dec better baseline pain at rest, noted improved ROM as she progresses, limited tolerance to 5 reps, will begin with this for HEP and progress as tolerated.  Supine bridging 3x8 reps, 2 sec hold in hook lying with improved tolerance and ROM as sets and reps progress Overhead lat stretch 2x15 in supine with dowel through pain free ROM Seated hamstring isometric with physioball 10x10s BLE in short sit    [facility] Adult PT Treatment:                                                DATE: 01/29/24 Therapeutic Exercise: Slouch overcorrect x30 reps in sitting, well tolerated through mid range  Seated sciatic nerve glide x15 RLE, follows produced, not worse response Seated piriformis stretch x30 in sitting to RLE in fig 4 position,  symptoms prod NW in lumbar spine and stretch felt in appropriate location HEP developed and provided, reviewed with teach back. Pt educated on relevant anatomy, physiology, mechanics, and pathology related to symptoms.                                                                                                                                  PATIENT EDUCATION:  Education details: Pt educated on relevant anatomy, physiology, pathology, diagnosis, prognosis, progression of care, pain and activity modification related to chronic low back pain  Person educated: Patient Education method: Explanation, Demonstration, Handouts, and teach back Education comprehension: verbalized understanding and returned demonstration  HOME EXERCISE  PROGRAM: Access Code: [email or web address] URL: [email or web address] Date: 02/13/2024 Prepared by: Stann Ohara  Exercises - Slouch Overcorrect on Swiss Ball  - 2 x daily - 7 x weekly - 1 sets - 30 reps - 2 hold - Seated Sciatic Nerve Glide With Cervical Motion  - 2 x daily - 7 x weekly - 3 sets - 15 reps - 2 hold - Seated Figure 4 Piriformis Stretch  - 1 x daily - 7 x weekly - 2 sets - 15 reps - 2 hold - Supine Bridge  - 1 x daily - 7 x weekly - 3 sets - 8 reps - 2 hold - Standing Lumbar Extension  - 4 x daily - 7 x weekly - 1 sets - 5 reps - 2 hold ASSESSMENT:  CLINICAL IMPRESSION: Pt tolerated session well and has been able to decrease resting level of pain since previous session. She is able to demonstrate a provisional directional preference in the lumbar spine as loaded, dynamic extension principles. Associated soft tissue elongation maintained and progressed in HEP.   Eval:  Patient is a 63 y.o. F who was seen today for physical therapy evaluation and treatment for chronic low back pain. Pt is limited in her function due to the presence of pain and associated ROM loss in the lumbar spine. She has intermittent pain in bil LE and constant pain in the lumbar spine. She has not been working the last 3 years due to pain. Based on subjective report and objective findings, as well as the consideration of further imaging upcoming, HEP provided with good evidence of tolerance in clinic to address mid range movements of the lumbar spine, elongation to the hip complex, and sciatic nerve glide. Pt stands to benefit from continued skilled physical therapy to address deficit areas and restore safety with activities and participations at home and in the community.    OBJECTIVE IMPAIRMENTS: decreased activity tolerance, decreased endurance, decreased ROM, decreased strength, impaired flexibility, impaired sensation, postural dysfunction, and pain.   ACTIVITY LIMITATIONS: carrying, lifting, bending,  sitting, standing, squatting, and sleeping  PARTICIPATION LIMITATIONS: meal prep, cleaning, laundry, shopping, community activity, and occupation  PERSONAL FACTORS: Time since onset of injury/illness/exacerbation and 1 comorbidity: Diabetes are also affecting patient's functional outcome.   REHAB POTENTIAL: Good  CLINICAL DECISION MAKING: Stable/uncomplicated  EVALUATION COMPLEXITY: Low   GOALS: Goals reviewed with patient? Yes  SHORT TERM GOALS: Target date: 03/04/24   Pt will report compliance with HEP to work towards ind and home management strategies Baseline: Goal status: INITIAL   2.  Pt will score no greater than 15/50 on ODI to demonstrate improved activity tolerance Baseline: 30/50 Goal status: INITIAL   3.  Pt will improve lumbar spine ROM to full and painless in order to demonstrate progress towards activity tolerance and improved function Baseline: limited in flexion and extension ROM, symptoms also reproduced with right side glide in standing  Goal status: INITIAL   4.  Pt will report an average level of pain at rest as 4/10 or less Baseline: 7/10 Goal status: INITIAL      LONG TERM GOALS: Target date: 04/08/24   Pt will score no greater than 10/50 on ODI to demonstrate improved activity tolerance Baseline: 30/50 Goal status: INITIAL   2.  Pt will report no greater than 3/10 pain over 7 consecutive days to demonstrate maintained reduction in symptoms and improved tolerance to activity Baseline: 7/10 baseline at rest Goal status: INITIAL   3.  Pt will be ind in the management of their symptoms at home and in the community Baseline: uncontrolled symptoms of low back pain and bil LE pain Goal status: INITIAL    PLAN:  PT FREQUENCY: 1-2x/week  PT DURATION: 10 weeks  PLANNED INTERVENTIONS: 97110-Therapeutic exercises, 97530- Therapeutic activity, [id number]- Neuromuscular re-education, 97535- Self Care, [id number]- Manual therapy, [id number]- Electrical stimulation  (unattended), 20560 (1-2 muscles), 20561 (3+ muscles)- Dry Needling, Patient/Family education, Cryotherapy, and Moist heat.  PLAN FOR NEXT SESSION: lower quarter strength and stability through functional movement patterns as tolerated, soft tissue mobilization   Stann DELENA Ohara, PT 02/13/2024, 5:00 PM

## 2024-02-15 ENCOUNTER — Ambulatory Visit: Admitting: Physical Therapy

## 2024-02-19 NOTE — Therapy (Unsigned)
 OUTPATIENT PHYSICAL THERAPY THORACOLUMBAR TREATMENT   Patient Name: Amy [name] MRN: [id number] DOB:10-04-1960, 63 y.o., female Today's Date: 02/20/2024  END OF SESSION:  PT End of Session - 02/20/24 1617     Visit Number 3    Number of Visits 17    Date for Recertification  04/08/24    PT Start Time 1615    PT Stop Time 1655    PT Time Calculation (min) 40 min    Activity Tolerance Patient tolerated treatment well    Behavior During Therapy [facility] for tasks assessed/performed            Past Medical History:  Diagnosis Date   Abnormal Pap smear of cervix    Endometrial polyp 2016   [facility]   GERD (gastroesophageal reflux disease)    Glaucoma    Hyperlipidemia    Hypertension    Leiomyoma of uterus 04/29/2019   Prediabetes    Thickened endometrium 2016   per [facility] records   Vitamin D  deficiency 01/15/2018   Past Surgical History:  Procedure Laterality Date   BREAST BIOPSY Right 12/15/2023   US  RT BREAST BX W LOC DEV 1ST LESION IMG BX SPEC US  GUIDE 12/15/2023 [facility]   CESAREAN SECTION     COLONOSCOPY  2019   DILATION AND CURETTAGE OF UTERUS  2016   Patient Active Problem List   Diagnosis Date Noted   Hyperlipidemia 12/21/2022   Influenza vaccination declined 12/21/2022   Chronic low back pain 12/21/2022   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   Encounter for health maintenance examination in adult 01/05/2022   Screen for STD (sexually transmitted disease) 01/05/2022   Vaccine counseling 01/05/2022   Paresthesia 02/16/2021   Screening for heart disease 02/16/2021   Type 2 diabetes mellitus with hyperglycemia, without long-term current use of insulin (HCC) 07/31/2020   Obesity (BMI 30-39.9) 07/31/2020   Leiomyoma of uterus 04/29/2019   Thickened endometrium    Family history of heart disease 01/11/2019   Vitamin D  deficiency 01/15/2018   Gastroesophageal reflux disease 01/03/2018   Essential hypertension 01/03/2018   Chronic  bilateral low back pain without sciatica 09/20/2017   Endometrial polyp 2016    PCP: Alm Gent PA-C  REFERRING PROVIDER: Dorn Ned, MD  REFERRING DIAG: M54.16 (ICD-10-CM) - Radiculopathy, lumbar region  Rationale for Evaluation and Treatment: Rehabilitation  THERAPY DIAG:  Radiculopathy, lumbar region  Other low back pain  ONSET DATE: 2017  SUBJECTIVE:  SUBJECTIVE STATEMENT:  Reports unchanging symptoms.  Pain localized to L SI region with radiation into BLEs past knees.  Unable to cite distinct aggravating ore reliving positions/tasks.  Only notes relief with ice and medication.  Has had a recent MRI, results pending.  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 She attempted chiropractic tx. Has not tried PT for this in the past. Pt reports that symptoms are present across the low back and radiates into Bil LE to the feet. Pt states that she has been waking up 2 times per night due to pain and is able to fall back asleep. Symptoms range in intensity from 7/10-10/10 in the low back.   PERTINENT HISTORY:  Pt has had chronic low back pain x8 year after a fall. Last imaging recorded 3 years ago and will be having further imaging completed tomorrow.  PAIN:  Are you having pain? Yes: NPRS scale: 6/10 Pain location: low back and BLE  Pain description: radiating, sharp, sore Aggravating factors: standing for 10 mins  Relieving factors: rest  PRECAUTIONS: None  RED FLAGS: None   WEIGHT BEARING RESTRICTIONS: No  FALLS:  Has patient fallen in last 6 months? 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  DIAGNOSTIC FINDINGS:  Scheduled MRI of lumbar spine tomorrow   PATIENT SURVEYS:  Modified Oswestry:  MODIFIED OSWESTRY DISABILITY SCALE  Date: 01/29/24 Score  Pain intensity 4 =  Pain medication provides me with little relief from pain.  2. Personal care (washing, dressing, etc.) 3 =  I need help, but I am able to manage most of my personal care.  3. Lifting 4 = I can lift only very light weights  4. Walking 3 =  Pain prevents me from walking more than  mile.  5. Sitting 3 =  Pain prevents me from sitting more than  hour.  6. Standing 3 =  Pain prevents me from standing more than 1/2 hour.  7. Sleeping 2 =  Even when I take pain medication, I sleep less than 6 hours  8. Social Life 4 =  Pain has restricted my social life to my home  9. Traveling 1 =  I can travel anywhere, but it increases my pain.  10. Employment/ Homemaking 3 = Pain prevents me from doing anything but light duties.  Total 30/50 (60% disability, 40% ability)   Interpretation of scores: Score Category Description  0-20% Minimal Disability The patient can cope with most living activities. Usually no treatment is indicated apart from advice on lifting, sitting and exercise  21-40% Moderate Disability The patient experiences more pain and difficulty with sitting, lifting and standing. Travel and social life are more difficult and they may be disabled from work. Personal care, sexual activity and sleeping are not grossly affected, and the patient can usually be managed by conservative means  41-60% Severe Disability Pain remains the main  problem in this group, but activities  of daily living are affected. These patients require a detailed investigation  61-80% Crippled Back pain impinges on all aspects of the patient's life. Positive intervention is required  81-100% Bed-bound These patients are either bed-bound or exaggerating their symptoms  Bluford FORBES Zoe DELENA Karon DELENA, et al. Surgery versus conservative management of stable thoracolumbar fracture: the PRESTO feasibility RCT. [place] ([place]): [employer]; 2021 Nov. [facility] Technology Assessment, No. 25.62.) Appendix 3, Oswestry Disability Index category descriptors. Available from: Findjewelers.cz  Minimally Clinically Important Difference (MCID) = 12.8%  COGNITION: Overall cognitive status: Within functional limits for tasks assessed     SENSATION: L2 dermatome on RLE has increased sensitivity with pain noted during testing   POSTURE: rounded shoulders, forward head, and increased thoracic kyphosis  PALPATION: Pt has inc TTP and resting muscle tightness in paraspinals bilaterally  LUMBAR ROM:   AROM eval 02/13/24  Flexion 75% loss 75% loss, pain in low back  Extension 75% loss 50% loss  Right lateral flexion Nil loss, inc pain in LLE and L lumbar spine Nil loss  Left lateral flexion Nil loss Nil loss  Right rotation    Left rotation     (Blank rows = not tested)   LOWER EXTREMITY MMT:    MMT Right eval Left eval  Hip flexion 4/5 4-/5  Hip extension 4/5 4/5  Hip abduction 4-/5 4-/5  Hip adduction 4/5 4/5  Hip internal rotation    Hip external rotation    Knee flexion 4+/5 4/5  Knee extension 4+/5 4+/5  Ankle dorsiflexion 5/5 5/5  Ankle plantarflexion 5/5 5/5  Ankle inversion    Ankle eversion     (Blank rows = not tested)   GAIT: Distance walked: lobby to treatment area Assistive device utilized: None Level of assistance: Complete Independence Comments: Pt has wide BOS but is able to demonstrate  reciprocal pattern with dec velocity, but no noted LOB   TREATMENT DATE:  [facility] Adult PT Treatment:                                                DATE: 02/20/24 Therapeutic Exercise: Nustep L2 6 min Neuromuscular re-ed: Supine hip fallouts GTB 15x B, 15/15 Seated hip toss 10/10 Seated chops 10/10 Therapeutic Activity: Seated hamstring stretch 30s x2 B Supine QL stretch 30s x2 B   [facility] Adult PT Treatment:                                                DATE: 02/13/24 Therapeutic Exercise: Supine hook lying knee sway bilaterally x30 htrough pain free ROM. Pt has improved tolerance to the L vs R Pt completes seated W stretch with physioball x12 cycles for elongation of posterior trunk musculature Repeated lumbar extension in standing x5 reps, dec better baseline pain at rest, noted improved ROM as she progresses, limited tolerance to 5 reps, will begin with this for HEP and progress as tolerated.  Supine bridging 3x8 reps, 2 sec hold in hook lying with improved tolerance and ROM as sets and reps progress Overhead lat stretch 2x15 in supine with dowel through pain free ROM Seated hamstring isometric with physioball 10x10s BLE in short sit    [facility] Adult PT Treatment:  DATE: 01/29/24 Therapeutic Exercise: Slouch overcorrect x30 reps in sitting, well tolerated through mid range  Seated sciatic nerve glide x15 RLE, follows produced, not worse response Seated piriformis stretch x30 in sitting to RLE in fig 4 position, symptoms prod NW in lumbar spine and stretch felt in appropriate location HEP developed and provided, reviewed with teach back. Pt educated on relevant anatomy, physiology, mechanics, and pathology related to symptoms.                                                                                                                                  PATIENT EDUCATION:  Education details: Pt educated on relevant anatomy, physiology,  pathology, diagnosis, prognosis, progression of care, pain and activity modification related to chronic low back pain  Person educated: Patient Education method: Explanation, Demonstration, Handouts, and teach back Education comprehension: verbalized understanding and returned demonstration  HOME EXERCISE PROGRAM: Access Code: [email or web address] URL: [email or web address] Date: 02/13/2024 Prepared by: Stann Ohara  Exercises - Slouch Overcorrect on Swiss Ball  - 2 x daily - 7 x weekly - 1 sets - 30 reps - 2 hold - Seated Sciatic Nerve Glide With Cervical Motion  - 2 x daily - 7 x weekly - 3 sets - 15 reps - 2 hold - Seated Figure 4 Piriformis Stretch  - 1 x daily - 7 x weekly - 2 sets - 15 reps - 2 hold - Supine Bridge  - 1 x daily - 7 x weekly - 3 sets - 8 reps - 2 hold - Standing Lumbar Extension  - 4 x daily - 7 x weekly - 1 sets - 5 reps - 2 hold ASSESSMENT:  CLINICAL IMPRESSION:  Focus of session was stretching tasks following aerobic w/u.  Incorporated global stretching of hamstrings, QL and piriformis B.  Added lumbosacral strengthening against t-band in supine position.  All movements slow and guarded indicating chronic soft tissue restrictions.  Increased discomfort in supine due to limited intersegmental spinal mobility.  Introduced seated rotational and PNF patterns to facilitate mobility and functional movement patterns.  Eval:  Patient is a 63 y.o. F who was seen today for physical therapy evaluation and treatment for chronic low back pain. Pt is limited in her function due to the presence of pain and associated ROM loss in the lumbar spine. She has intermittent pain in bil LE and constant pain in the lumbar spine. She has not been working the last 3 years due to pain. Based on subjective report and objective findings, as well as the consideration of further imaging upcoming, HEP provided with good evidence of tolerance in clinic to address mid range movements of the lumbar  spine, elongation to the hip complex, and sciatic nerve glide. Pt stands to benefit from continued skilled physical therapy to address deficit areas and restore safety with activities and participations at home and in the community.    OBJECTIVE IMPAIRMENTS: decreased activity tolerance, decreased endurance,  decreased ROM, decreased strength, impaired flexibility, impaired sensation, postural dysfunction, and pain.   ACTIVITY LIMITATIONS: carrying, lifting, bending, sitting, standing, squatting, and sleeping  PARTICIPATION LIMITATIONS: meal prep, cleaning, laundry, shopping, community activity, and occupation  PERSONAL FACTORS: Time since onset of injury/illness/exacerbation and 1 comorbidity: Diabetes are also affecting patient's functional outcome.   REHAB POTENTIAL: Good  CLINICAL DECISION MAKING: Stable/uncomplicated  EVALUATION COMPLEXITY: Low   GOALS: Goals reviewed with patient? Yes  SHORT TERM GOALS: Target date: 03/04/24   Pt will report compliance with HEP to work towards ind and home management strategies Baseline: Goal status: INITIAL   2.  Pt will score no greater than 15/50 on ODI to demonstrate improved activity tolerance Baseline: 30/50 Goal status: INITIAL   3.  Pt will improve lumbar spine ROM to full and painless in order to demonstrate progress towards activity tolerance and improved function Baseline: limited in flexion and extension ROM, symptoms also reproduced with right side glide in standing  Goal status: INITIAL   4.  Pt will report an average level of pain at rest as 4/10 or less Baseline: 7/10 Goal status: INITIAL      LONG TERM GOALS: Target date: 04/08/24   Pt will score no greater than 10/50 on ODI to demonstrate improved activity tolerance Baseline: 30/50 Goal status: INITIAL   2.  Pt will report no greater than 3/10 pain over 7 consecutive days to demonstrate maintained reduction in symptoms and improved tolerance to activity Baseline:  7/10 baseline at rest Goal status: INITIAL   3.  Pt will be ind in the management of their symptoms at home and in the community Baseline: uncontrolled symptoms of low back pain and bil LE pain Goal status: INITIAL    PLAN:  PT FREQUENCY: 1-2x/week  PT DURATION: 10 weeks  PLANNED INTERVENTIONS: 97110-Therapeutic exercises, 97530- Therapeutic activity, [id number]- Neuromuscular re-education, 97535- Self Care, [id number]- Manual therapy, [id number]- Electrical stimulation (unattended), 20560 (1-2 muscles), 20561 (3+ muscles)- Dry Needling, Patient/Family education, Cryotherapy, and Moist heat.  PLAN FOR NEXT SESSION: lower quarter strength and stability through functional movement patterns as tolerated, soft tissue mobilization   Reyes CHRISTELLA Kohut, PT 02/20/2024, 4:56 PM

## 2024-02-20 ENCOUNTER — Ambulatory Visit

## 2024-02-20 DIAGNOSIS — M5459 Other low back pain: Secondary | ICD-10-CM

## 2024-02-20 DIAGNOSIS — M5416 Radiculopathy, lumbar region: Secondary | ICD-10-CM

## 2024-02-21 NOTE — Therapy (Unsigned)
 OUTPATIENT PHYSICAL THERAPY THORACOLUMBAR TREATMENT   Patient Name: Amy [name] MRN: [id number] DOB:03/25/1961, 63 y.o., female Today's Date: 02/22/2024  END OF SESSION:  PT End of Session - 02/22/24 1616     Visit Number 4    Number of Visits 17    Date for Recertification  04/08/24    Authorization Type [place] [employer]    Authorization Time Period approved 6 PT visits from 02/20/24-04/19/24    Authorization - Visit Number 4    Authorization - Number of Visits 6    PT Start Time 1615    PT Stop Time 1655    PT Time Calculation (min) 40 min    Activity Tolerance Patient tolerated treatment well    Behavior During Therapy [facility] for tasks assessed/performed             Past Medical History:  Diagnosis Date   Abnormal Pap smear of cervix    Endometrial polyp 2016   [facility]   GERD (gastroesophageal reflux disease)    Glaucoma    Hyperlipidemia    Hypertension    Leiomyoma of uterus 04/29/2019   Prediabetes    Thickened endometrium 2016   per [facility] records   Vitamin D  deficiency 01/15/2018   Past Surgical History:  Procedure Laterality Date   BREAST BIOPSY Right 12/15/2023   US  RT BREAST BX W LOC DEV 1ST LESION IMG BX SPEC US  GUIDE 12/15/2023 [facility]AMMOGRAPHY   CESAREAN SECTION     COLONOSCOPY  2019   DILATION AND CURETTAGE OF UTERUS  2016   Patient Active Problem List   Diagnosis Date Noted   Hyperlipidemia 12/21/2022   Influenza vaccination declined 12/21/2022   Chronic low back pain 12/21/2022   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   Encounter for health maintenance examination in adult 01/05/2022   Screen for STD (sexually transmitted disease) 01/05/2022   Vaccine counseling 01/05/2022   Paresthesia 02/16/2021   Screening for heart disease 02/16/2021   Type 2 diabetes mellitus with hyperglycemia, without long-term current use of insulin (HCC) 07/31/2020   Obesity (BMI 30-39.9) 07/31/2020   Leiomyoma of uterus 04/29/2019   Thickened  endometrium    Family history of heart disease 01/11/2019   Vitamin D  deficiency 01/15/2018   Gastroesophageal reflux disease 01/03/2018   Essential hypertension 01/03/2018   Chronic bilateral low back pain without sciatica 09/20/2017   Endometrial polyp 2016    PCP: Alm Gent PA-C  REFERRING PROVIDER: Dorn Ned, MD  REFERRING DIAG: M54.16 (ICD-10-CM) - Radiculopathy, lumbar region  Rationale for Evaluation and Treatment: Rehabilitation  THERAPY DIAG:  Radiculopathy, lumbar region  Other low back pain  ONSET DATE: 2017  SUBJECTIVE:  SUBJECTIVE STATEMENT:  No real change to note.  Symptoms notable when standing for prolonged periods such as dishwashing.  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 She attempted chiropractic tx. Has not tried PT for this in the past. Pt reports that symptoms are present across the low back and radiates into Bil LE to the feet. Pt states that she has been waking up 2 times per night due to pain and is able to fall back asleep. Symptoms range in intensity from 7/10-10/10 in the low back.   PERTINENT HISTORY:  Pt has had chronic low back pain x8 year after a fall. Last imaging recorded 3 years ago and will be having further imaging completed tomorrow.  PAIN:  Are you having pain? Yes: NPRS scale: 6/10 Pain location: low back and BLE  Pain description: radiating, sharp, sore Aggravating factors: standing for 10 mins  Relieving factors: rest  PRECAUTIONS: None  RED FLAGS: None   WEIGHT BEARING RESTRICTIONS: No  FALLS:  Has patient fallen in last 6 months?  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  DIAGNOSTIC FINDINGS:  Scheduled MRI of lumbar spine tomorrow   PATIENT SURVEYS:  Modified Oswestry:  MODIFIED OSWESTRY DISABILITY SCALE  Date: 01/29/24 Score  Pain intensity 4 =  Pain medication provides me with little relief from pain.  2. Personal care (washing, dressing, etc.) 3 =  I need help, but I am able to manage most of my personal care.  3. Lifting 4 = I can lift only very light weights  4. Walking 3 =  Pain prevents me from walking more than  mile.  5. Sitting 3 =  Pain prevents me from sitting more than  hour.  6. Standing 3 =  Pain prevents me from standing more than 1/2 hour.  7. Sleeping 2 =  Even when I take pain medication, I sleep less than 6 hours  8. Social Life 4 =  Pain has restricted my social life to my home  9. Traveling 1 =  I can travel anywhere, but it increases my pain.  10. Employment/ Homemaking 3 = Pain prevents me from doing anything but light duties.  Total 30/50 (60% disability, 40% ability)   Interpretation of scores: Score Category Description  0-20% Minimal Disability The patient can cope with most living activities. Usually no treatment is indicated apart from advice on lifting, sitting and exercise  21-40% Moderate Disability The patient experiences more pain and difficulty with sitting, lifting and standing. Travel and social life are more difficult and they may be disabled from work. Personal care, sexual activity and sleeping are not grossly affected, and the patient can usually be managed by conservative means   41-60% Severe Disability Pain remains the main problem in this group, but activities of daily living are affected. These patients require a detailed investigation  61-80% Crippled Back pain impinges on all aspects of the patient's life. Positive  intervention is required  81-100% Bed-bound These patients are either bed-bound or exaggerating their symptoms  Bluford FORBES Zoe DELENA Karon DELENA, et al. Surgery versus conservative management of stable thoracolumbar fracture: the PRESTO feasibility RCT. [place] ([place]): [employer]; 2021 Nov. [facility] Technology Assessment, No. 25.62.) Appendix 3, Oswestry Disability Index category descriptors. Available from: Findjewelers.cz  Minimally Clinically Important Difference (MCID) = 12.8%  COGNITION: Overall cognitive status: Within functional limits for tasks assessed     SENSATION: L2 dermatome on RLE has increased sensitivity with pain noted during testing   POSTURE: rounded shoulders, forward head, and increased thoracic kyphosis  PALPATION: Pt has inc TTP and resting muscle tightness in paraspinals bilaterally  LUMBAR ROM:   AROM eval 02/13/24  Flexion 75% loss 75% loss, pain in low back  Extension 75% loss 50% loss  Right lateral flexion Nil loss, inc pain in LLE and L lumbar spine Nil loss  Left lateral flexion Nil loss Nil loss  Right rotation    Left rotation     (Blank rows = not tested)   LOWER EXTREMITY MMT:    MMT Right eval Left eval  Hip flexion 4/5 4-/5  Hip extension 4/5 4/5  Hip abduction 4-/5 4-/5  Hip adduction 4/5 4/5  Hip internal rotation    Hip external rotation    Knee flexion 4+/5 4/5  Knee extension 4+/5 4+/5  Ankle dorsiflexion 5/5 5/5  Ankle plantarflexion 5/5 5/5  Ankle inversion    Ankle eversion     (Blank rows = not tested)   GAIT: Distance walked: lobby to treatment area Assistive device utilized: None Level of assistance: Complete Independence Comments:  Pt has wide BOS but is able to demonstrate reciprocal pattern with dec velocity, but no noted LOB   TREATMENT DATE:  [facility] Adult PT Treatment:                                                DATE: 02/22/24 Therapeutic Exercise: Nustep L4 8 min Neuromuscular re-ed: Supine hip fallouts BluTB 15x B, 15/15 Seated hip toss 15/15 Seated chops 15/15 Therapeutic Activity: Seated hamstring stertch 30s x2 B Supine QL stretch 30s x2 B Open book 10/10  [facility] Adult PT Treatment:                                                DATE: 02/20/24 Therapeutic Exercise: Nustep L2 6 min Neuromuscular re-ed: Supine hip fallouts GTB 15x B, 15/15 Seated hip toss 10/10 Seated chops 10/10 Therapeutic Activity: Seated hamstring stretch 30s x2 B Supine QL stretch 30s x2 B   [facility] Adult PT Treatment:                                                DATE: 02/13/24 Therapeutic Exercise: Supine hook lying knee sway bilaterally x30 htrough pain free ROM. Pt has improved tolerance to the L vs R Pt completes seated W stretch with physioball x12 cycles for elongation of posterior trunk musculature Repeated lumbar extension in standing x5 reps, dec better baseline pain at rest, noted improved ROM as she  progresses, limited tolerance to 5 reps, will begin with this for HEP and progress as tolerated.  Supine bridging 3x8 reps, 2 sec hold in hook lying with improved tolerance and ROM as sets and reps progress Overhead lat stretch 2x15 in supine with dowel through pain free ROM Seated hamstring isometric with physioball 10x10s BLE in short sit    [facility] Adult PT Treatment:                                                DATE: 01/29/24 Therapeutic Exercise: Slouch overcorrect x30 reps in sitting, well tolerated through mid range  Seated sciatic nerve glide x15 RLE, follows produced, not worse response Seated piriformis stretch x30 in sitting to RLE in fig 4 position, symptoms prod NW in lumbar spine and stretch felt in  appropriate location HEP developed and provided, reviewed with teach back. Pt educated on relevant anatomy, physiology, mechanics, and pathology related to symptoms.                                                                                                                                  PATIENT EDUCATION:  Education details: Pt educated on relevant anatomy, physiology, pathology, diagnosis, prognosis, progression of care, pain and activity modification related to chronic low back pain  Person educated: Patient Education method: Explanation, Demonstration, Handouts, and teach back Education comprehension: verbalized understanding and returned demonstration  HOME EXERCISE PROGRAM: Access Code: [email or web address] URL: [email or web address] Date: 02/13/2024 Prepared by: Stann Ohara  Exercises - Slouch Overcorrect on Swiss Ball  - 2 x daily - 7 x weekly - 1 sets - 30 reps - 2 hold - Seated Sciatic Nerve Glide With Cervical Motion  - 2 x daily - 7 x weekly - 3 sets - 15 reps - 2 hold - Seated Figure 4 Piriformis Stretch  - 1 x daily - 7 x weekly - 2 sets - 15 reps - 2 hold - Supine Bridge  - 1 x daily - 7 x weekly - 3 sets - 8 reps - 2 hold - Standing Lumbar Extension  - 4 x daily - 7 x weekly - 1 sets - 5 reps - 2 hold ASSESSMENT:  CLINICAL IMPRESSION:  Added resistance to aerobic component and continued into stretching tasks.  Added open book for rotational component, advanced resistance on lumbosacral strengthening tasks and increased reps on PNF trunk patterns to facilitate functional mobility patterns.  Eval:  Patient is a 63 y.o. F who was seen today for physical therapy evaluation and treatment for chronic low back pain. Pt is limited in her function due to the presence of pain and associated ROM loss in the lumbar spine. She has intermittent pain in bil LE and constant pain in the lumbar spine. She has not been working the last 3  years due to pain. Based on subjective  report and objective findings, as well as the consideration of further imaging upcoming, HEP provided with good evidence of tolerance in clinic to address mid range movements of the lumbar spine, elongation to the hip complex, and sciatic nerve glide. Pt stands to benefit from continued skilled physical therapy to address deficit areas and restore safety with activities and participations at home and in the community.    OBJECTIVE IMPAIRMENTS: decreased activity tolerance, decreased endurance, decreased ROM, decreased strength, impaired flexibility, impaired sensation, postural dysfunction, and pain.   ACTIVITY LIMITATIONS: carrying, lifting, bending, sitting, standing, squatting, and sleeping  PARTICIPATION LIMITATIONS: meal prep, cleaning, laundry, shopping, community activity, and occupation  PERSONAL FACTORS: Time since onset of injury/illness/exacerbation and 1 comorbidity: Diabetes are also affecting patient's functional outcome.   REHAB POTENTIAL: Good  CLINICAL DECISION MAKING: Stable/uncomplicated  EVALUATION COMPLEXITY: Low   GOALS: Goals reviewed with patient? Yes  SHORT TERM GOALS: Target date: 03/04/24   Pt will report compliance with HEP to work towards ind and home management strategies Baseline: Goal status: INITIAL   2.  Pt will score no greater than 15/50 on ODI to demonstrate improved activity tolerance Baseline: 30/50 Goal status: INITIAL   3.  Pt will improve lumbar spine ROM to full and painless in order to demonstrate progress towards activity tolerance and improved function Baseline: limited in flexion and extension ROM, symptoms also reproduced with right side glide in standing  Goal status: INITIAL   4.  Pt will report an average level of pain at rest as 4/10 or less Baseline: 7/10 Goal status: INITIAL      LONG TERM GOALS: Target date: 04/08/24   Pt will score no greater than 10/50 on ODI to demonstrate improved activity tolerance Baseline:  30/50 Goal status: INITIAL   2.  Pt will report no greater than 3/10 pain over 7 consecutive days to demonstrate maintained reduction in symptoms and improved tolerance to activity Baseline: 7/10 baseline at rest Goal status: INITIAL   3.  Pt will be ind in the management of their symptoms at home and in the community Baseline: uncontrolled symptoms of low back pain and bil LE pain Goal status: INITIAL    PLAN:  PT FREQUENCY: 1-2x/week  PT DURATION: 10 weeks  PLANNED INTERVENTIONS: 97110-Therapeutic exercises, 97530- Therapeutic activity, [id number]- Neuromuscular re-education, 97535- Self Care, [id number]- Manual therapy, [id number]- Electrical stimulation (unattended), 20560 (1-2 muscles), 20561 (3+ muscles)- Dry Needling, Patient/Family education, Cryotherapy, and Moist heat.  PLAN FOR NEXT SESSION: lower quarter strength and stability through functional movement patterns as tolerated, soft tissue mobilization   Reyes CHRISTELLA Kohut, PT 02/22/2024, 5:22 PM

## 2024-02-22 ENCOUNTER — Ambulatory Visit

## 2024-02-22 DIAGNOSIS — M5459 Other low back pain: Secondary | ICD-10-CM

## 2024-02-22 DIAGNOSIS — M5416 Radiculopathy, lumbar region: Secondary | ICD-10-CM | POA: Diagnosis not present

## 2024-02-27 ENCOUNTER — Ambulatory Visit: Admitting: Physical Therapy

## 2024-03-12 ENCOUNTER — Ambulatory Visit

## 2024-03-12 ENCOUNTER — Other Ambulatory Visit

## 2024-03-19 ENCOUNTER — Ambulatory Visit: Admitting: Family Medicine

## 2024-03-19 ENCOUNTER — Encounter: Payer: Self-pay | Admitting: Family Medicine

## 2024-03-19 VITALS — BP 114/70 | HR 64 | Ht 62.0 in | Wt 200.8 lb

## 2024-03-19 DIAGNOSIS — Z23 Encounter for immunization: Secondary | ICD-10-CM

## 2024-03-19 DIAGNOSIS — S91311A Laceration without foreign body, right foot, initial encounter: Secondary | ICD-10-CM

## 2024-03-19 DIAGNOSIS — E1165 Type 2 diabetes mellitus with hyperglycemia: Secondary | ICD-10-CM

## 2024-03-19 NOTE — Progress Notes (Signed)
° °  Subjective:    Patient ID: Amy [name], female    DOB: 1960/11/03, 63 y.o.   MRN: [id number]  HPI She states that while trying to shave some of the excess skin off of her right medial heel she sustained a slight laceration with the blade and is here to make sure it is not infected as she does have an underlying history of diabetes.  She also needs an update on her immunizations.   Review of Systems     Objective:    Physical Exam  Exam of the right heel shows that he 1 cm healing lesion with no erythema warmth or tenderness.      Assessment & Plan:  Laceration of right heel, initial encounter  Need for vaccination against Streptococcus pneumoniae - Plan: Pneumococcal conjugate vaccine 20-valent (Prevnar 20)  Need for shingles vaccine - Plan: Varicella-zoster vaccine subcutaneous  Type 2 diabetes mellitus with hyperglycemia, without long-term current use of insulin (HCC) The lesion is healing with no evidence of infection.  I reinforced the fact that although she has diabetes there is no evidence of infection and continue to take conservative care of this.  Her immunizations were also updated.

## 2024-03-22 ENCOUNTER — Telehealth: Payer: Self-pay | Admitting: Medical

## 2024-03-22 NOTE — Telephone Encounter (Signed)
 Surgical clearance form from [facility] received and sent back in folder

## 2024-03-25 ENCOUNTER — Encounter: Payer: Self-pay | Admitting: Family Medicine

## 2024-03-25 ENCOUNTER — Ambulatory Visit: Admitting: Family Medicine

## 2024-03-25 VITALS — BP 124/70 | HR 60 | Temp 98.2°F | Ht 62.0 in | Wt 194.0 lb

## 2024-03-25 DIAGNOSIS — L03114 Cellulitis of left upper limb: Secondary | ICD-10-CM | POA: Diagnosis not present

## 2024-03-25 DIAGNOSIS — T50A95A Adverse effect of other bacterial vaccines, initial encounter: Secondary | ICD-10-CM | POA: Diagnosis not present

## 2024-03-25 DIAGNOSIS — T50Z95A Adverse effect of other vaccines and biological substances, initial encounter: Secondary | ICD-10-CM

## 2024-03-25 MED ORDER — CEPHALEXIN 500 MG PO CAPS: 500.0000 mg | ORAL_CAPSULE | Freq: Three times a day (TID) | ORAL | 0 refills | Status: AC

## 2024-03-25 NOTE — Progress Notes (Signed)
 Chief Complaint  Patient presents with   injection reaction    Had shingrix  and prevnar on Tues 12/16, one in L and one in R arm. Both are itching and painful. L worse than right and hot to the touch.    12/16 she received the Prevnar-20 and Shingrix  vaccines, one vaccine in each arm. The next evening she felt chills, shaking, which persisted into 12/18. She didn't check her temperature, but thinks she could have had a fever. Thursday 12/18 the arms started itching.  Last night the L arm was painful, couldn't lift her arm. She took 200 mg of ibuprofen at 3pm and at at 10pm, which helped with the discomfort. She still has itching and discomfort. She hasn't taken anything for the itching. She used vaseline, which didn't help.    PMH, PSH, SH reviewed  HTN, DM  Outpatient Encounter Medications as of 03/25/2024  Medication Sig Note   ACCU-CHEK GUIDE TEST test strip TEST BLOOD GLUCOSE 1-2 TIMES DAILY.    amLODipine  (NORVASC ) 10 MG tablet TAKE 1 TABLET BY MOUTH EVERY DAY    Blood Glucose Monitoring Suppl DEVI Test 1-2 times day. Needs accu-chek    dorzolamide-timolol (COSOPT) 22.3-6.8 MG/ML ophthalmic solution 1 drop 2 (two) times daily.    hydrochlorothiazide  (HYDRODIURIL ) 12.5 MG tablet Take 1 tablet (12.5 mg total) by mouth daily.    ibuprofen (ADVIL) 100 MG/5ML suspension Take 200 mg by mouth every 4 (four) hours as needed. 03/25/2024: Took on yesterday at 3pm and another at bedtime yesterday.   Lancet Device MISC 1-2 times daily    Lancets Misc. MISC 1-2 times a day    LUMIGAN 0.01 % SOLN     metFORMIN  (GLUCOPHAGE -XR) 500 MG 24 hr tablet Take 2 tablets (1,000 mg total) by mouth daily with breakfast.    Multiple Vitamin (MULTIVITAMIN) tablet Take 1 tablet by mouth daily.    pregabalin  (LYRICA ) 25 MG capsule Take 1 capsule (25 mg total) by mouth 2 (two) times daily.    rosuvastatin  (CRESTOR ) 5 MG tablet TAKE 1 TABLET (5 MG TOTAL) BY MOUTH DAILY.    No facility-administered encounter  medications on file as of 03/25/2024.   Allergies[1]   ROS: Fever, chills, redness/swelling and itching of L>R upper arm, per HPI. No URI symptoms, chest pain, shortness of breath. No other rashes, bleeding or bruising. No n/v/d   PHYSICAL EXAM:  BP 124/70   Pulse 60   Temp 98.2 F (36.8 C) (Tympanic)   Ht 5' 2 (1.575 m)   Wt 194 lb (88 kg)   LMP 04/04/2012 Comment: not sexually active  BMI 35.48 kg/m   Pleasant female, in no distress HEENT: conjunctiva and sclera are clear, EOMI Neck: no lymphadenopathy Heart: regular rate and rhythm Lungs: clear bilaterally Abdomen: soft, nontender  Extremities;skin: L deltoid area--12 x 7 cm area of slight erythema/hyperpigmentation There is a 2 x 1 cm more indurated/firm area at the superior aspect within this area (central). This entire area is somewhat tender to touch, more tender at the harder area. There is an area of erythema, possible streaks, extending anterior/medial to the large area, which is also very tender. She is very tender all the way to the axilla, diffusely.  R deltoid area--7.5 x 3 cm area of slight hyperpigmentation. This is smaller, flatter, only mildly tender. No surrounding erythema, streaks, or axillary lymphadenopathy or tenderness         ASSESSMENT/PLAN:  Cellulitis of left upper extremity - local reaction to vaccine, with suspected  infection due to streaking, pain and axillary pain. Areas marked. to contact us  if expanding/worsening - Plan: cephALEXin  (KEFLEX ) 500 MG capsule  Local reaction to pneumococcal vaccine, initial encounter - unclear which arm this was given in.  Both are local reactions, not allergic reactions. Reassurred  Adverse effect of vaccine, initial encounter - to shingrix .  Unclear which arm this was given in.  Discussed the likelihood of recurrent local reaction with 2nd dose, and how to treat  You have had a local reaction to your vaccines (causing fever, chills, redness,  swelling and itching). The one on the right is improving. The one on the left has an area of redness and pain into the armpit that makes me suspect that it might be getting an infection. We are going to treat you with an antibiotic. Please take this as directed. We are marking the area of redness with pen.  You should see this start to improve.  If you see the redness and swelling continue to get larger, then you need to have the antibiotic changed, and get re-evaluated.  You can continue to use ibuprofen and/or tylenol, if needed for any pain. Try using cool compresses for the pain, swelling and itching. You can use hydrocortisone cream twice daily if needed for itching. An antihistamine such as claritin, zyrtec or allegra might also help with the itching and swelling . I recommend taking one of these once daily.  You can also use benadryl (diphenhydramine) at night, if needed (this may make you sleepy, so only use this at night).  I spent 31 minutes dedicated to the care of this patient, including pre-visit review of records, face to face time, post-visit ordering of testing and documentation.      [1]  Allergies Allergen Reactions   Atorvastatin      myalgia   Chloroquine     Itching

## 2024-03-25 NOTE — Patient Instructions (Addendum)
 You have had a local reaction to your vaccines (causing fever, chills, redness, swelling and itching). The one on the right is improving. The one on the left has an area of redness and pain into the armpit that makes me suspect that it might be getting an infection. We are going to treat you with an antibiotic. Please take this as directed. We are marking the area of redness with pen.  You should see this start to improve.  If you see the redness and swelling continue to get larger, then you need to have the antibiotic changed, and get re-evaluated.  You can continue to use ibuprofen and/or tylenol, if needed for any pain. Try using cool compresses for the pain, swelling and itching. You can use hydrocortisone cream twice daily if needed for itching. An antihistamine such as claritin, zyrtec or allegra might also help with the itching and swelling . I recommend taking one of these once daily.  You can also use benadryl (diphenhydramine) at night, if needed (this may make you sleepy, so only use this at night).   You are supposed to have a second dose of the shingles vaccine 2 months after the first one.  This is not an allergic reaction, but a LOCAL reaction (with subsequent infection).  Anticipate having a similar reaction with the next dose-- Use the cool compresses and the zyrtec or claritin once daily right from the start. Technically you need both doses to get the full benefit from the vaccine (which is 90% prevention of shingles).

## 2024-03-25 NOTE — Therapy (Unsigned)
 " OUTPATIENT PHYSICAL THERAPY THORACOLUMBAR TREATMENT   Patient Name: Amy [name] MRN: [id number] DOB:11-23-1960, 63 y.o., female Today's Date: 03/26/2024  END OF SESSION:  PT End of Session - 03/26/24 1606     Visit Number 5    Number of Visits 17    Date for Recertification  04/08/24    Authorization Type [place] [facility]    Authorization Time Period approved 6 PT visits from 02/20/24-04/19/24    Authorization - Visit Number 5    Authorization - Number of Visits 6    PT Start Time 1615    PT Stop Time 1655    PT Time Calculation (min) 40 min    Activity Tolerance Patient tolerated treatment well    Behavior During Therapy [facility] for tasks assessed/performed              Past Medical History:  Diagnosis Date   Abnormal Pap smear of cervix    Endometrial polyp 2016   [facility]   GERD (gastroesophageal reflux disease)    Glaucoma    Hyperlipidemia    Hypertension    Leiomyoma of uterus 04/29/2019   Prediabetes    Thickened endometrium 2016   per [facility] records   Vitamin D  deficiency 01/15/2018   Past Surgical History:  Procedure Laterality Date   BREAST BIOPSY Right 12/15/2023   US  RT BREAST BX W LOC DEV 1ST LESION IMG BX SPEC US  GUIDE 12/15/2023 [facility]MO[facility]AP[facility]   CESAREAN SECTION     COLONOSCOPY  2019   DILATION AND CURETTAGE OF UTERUS  2016   Patient Active Problem List   Diagnosis Date Noted   Hyperlipidemia 12/21/2022   Influenza vaccination declined 12/21/2022   Chronic low back pain 12/21/2022   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   Encounter for health maintenance examination in adult 01/05/2022   Screen for STD (sexually transmitted disease) 01/05/2022   Vaccine counseling 01/05/2022   Paresthesia 02/16/2021   Screening for heart disease 02/16/2021   Type 2 diabetes mellitus with hyperglycemia, without long-term current use of insulin (HCC) 07/31/2020   Obesity (BMI 30-39.9) 07/31/2020   Leiomyoma of uterus 04/29/2019   Thickened  endometrium    Family history of heart disease 01/11/2019   Vitamin D  deficiency 01/15/2018   Gastroesophageal reflux disease 01/03/2018   Essential hypertension 01/03/2018   Chronic bilateral low back pain without sciatica 09/20/2017   Endometrial polyp 2016    PCP: Alm Gent PA-C  REFERRING PROVIDER: Dorn Ned, MD  REFERRING DIAG: M54.16 (ICD-10-CM) - Radiculopathy, lumbar region  Rationale for Evaluation and Treatment: Rehabilitation  THERAPY DIAG:  Radiculopathy, lumbar region  Other low back pain  ONSET DATE: 2017  SUBJECTIVE:  SUBJECTIVE STATEMENT:  Reports back pain all the time.  Unable to recall a time when her back did not hurt.  Continues with 6/10 pain.  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 She attempted chiropractic tx. Has not tried PT for this in the past. Pt reports that symptoms are present across the low back and radiates into Bil LE to the feet. Pt states that she has been waking up 2 times per night due to pain and is able to fall back asleep. Symptoms range in intensity from 7/10-10/10 in the low back.   PERTINENT HISTORY:  Pt has had chronic low back pain x8 year after a fall. Last imaging recorded 3 years ago and will be having further imaging completed tomorrow.  PAIN:  Are you having pain? Yes: NPRS scale: 6/10 Pain location: low back and BLE  Pain description: radiating, sharp, sore Aggravating factors: standing for 10 mins  Relieving factors: rest  PRECAUTIONS: None  RED FLAGS: None   WEIGHT BEARING RESTRICTIONS: No  FALLS:  Has patient fallen in last 6  months? 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  DIAGNOSTIC FINDINGS:  Scheduled MRI of lumbar spine tomorrow   PATIENT SURVEYS:  Modified Oswestry:  MODIFIED OSWESTRY DISABILITY SCALE  Date: 01/29/24 Score  Pain intensity 4 =  Pain medication provides me with little relief from pain.  2. Personal care (washing, dressing, etc.) 3 =  I need help, but I am able to manage most of my personal care.  3. Lifting 4 = I can lift only very light weights  4. Walking 3 =  Pain prevents me from walking more than  mile.  5. Sitting 3 =  Pain prevents me from sitting more than  hour.  6. Standing 3 =  Pain prevents me from standing more than 1/2 hour.  7. Sleeping 2 =  Even when I take pain medication, I sleep less than 6 hours  8. Social Life 4 =  Pain has restricted my social life to my home  9. Traveling 1 =  I can travel anywhere, but it increases my pain.  10. Employment/ Homemaking 3 = Pain prevents me from doing anything but light duties.  Total 30/50 (60% disability, 40% ability)   Interpretation of scores: Score Category Description  0-20% Minimal Disability The patient can cope with most living activities. Usually no treatment is indicated apart from advice on lifting, sitting and exercise  21-40% Moderate Disability The patient experiences more pain and difficulty with sitting, lifting and standing. Travel and social life are more difficult and they may be disabled from work. Personal care, sexual activity and sleeping are not grossly affected, and the patient can usually be managed by conservative  means  41-60% Severe Disability Pain remains the main problem in this group, but activities of daily living are affected. These patients require a detailed investigation  61-80% Crippled Back pain impinges on  all aspects of the patients life. Positive intervention is required  81-100% Bed-bound These patients are either bed-bound or exaggerating their symptoms  Bluford FORBES Zoe DELENA Karon DELENA, et al. Surgery versus conservative management of stable thoracolumbar fracture: the PRESTO feasibility RCT. [place] ([place]): [employer]; 2021 Nov. [facility] Technology Assessment, No. 25.62.) Appendix 3, Oswestry Disability Index category descriptors. Available from: Findjewelers.cz  Minimally Clinically Important Difference (MCID) = 12.8%  COGNITION: Overall cognitive status: Within functional limits for tasks assessed     SENSATION: L2 dermatome on RLE has increased sensitivity with pain noted during testing   POSTURE: rounded shoulders, forward head, and increased thoracic kyphosis  PALPATION: Pt has inc TTP and resting muscle tightness in paraspinals bilaterally  LUMBAR ROM:   AROM eval 02/13/24  Flexion 75% loss 75% loss, pain in low back  Extension 75% loss 50% loss  Right lateral flexion Nil loss, inc pain in LLE and L lumbar spine Nil loss  Left lateral flexion Nil loss Nil loss  Right rotation    Left rotation     (Blank rows = not tested)   LOWER EXTREMITY MMT:    MMT Right eval Left eval  Hip flexion 4/5 4-/5  Hip extension 4/5 4/5  Hip abduction 4-/5 4-/5  Hip adduction 4/5 4/5  Hip internal rotation    Hip external rotation    Knee flexion 4+/5 4/5  Knee extension 4+/5 4+/5  Ankle dorsiflexion 5/5 5/5  Ankle plantarflexion 5/5 5/5  Ankle inversion    Ankle eversion     (Blank rows = not tested)   GAIT: Distance walked: lobby to treatment area Assistive device utilized: None Level of assistance: Complete  Independence Comments: Pt has wide BOS but is able to demonstrate reciprocal pattern with dec velocity, but no noted LOB   TREATMENT DATE:  [facility] Adult PT Treatment:                                                DATE: 03/26/24 Therapeutic Exercise: Nustep L4 8 min Neuromuscular re-ed: Supine hip fallouts BluTB 15x B, 15/15 Bridge against BluTB 15x S/L clams BluTB (unable to comprehend S/L position) P-ball curl ups 15x B, 15/15 Therapeutic Activity: Supine QL stretch 30s x2 Seated hamstring stretch 30s x2  [facility] Adult PT Treatment:                                                DATE: 02/22/24 Therapeutic Exercise: Nustep L4 8 min Neuromuscular re-ed: Supine hip fallouts BluTB 15x B, 15/15 Seated hip toss 15/15 Seated chops 15/15 Therapeutic Activity: Seated hamstring stertch 30s x2 B Supine QL stretch 30s x2 B Open book 10/10  [facility] Adult PT Treatment:                                                DATE: 02/20/24 Therapeutic Exercise: Nustep L2 6 min Neuromuscular re-ed: Supine hip fallouts GTB 15x B, 15/15 Seated hip toss 10/10 Seated chops 10/10 Therapeutic Activity: Seated hamstring stretch 30s x2 B Supine QL stretch 30s x2 B   [facility] Adult  PT Treatment:                                                DATE: 02/13/24 Therapeutic Exercise: Supine hook lying knee sway bilaterally x30 htrough pain free ROM. Pt has improved tolerance to the L vs R Pt completes seated W stretch with physioball x12 cycles for elongation of posterior trunk musculature Repeated lumbar extension in standing x5 reps, dec better baseline pain at rest, noted improved ROM as she progresses, limited tolerance to 5 reps, will begin with this for HEP and progress as tolerated.  Supine bridging 3x8 reps, 2 sec hold in hook lying with improved tolerance and ROM as sets and reps progress Overhead lat stretch 2x15 in supine with dowel through pain free ROM Seated hamstring isometric with physioball 10x10s BLE  in short sit    [facility] Adult PT Treatment:                                                DATE: 01/29/24 Therapeutic Exercise: Slouch overcorrect x30 reps in sitting, well tolerated through mid range  Seated sciatic nerve glide x15 RLE, follows produced, not worse response Seated piriformis stretch x30 in sitting to RLE in fig 4 position, symptoms prod NW in lumbar spine and stretch felt in appropriate location HEP developed and provided, reviewed with teach back. Pt educated on relevant anatomy, physiology, mechanics, and pathology related to symptoms.                                                                                                                                  PATIENT EDUCATION:  Education details: Pt educated on relevant anatomy, physiology, pathology, diagnosis, prognosis, progression of care, pain and activity modification related to chronic low back pain  Person educated: Patient Education method: Explanation, Demonstration, Handouts, and teach back Education comprehension: verbalized understanding and returned demonstration  HOME EXERCISE PROGRAM: Access Code: [email or web address] URL: [email or web address] Date: 02/13/2024 Prepared by: Stann Ohara  Exercises - Slouch Overcorrect on Swiss Ball  - 2 x daily - 7 x weekly - 1 sets - 30 reps - 2 hold - Seated Sciatic Nerve Glide With Cervical Motion  - 2 x daily - 7 x weekly - 3 sets - 15 reps - 2 hold - Seated Figure 4 Piriformis Stretch  - 1 x daily - 7 x weekly - 2 sets - 15 reps - 2 hold - Supine Bridge  - 1 x daily - 7 x weekly - 3 sets - 8 reps - 2 hold - Standing Lumbar Extension  - 4 x daily - 7 x weekly - 1 sets - 5 reps - 2 hold  ASSESSMENT:  CLINICAL IMPRESSION:  Little gains reported with PT.  Patient reports constant low back pain and unable to relate relieving factors.  Continued to focus on stretching and self management strategies/exercises.  Patient unable to comprehend S/L position for clams.  Recommend patient to f/u with primary PT for next session to determine DC or re-cert.  Eval:  Patient is a 63 y.o. F who was seen today for physical therapy evaluation and treatment for chronic low back pain. Pt is limited in her function due to the presence of pain and associated ROM loss in the lumbar spine. She has intermittent pain in bil LE and constant pain in the lumbar spine. She has not been working the last 3 years due to pain. Based on subjective report and objective findings, as well as the consideration of further imaging upcoming, HEP provided with good evidence of tolerance in clinic to address mid range movements of the lumbar spine, elongation to the hip complex, and sciatic nerve glide. Pt stands to benefit from continued skilled physical therapy to address deficit areas and restore safety with activities and participations at home and in the community.    OBJECTIVE IMPAIRMENTS: decreased activity tolerance, decreased endurance, decreased ROM, decreased strength, impaired flexibility, impaired sensation, postural dysfunction, and pain.   ACTIVITY LIMITATIONS: carrying, lifting, bending, sitting, standing, squatting, and sleeping  PARTICIPATION LIMITATIONS: meal prep, cleaning, laundry, shopping, community activity, and occupation  PERSONAL FACTORS: Time since onset of injury/illness/exacerbation and 1 comorbidity: Diabetes are also affecting patient's functional outcome.   REHAB POTENTIAL: Good  CLINICAL DECISION MAKING: Stable/uncomplicated  EVALUATION COMPLEXITY: Low   GOALS: Goals reviewed with patient? Yes  SHORT TERM GOALS: Target date: 03/04/24   Pt will report compliance with HEP to work towards ind and home management strategies Baseline: [email or web address] Goal status: Ongoing   2.  Pt will score no greater than 15/50 on ODI to demonstrate improved activity tolerance Baseline: 30/50 Goal status: INITIAL   3.  Pt will improve lumbar spine ROM to full and painless in  order to demonstrate progress towards activity tolerance and improved function Baseline: limited in flexion and extension ROM, symptoms also reproduced with right side glide in standing  Goal status: INITIAL   4.  Pt will report an average level of pain at rest as 4/10 or less Baseline: 7/10; 03/26/24 6/10 constant pain Goal status: Ongoing      LONG TERM GOALS: Target date: 04/08/24   Pt will score no greater than 10/50 on ODI to demonstrate improved activity tolerance Baseline: 30/50 Goal status: INITIAL   2.  Pt will report no greater than 3/10 pain over 7 consecutive days to demonstrate maintained reduction in symptoms and improved tolerance to activity Baseline: 7/10 baseline at rest Goal status: INITIAL   3.  Pt will be ind in the management of their symptoms at home and in the community Baseline: uncontrolled symptoms of low back pain and bil LE pain Goal status: INITIAL    PLAN:  PT FREQUENCY: 1-2x/week  PT DURATION: 10 weeks  PLANNED INTERVENTIONS: 97110-Therapeutic exercises, 97530- Therapeutic activity, [id number]- Neuromuscular re-education, 97535- Self Care, [id number]- Manual therapy, [id number]- Electrical stimulation (unattended), 20560 (1-2 muscles), 20561 (3+ muscles)- Dry Needling, Patient/Family education, Cryotherapy, and Moist heat.  PLAN FOR NEXT SESSION: lower quarter strength and stability through functional movement patterns as tolerated, soft tissue mobilization   Reyes CHRISTELLA Kohut, PT 03/26/2024, 4:58 PM  "

## 2024-03-26 ENCOUNTER — Ambulatory Visit: Attending: Neurosurgery

## 2024-03-26 DIAGNOSIS — M5416 Radiculopathy, lumbar region: Secondary | ICD-10-CM | POA: Diagnosis present

## 2024-03-26 DIAGNOSIS — M5459 Other low back pain: Secondary | ICD-10-CM | POA: Insufficient documentation

## 2024-03-26 NOTE — Telephone Encounter (Signed)
 Pt is going out of state for the holidays and will call back and schedule a visit when she gets back in town. She has not scheduled the surgery yet

## 2024-04-09 ENCOUNTER — Ambulatory Visit: Attending: Neurosurgery | Admitting: Physical Therapy

## 2024-04-09 ENCOUNTER — Encounter: Payer: Self-pay | Admitting: Physical Therapy

## 2024-04-09 DIAGNOSIS — M5416 Radiculopathy, lumbar region: Secondary | ICD-10-CM | POA: Insufficient documentation

## 2024-04-09 DIAGNOSIS — M5459 Other low back pain: Secondary | ICD-10-CM | POA: Insufficient documentation

## 2024-04-09 NOTE — Therapy (Signed)
 " OUTPATIENT PHYSICAL THERAPY THORACOLUMBAR TREATMENT   Patient Name: Amy [name] MRN: [id number] DOB:02/22/1961, 64 y.o., female Today's Date: 04/09/2024  END OF SESSION:  PT End of Session - 04/09/24 1619     Visit Number 6    Number of Visits 17    Date for Recertification  04/08/24    Authorization Type [place] [employer]    Authorization Time Period approved 6 PT visits from 02/20/24-04/19/24    Authorization - Visit Number 6    Authorization - Number of Visits 6    Progress Note Due on Visit 6    PT Start Time 1615    PT Stop Time 1700    PT Time Calculation (min) 45 min    Activity Tolerance Patient tolerated treatment well    Behavior During Therapy [facility] for tasks assessed/performed               Past Medical History:  Diagnosis Date   Abnormal Pap smear of cervix    Endometrial polyp 2016   [facility]   GERD (gastroesophageal reflux disease)    Glaucoma    Hyperlipidemia    Hypertension    Leiomyoma of uterus 04/29/2019   Prediabetes    Thickened endometrium 2016   per [facility] records   Vitamin D  deficiency 01/15/2018   Past Surgical History:  Procedure Laterality Date   BREAST BIOPSY Right 12/15/2023   US  RT BREAST BX W LOC DEV 1ST LESION IMG BX SPEC US  GUIDE 12/15/2023 [facility]MO[facility]   CESAREAN SECTION     COLONOSCOPY  2019   DILATION AND CURETTAGE OF UTERUS  2016   Patient Active Problem List   Diagnosis Date Noted   Hyperlipidemia 12/21/2022   Influenza vaccination declined 12/21/2022   Chronic low back pain 12/21/2022   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   Encounter for health maintenance examination in adult 01/05/2022   Screen for STD (sexually transmitted disease) 01/05/2022   Vaccine counseling 01/05/2022   Paresthesia 02/16/2021   Screening for heart disease 02/16/2021   Type 2 diabetes mellitus with hyperglycemia, without long-term current use of insulin (HCC) 07/31/2020   Obesity (BMI 30-39.9) 07/31/2020   Leiomyoma of uterus  04/29/2019   Thickened endometrium    Family history of heart disease 01/11/2019   Vitamin D  deficiency 01/15/2018   Gastroesophageal reflux disease 01/03/2018   Essential hypertension 01/03/2018   Chronic bilateral low back pain without sciatica 09/20/2017   Endometrial polyp 2016    PCP: Alm Gent PA-C  REFERRING PROVIDER: Dorn Ned, MD  REFERRING DIAG: M54.16 (ICD-10-CM) - Radiculopathy, lumbar region  Rationale for Evaluation and Treatment: Rehabilitation  THERAPY DIAG:  Radiculopathy, lumbar region  Other low back pain  ONSET DATE: 2017  SUBJECTIVE:  SUBJECTIVE STATEMENT:  Pt states that she has been feeling better since beginning PT. She states that she had inc pain 3 days ago after cooking a meal, was able to improve symptoms with ice and laying on the ground. Pt reports compliance with HEP. Pt states that her symptoms are intermittent in nature, but are severe when present. Pt states that she would like to avoid surgery if possible. Pt reports 25% improvement since initial eval.   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 She attempted chiropractic tx. Has not tried PT for this in the past. Pt reports that symptoms are present across the low back and radiates into Bil LE to the feet. Pt states that she has been waking up 2 times per night due to pain and is able to fall back asleep. Symptoms range in intensity from 7/10-10/10 in the low back.   PERTINENT HISTORY:  Pt has had chronic low back pain x8 year after a fall. Last imaging recorded 3 years ago and will be having further imaging completed  tomorrow.  PAIN:  Are you having pain? Yes: NPRS scale: 6/10 Pain location: low back and BLE  Pain description: radiating, sharp, sore Aggravating factors: standing for 10 mins  Relieving factors: rest  PRECAUTIONS: None  RED FLAGS: None   WEIGHT BEARING RESTRICTIONS: No  FALLS:  Has patient fallen in last 6 months? 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  DIAGNOSTIC FINDINGS:  Scheduled MRI of lumbar spine tomorrow   PATIENT SURVEYS:  Modified Oswestry:  MODIFIED OSWESTRY DISABILITY SCALE   Date: 01/29/24 Score 04/09/24  Pain intensity 4 =  Pain medication provides me with little relief from pain. 4  2. Personal care (washing, dressing, etc.) 3 =  I need help, but I am able to manage most of my personal care. 1  3. Lifting 4 = I can lift only very light weights 4  4. Walking 3 =  Pain prevents me from walking more than  mile. 3  5. Sitting 3 =  Pain prevents me from sitting more than  hour. 0  6. Standing 3 =  Pain prevents me from standing more than 1/2 hour. 4  7. Sleeping 2 =  Even when I take pain medication, I sleep less than 6 hours 4  8. Social Life 4 =  Pain has restricted my social life to my home 5  9. Traveling 1 =  I can travel anywhere, but it increases my pain. 4  10. Employment/ Homemaking 3 = Pain prevents me from doing anything but light duties. 2  Total 30/50 (60% disability, 40% ability) 31/50   Interpretation of scores: Score Category Description  0-20% Minimal Disability The patient can cope with most living activities. Usually no treatment is indicated apart  from advice on lifting, sitting and exercise  21-40% Moderate Disability The patient experiences more pain and difficulty with sitting, lifting and standing. Travel  and social life are more difficult and they may be disabled from work. Personal care, sexual activity and sleeping are not grossly affected, and the patient can usually be managed by conservative means  41-60% Severe Disability Pain remains the main problem in this group, but activities of daily living are affected. These patients require a detailed investigation  61-80% Crippled Back pain impinges on all aspects of the patients life. Positive intervention is required  81-100% Bed-bound These patients are either bed-bound or exaggerating their symptoms  Bluford FORBES Zoe DELENA Karon DELENA, et al. Surgery versus conservative management of stable thoracolumbar fracture: the PRESTO feasibility RCT. [place] ([place]): [employer]; 2021 Nov. [facility] [employer] Assessment, No. 25.62.) Appendix 3, Oswestry Disability Index category descriptors. Available from: Findjewelers.cz  Minimally Clinically Important Difference (MCID) = 12.8%  COGNITION: Overall cognitive status: Within functional limits for tasks assessed     SENSATION: L2 dermatome on RLE has increased sensitivity with pain noted during testing   POSTURE: rounded shoulders, forward head, and increased thoracic kyphosis  PALPATION: Pt has inc TTP and resting muscle tightness in paraspinals bilaterally  LUMBAR ROM:   AROM eval 02/13/24 04/09/24  Flexion 75% loss 75% loss, pain in low back 25% loss, pain in left side of low back and left post hip  Extension 75% loss 50% loss 25% loss  Right lateral flexion Nil loss, inc pain in LLE and L lumbar spine Nil loss Nil loss  Left lateral flexion Nil loss Nil loss Nil loss  Right rotation     Left rotation      (Blank rows = not tested)   LOWER EXTREMITY MMT:    MMT Right eval Left eval Right   04/09/24 Left 04/09/24  Hip flexion 4/5 4-/5 4/5 4/5  Hip extension 4/5 4/5    Hip abduction 4-/5 4-/5 4/5 4/5  Hip adduction 4/5 4/5 4+/5 4+/5  Hip internal rotation      Hip external rotation      Knee flexion 4+/5 4/5 4+/5 4+/5  Knee extension 4+/5 4+/5 4+/5 4+/5  Ankle dorsiflexion 5/5 5/5 5/5 5/5  Ankle plantarflexion 5/5 5/5 5/5 5/5  Ankle inversion      Ankle eversion       (Blank rows = not tested)   GAIT: Distance walked: lobby to treatment area Assistive device utilized: None Level of assistance: Complete Independence Comments: Pt has wide BOS but is able to demonstrate reciprocal pattern with dec velocity, but no noted LOB   TREATMENT DATE:   [facility] Adult PT Treatment:                                                DATE: 04/09/24 Therapeutic Activity: PT POC reviewed and discussed, goals assessed and updated to reflect current status this reporting period.  Pt reviews HEP and able to demonstrate tolerance to 10 reps of repeated end range extension which improves and abolishes symptoms in her low back. Discussed importance of dosage of this intervention and pt in agreement.    [facility] Adult PT Treatment:                                                DATE: 03/26/24 Therapeutic Exercise: Nustep L4  8 min Neuromuscular re-ed: Supine hip fallouts BluTB 15x B, 15/15 Bridge against BluTB 15x S/L clams BluTB (unable to comprehend S/L position) P-ball curl ups 15x B, 15/15 Therapeutic Activity: Supine QL stretch 30s x2 Seated hamstring stretch 30s x2  [facility] Adult PT Treatment:                                                DATE: 02/22/24 Therapeutic Exercise: Nustep L4 8 min Neuromuscular re-ed: Supine hip fallouts BluTB 15x B, 15/15 Seated hip toss 15/15 Seated chops 15/15 Therapeutic Activity: Seated hamstring stertch 30s x2 B Supine QL stretch 30s x2 B Open book 10/10  [facility] Adult PT Treatment:                                                DATE:  02/20/24 Therapeutic Exercise: Nustep L2 6 min Neuromuscular re-ed: Supine hip fallouts GTB 15x B, 15/15 Seated hip toss 10/10 Seated chops 10/10 Therapeutic Activity: Seated hamstring stretch 30s x2 B Supine QL stretch 30s x2 B   [facility] Adult PT Treatment:                                                DATE: 02/13/24 Therapeutic Exercise: Supine hook lying knee sway bilaterally x30 htrough pain free ROM. Pt has improved tolerance to the L vs R Pt completes seated W stretch with physioball x12 cycles for elongation of posterior trunk musculature Repeated lumbar extension in standing x5 reps, dec better baseline pain at rest, noted improved ROM as she progresses, limited tolerance to 5 reps, will begin with this for HEP and progress as tolerated.  Supine bridging 3x8 reps, 2 sec hold in hook lying with improved tolerance and ROM as sets and reps progress Overhead lat stretch 2x15 in supine with dowel through pain free ROM Seated hamstring isometric with physioball 10x10s BLE in short sit    [facility] Adult PT Treatment:                                                DATE: 01/29/24 Therapeutic Exercise: Slouch overcorrect x30 reps in sitting, well tolerated through mid range  Seated sciatic nerve glide x15 RLE, follows produced, not worse response Seated piriformis stretch x30 in sitting to RLE in fig 4 position, symptoms prod NW in lumbar spine and stretch felt in appropriate location HEP developed and provided, reviewed with teach back. Pt educated on relevant anatomy, physiology, mechanics, and pathology related to symptoms.  PATIENT EDUCATION:  Education details: Pt educated on relevant anatomy, physiology, pathology, diagnosis, prognosis, progression of care, pain and activity modification related to chronic low back pain  Person educated: Patient Education  method: Explanation, Demonstration, Handouts, and teach back Education comprehension: verbalized understanding and returned demonstration  HOME EXERCISE PROGRAM: Access Code: [email or web address] URL: [email or web address] Date: 04/09/2024 Prepared by: Stann Ohara  Exercises - Standing Lumbar Extension  - 3-5 x daily - 7 x weekly - 1 sets - 10 reps - 2 hold ASSESSMENT:  CLINICAL IMPRESSION:   Pt has been completing lumbar extension intermittently at home, every other day, sometimes once per day, has some benefit with completion but not lasting. Progressed reps today to complete 10 reps of repeated lumbar extension in standing with good effect, improves symptoms noted prior with flexion of the lumbar spine. Pt HEP modified to emphasize this as symptom reducer with 3-5 times per day, x10 reps per. Pt symptoms abolished in sitting following completion. Educated on rationale for this intervention and importance of dosage with the improved tolerance to complete 10 consecutive reps. LE strength maintained or progressed since initial eval. Pt stands to benefit from continued skilled physical therapy once per week x10 weeks to address deficit areas and restore safety and improved quality of life with activities and participations at home and in the community without being limited by her symptoms.    Eval:  Patient is a 64 y.o. F who was seen today for physical therapy evaluation and treatment for chronic low back pain. Pt is limited in her function due to the presence of pain and associated ROM loss in the lumbar spine. She has intermittent pain in bil LE and constant pain in the lumbar spine. She has not been working the last 3 years due to pain. Based on subjective report and objective findings, as well as the consideration of further imaging upcoming, HEP provided with good evidence of tolerance in clinic to address mid range movements of the lumbar spine, elongation to the hip complex, and sciatic  nerve glide. Pt stands to benefit from continued skilled physical therapy to address deficit areas and restore safety with activities and participations at home and in the community.    OBJECTIVE IMPAIRMENTS: decreased activity tolerance, decreased endurance, decreased ROM, decreased strength, impaired flexibility, impaired sensation, postural dysfunction, and pain.   ACTIVITY LIMITATIONS: carrying, lifting, bending, sitting, standing, squatting, and sleeping  PARTICIPATION LIMITATIONS: meal prep, cleaning, laundry, shopping, community activity, and occupation  PERSONAL FACTORS: Time since onset of injury/illness/exacerbation and 1 comorbidity: Diabetes are also affecting patient's functional outcome.   REHAB POTENTIAL: Good  CLINICAL DECISION MAKING: Stable/uncomplicated  EVALUATION COMPLEXITY: Low   GOALS: Goals reviewed with patient? Yes  SHORT TERM GOALS: Target date: 03/04/24   Pt will report compliance with HEP to work towards ind and home management strategies Baseline: [id number] Goal status: Ongoing   2.  Pt will score no greater than 15/50 on ODI to demonstrate improved activity tolerance Baseline: 30/50 Goal status: IN PROGRESS   3.  Pt will improve lumbar spine ROM to full and painless in order to demonstrate progress towards activity tolerance and improved function Baseline: limited in flexion and extension ROM, symptoms also reproduced with right side glide in standing  Goal status: IN PROGRESS   4.  Pt will report an average level of pain at rest as 4/10 or less Baseline: 7/10; 03/26/24 6/10 constant pain 04/09/24: episodes of 8/10 pain Goal status: Ongoing      LONG TERM  GOALS: Target date: 06/21/24   Pt will score no greater than 10/50 on ODI to demonstrate improved activity tolerance Baseline: 30/50 Goal status: IN PROGRESS   2.  Pt will report no greater than 3/10 pain over 7 consecutive days to demonstrate maintained reduction in symptoms and improved  tolerance to activity Baseline: 7/10 baseline at rest Goal status: IN PROGRESS   3.  Pt will be ind in the management of their symptoms at home and in the community Baseline: uncontrolled symptoms of low back pain and bil LE pain Goal status: IN PROGRESS    PLAN:  PT FREQUENCY: 1-2x/week  PT DURATION: 10 weeks  PLANNED INTERVENTIONS: 97110-Therapeutic exercises, 97530- Therapeutic activity, [id number]- Neuromuscular re-education, 97535- Self Care, [id number]- Manual therapy, [id number]- Electrical stimulation (unattended), 20560 (1-2 muscles), 20561 (3+ muscles)- Dry Needling, Patient/Family education, Cryotherapy, and Moist heat.  PLAN FOR NEXT SESSION: lower quarter strength and stability through functional movement patterns as tolerated, soft tissue mobilization, reassess response to repeated end range movement testing.   Stann DELENA Ohara, PT 04/09/2024, 9:27 PM  "

## 2024-04-16 ENCOUNTER — Other Ambulatory Visit: Payer: Self-pay | Admitting: Neurosurgery

## 2024-04-17 ENCOUNTER — Encounter: Payer: Self-pay | Admitting: Physical Therapy

## 2024-04-17 ENCOUNTER — Ambulatory Visit: Admitting: Medical

## 2024-04-17 ENCOUNTER — Ambulatory Visit: Admitting: Physical Therapy

## 2024-04-17 ENCOUNTER — Telehealth: Payer: Self-pay | Admitting: Family Medicine

## 2024-04-17 DIAGNOSIS — M5459 Other low back pain: Secondary | ICD-10-CM

## 2024-04-17 DIAGNOSIS — M5416 Radiculopathy, lumbar region: Secondary | ICD-10-CM

## 2024-04-17 DIAGNOSIS — R0989 Other specified symptoms and signs involving the circulatory and respiratory systems: Secondary | ICD-10-CM

## 2024-04-17 NOTE — Telephone Encounter (Signed)
 Pt wants to know when she needs to have abi on her legs again?

## 2024-04-17 NOTE — Therapy (Signed)
 " OUTPATIENT PHYSICAL THERAPY THORACOLUMBAR TREATMENT   Patient Name: Amy [name] MRN: [id number] DOB:1960/05/18, 64 y.o., female Today's Date: 04/17/2024  END OF SESSION:  Amy [name] End of Session - 04/17/24 1454     Visit Number 7    Number of Visits 17    Date for Recertification  06/21/24    Authorization Type [place] [employer]    Authorization Time Period approved 6 Amy [name] visits from 02/20/24-04/19/24    Authorization - Visit Number 6    Amy [name] Start Time 1449    Amy [name] Stop Time 1530    Amy [name] Time Calculation (min) 41 min    Activity Tolerance Patient tolerated treatment well    Behavior During Therapy [facility] for tasks assessed/performed                Past Medical History:  Diagnosis Date   Abnormal Pap smear of cervix    Endometrial polyp 2016   [facility]   GERD (gastroesophageal reflux disease)    Glaucoma    Hyperlipidemia    Hypertension    Leiomyoma of uterus 04/29/2019   Prediabetes    Thickened endometrium 2016   per [facility] records   Vitamin D  deficiency 01/15/2018   Past Surgical History:  Procedure Laterality Date   BREAST BIOPSY Right 12/15/2023   US  RT BREAST BX W LOC DEV 1ST LESION IMG BX SPEC US  GUIDE 12/15/2023 [facility]MO[facility]   CESAREAN SECTION     COLONOSCOPY  2019   DILATION AND CURETTAGE OF UTERUS  2016   Patient Active Problem List   Diagnosis Date Noted   Hyperlipidemia 12/21/2022   Influenza vaccination declined 12/21/2022   Chronic low back pain 12/21/2022   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   Encounter for health maintenance examination in adult 01/05/2022   Screen for STD (sexually transmitted disease) 01/05/2022   Vaccine counseling 01/05/2022   Paresthesia 02/16/2021   Screening for heart disease 02/16/2021   Type 2 diabetes mellitus with hyperglycemia, without long-term current use of insulin (HCC) 07/31/2020   Obesity (BMI 30-39.9) 07/31/2020   Leiomyoma of uterus 04/29/2019   Thickened endometrium    Family history of heart  disease 01/11/2019   Vitamin D  deficiency 01/15/2018   Gastroesophageal reflux disease 01/03/2018   Essential hypertension 01/03/2018   Chronic bilateral low back pain without sciatica 09/20/2017   Endometrial polyp 2016    PCP: Alm Gent PA-C  REFERRING PROVIDER: Dorn Ned, MD  REFERRING DIAG: M54.16 (ICD-10-CM) - Radiculopathy, lumbar region  Rationale for Evaluation and Treatment: Rehabilitation  THERAPY DIAG:  Radiculopathy, lumbar region  Other low back pain  ONSET DATE: 2017  SUBJECTIVE:  SUBJECTIVE STATEMENT:  Amy [name] states that she has 6/10 pain in her low back today. Last completion of HEP noted was yesterday. Amy [name] states that she has deferred scheduled lumbar surgery to later date.   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 She attempted chiropractic tx. Has not tried Amy [name] for this in the past. Amy [name] reports that symptoms are present across the low back and radiates into Bil LE to the feet. Amy [name] states that she has been waking up 2 times per night due to pain and is able to fall back asleep. Symptoms range in intensity from 7/10-10/10 in the low back.   PERTINENT HISTORY:  Amy [name] has had chronic low back pain x8 year after a fall. Last imaging recorded 3 years ago and will be having further imaging completed tomorrow.  PAIN:  Are you having pain? Yes: NPRS scale: 6/10 Pain location: low back and BLE  Pain description: radiating, sharp, sore Aggravating factors: standing for 10 mins  Relieving factors: rest  PRECAUTIONS: None  RED FLAGS: None   WEIGHT BEARING RESTRICTIONS: No  FALLS:  Has patient  fallen in last 6 months? 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  DIAGNOSTIC FINDINGS:  Scheduled MRI of lumbar spine tomorrow   PATIENT SURVEYS:  Modified Oswestry:  MODIFIED OSWESTRY DISABILITY SCALE   Date: 01/29/24 Score 04/09/24  Pain intensity 4 =  Pain medication provides me with little relief from pain. 4  2. Personal care (washing, dressing, etc.) 3 =  I need help, but I am able to manage most of my personal care. 1  3. Lifting 4 = I can lift only very light weights 4  4. Walking 3 =  Pain prevents me from walking more than  mile. 3  5. Sitting 3 =  Pain prevents me from sitting more than  hour. 0  6. Standing 3 =  Pain prevents me from standing more than 1/2 hour. 4  7. Sleeping 2 =  Even when I take pain medication, I sleep less than 6 hours 4  8. Social Life 4 =  Pain has restricted my social life to my home 5  9. Traveling 1 =  I can travel anywhere, but it increases my pain. 4  10. Employment/ Homemaking 3 = Pain prevents me from doing anything but light duties. 2  Total 30/50 (60% disability, 40% ability) 31/50   Interpretation of scores: Score Category Description  0-20% Minimal Disability The patient can cope with most living activities. Usually no treatment is indicated apart from advice on lifting, sitting and exercise  21-40% Moderate Disability The patient experiences more pain and difficulty with sitting, lifting and standing. Travel and social life are more difficult and they may be disabled from work. Personal care, sexual activity and sleeping are not grossly affected, and  the patient can usually be managed by conservative means  41-60% Severe Disability Pain remains the main problem in  this group, but activities of daily living are affected. These patients require a detailed investigation  61-80% Crippled Back pain impinges on all aspects of the patients life. Positive intervention is required  81-100% Bed-bound These patients are either bed-bound or exaggerating their symptoms  Bluford FORBES Zoe DELENA Karon DELENA, et al. Surgery versus conservative management of stable thoracolumbar fracture: the PRESTO feasibility RCT. [place] ([place]): [employer]; 2021 Nov. [facility] Technology Assessment, No. 25.62.) Appendix 3, Oswestry Disability Index category descriptors. Available from: Findjewelers.cz  Minimally Clinically Important Difference (MCID) = 12.8%  COGNITION: Overall cognitive status: Within functional limits for tasks assessed     SENSATION: L2 dermatome on RLE has increased sensitivity with pain noted during testing   POSTURE: rounded shoulders, forward head, and increased thoracic kyphosis  PALPATION: Amy [name] has inc TTP and resting muscle tightness in paraspinals bilaterally  LUMBAR ROM:   AROM eval 02/13/24 04/09/24  Flexion 75% loss 75% loss, pain in low back 25% loss, pain in left side of low back and left post hip  Extension 75% loss 50% loss 25% loss  Right lateral flexion Nil loss, inc pain in LLE and L lumbar spine Nil loss Nil loss  Left lateral flexion Nil loss Nil loss Nil loss  Right rotation     Left rotation      (Blank rows = not tested)   LOWER EXTREMITY MMT:    MMT Right eval Left eval Right  04/09/24 Left 04/09/24  Hip flexion 4/5 4-/5 4/5 4/5  Hip extension 4/5 4/5    Hip abduction 4-/5 4-/5 4/5 4/5  Hip adduction 4/5 4/5 4+/5 4+/5  Hip internal rotation      Hip external rotation      Knee flexion 4+/5 4/5 4+/5 4+/5  Knee extension 4+/5 4+/5 4+/5 4+/5  Ankle dorsiflexion 5/5 5/5 5/5 5/5   Ankle plantarflexion 5/5 5/5 5/5 5/5  Ankle inversion      Ankle eversion       (Blank rows = not tested)   GAIT: Distance walked: lobby to treatment area Assistive device utilized: None Level of assistance: Complete Independence Comments: Amy [name] has wide BOS but is able to demonstrate reciprocal pattern with dec velocity, but no noted LOB   TREATMENT DATE:  [facility] Adult Amy [name] Treatment:                                                DATE: 04/17/24 Therapeutic Exercise: Repeated extension in standing x10: dec, better; educated on use of this specific intervention for home and symptom management NuStep x6 mins with UE use, level 5, seat 7 UE 8 Supine PNF contract relax 10x10s with LE on physioball Sit to Stand x15 consecutive reps without HHA Unilateral long sit hamstring stretch x30s BLE    [facility] Adult Amy [name] Treatment:                                                DATE: 04/09/24 Therapeutic Activity: Amy [name] POC reviewed and discussed, goals assessed and updated to reflect current status this reporting period.  Amy [name] reviews HEP and able to demonstrate tolerance to 10 reps of repeated end range extension which improves and abolishes symptoms in her low back. Discussed importance  of dosage of this intervention and Amy [name] in agreement.    [facility] Adult Amy [name] Treatment:                                                DATE: 03/26/24 Therapeutic Exercise: Nustep L4 8 min Neuromuscular re-ed: Supine hip fallouts BluTB 15x B, 15/15 Bridge against BluTB 15x S/L clams BluTB (unable to comprehend S/L position) P-ball curl ups 15x B, 15/15 Therapeutic Activity: Supine QL stretch 30s x2 Seated hamstring stretch 30s x2  [facility] Adult Amy [name] Treatment:                                                DATE: 02/22/24 Therapeutic Exercise: Nustep L4 8 min Neuromuscular re-ed: Supine hip fallouts BluTB 15x B, 15/15 Seated hip toss 15/15 Seated chops 15/15 Therapeutic Activity: Seated hamstring stertch 30s x2 B Supine QL  stretch 30s x2 B Open book 10/10  [facility] Adult Amy [name] Treatment:                                                DATE: 02/20/24 Therapeutic Exercise: Nustep L2 6 min Neuromuscular re-ed: Supine hip fallouts GTB 15x B, 15/15 Seated hip toss 10/10 Seated chops 10/10 Therapeutic Activity: Seated hamstring stretch 30s x2 B Supine QL stretch 30s x2 B                              PATIENT EDUCATION:  Education details: Amy [name] educated on relevant anatomy, physiology, pathology, diagnosis, prognosis, progression of care, pain and activity modification related to chronic low back pain  Person educated: Patient Education method: Explanation, Demonstration, Handouts, and teach back Education comprehension: verbalized understanding and returned demonstration  HOME EXERCISE PROGRAM: Access Code: [email or web address] URL: [email or web address] Date: 04/09/2024 Prepared by: Stann Ohara  Exercises - Standing Lumbar Extension  - 3-5 x daily - 7 x weekly - 1 sets - 10 reps - 2 hold ASSESSMENT:  CLINICAL IMPRESSION:   Educated on importance of frequency compliance, she has been able to get in 3 times per day, would benefit from 5 times given relief x1 hour and hopefully maintain reduction and complete as prophylactic. Tolerated session well and able to progress through soft tissue mobility, endurance, and strengthening. End of session Amy [name] states that she will be using herbs sent from family in [place] to assist in symptom management. Amy [name] defers next 2 scheduled visits and will follow up at 05/07/24 appt. Amy [name] stands to benefit from continued skilled physical therapy to address deficit areas and restore safety with activities and participations at home and in the community.      Eval:  Patient is a 64 y.o. F who was seen today for physical therapy evaluation and treatment for chronic low back pain. Amy [name] is limited in her function due to the presence of pain and associated ROM loss in the lumbar spine. She has  intermittent pain in bil LE and constant pain in the lumbar spine. She has not been working the last 3 years due to pain. Based on subjective  report and objective findings, as well as the consideration of further imaging upcoming, HEP provided with good evidence of tolerance in clinic to address mid range movements of the lumbar spine, elongation to the hip complex, and sciatic nerve glide. Amy [name] stands to benefit from continued skilled physical therapy to address deficit areas and restore safety with activities and participations at home and in the community.    OBJECTIVE IMPAIRMENTS: decreased activity tolerance, decreased endurance, decreased ROM, decreased strength, impaired flexibility, impaired sensation, postural dysfunction, and pain.   ACTIVITY LIMITATIONS: carrying, lifting, bending, sitting, standing, squatting, and sleeping  PARTICIPATION LIMITATIONS: meal prep, cleaning, laundry, shopping, community activity, and occupation  PERSONAL FACTORS: Time since onset of injury/illness/exacerbation and 1 comorbidity: Diabetes are also affecting patient's functional outcome.   REHAB POTENTIAL: Good  CLINICAL DECISION MAKING: Stable/uncomplicated  EVALUATION COMPLEXITY: Low   GOALS: Goals reviewed with patient? Yes  SHORT TERM GOALS: Target date: 03/04/24   Amy [name] will report compliance with HEP to work towards ind and home management strategies Baseline: [email or web address] Goal status: Ongoing   2.  Amy [name] will score no greater than 15/50 on ODI to demonstrate improved activity tolerance Baseline: 30/50 Goal status: IN PROGRESS   3.  Amy [name] will improve lumbar spine ROM to full and painless in order to demonstrate progress towards activity tolerance and improved function Baseline: limited in flexion and extension ROM, symptoms also reproduced with right side glide in standing  Goal status: IN PROGRESS   4.  Amy [name] will report an average level of pain at rest as 4/10 or less Baseline: 7/10; 03/26/24 6/10  constant pain 04/09/24: episodes of 8/10 pain Goal status: Ongoing      LONG TERM GOALS: Target date: 06/21/24   Amy [name] will score no greater than 10/50 on ODI to demonstrate improved activity tolerance Baseline: 30/50 Goal status: IN PROGRESS   2.  Amy [name] will report no greater than 3/10 pain over 7 consecutive days to demonstrate maintained reduction in symptoms and improved tolerance to activity Baseline: 7/10 baseline at rest Goal status: IN PROGRESS   3.  Amy [name] will be ind in the management of their symptoms at home and in the community Baseline: uncontrolled symptoms of low back pain and bil LE pain Goal status: IN PROGRESS    PLAN:  Amy [name] FREQUENCY: 1-2x/week  Amy [name] DURATION: 10 weeks  PLANNED INTERVENTIONS: 97110-Therapeutic exercises, 97530- Therapeutic activity, [id number]- Neuromuscular re-education, 97535- Self Care, [id number]- Manual therapy, [id number]- Electrical stimulation (unattended), 20560 (1-2 muscles), 20561 (3+ muscles)- Dry Needling, Patient/Family education, Cryotherapy, and Moist heat.  PLAN FOR NEXT SESSION: lower quarter strength and stability through functional movement patterns as tolerated, soft tissue mobilization, reassess response to repeated end range movement testing.   Stann DELENA Ohara, Amy [name] 04/17/2024, 3:20 PM  "

## 2024-04-18 ENCOUNTER — Ambulatory Visit: Admitting: Medical

## 2024-04-18 NOTE — Telephone Encounter (Signed)
 Is the ABIs bilateral?

## 2024-04-18 NOTE — Telephone Encounter (Signed)
 What's the reason for ABIs, last one  done in 2021

## 2024-04-18 NOTE — Telephone Encounter (Signed)
 Pt was notified that orders have been placed.   Pt has canceled her surgery. As she is afraid of going through with it. She will call back to schedule pre-op when she decides to go through with it

## 2024-04-23 ENCOUNTER — Ambulatory Visit: Admitting: Physical Therapy

## 2024-04-24 ENCOUNTER — Ambulatory Visit: Admitting: Medical

## 2024-04-25 ENCOUNTER — Ambulatory Visit (HOSPITAL_COMMUNITY)
Admission: RE | Admit: 2024-04-25 | Discharge: 2024-04-25 | Disposition: A | Discharge status: Home / Self Care | Source: Ambulatory Visit | Attending: Medical | Admitting: Medical

## 2024-04-25 DIAGNOSIS — R0989 Other specified symptoms and signs involving the circulatory and respiratory systems: Secondary | ICD-10-CM | POA: Diagnosis not present

## 2024-04-26 ENCOUNTER — Ambulatory Visit: Payer: Self-pay | Admitting: Medical

## 2024-04-26 NOTE — Progress Notes (Signed)
 Results through MyChart

## 2024-04-26 NOTE — Telephone Encounter (Signed)
 Copied from CRM [id number]. Topic: Clinical - Lab/Test Results >> Apr 26, 2024 11:23 AM Selinda RAMAN wrote: Reason for CRM: The patient called in returning a call to [facility] about her ABIs. After calling and speaking with Samule she said to just tell the patient her ABIs are normal which I did. She was appreciative and has no further questions at this time.

## 2024-04-30 ENCOUNTER — Ambulatory Visit: Admitting: Physical Therapy

## 2024-04-30 ENCOUNTER — Ambulatory Visit: Admitting: Radiology

## 2024-05-01 ENCOUNTER — Ambulatory Visit: Admitting: Radiology

## 2024-05-07 ENCOUNTER — Encounter: Admitting: Vascular Surgery

## 2024-05-07 ENCOUNTER — Ambulatory Visit

## 2024-05-10 ENCOUNTER — Other Ambulatory Visit: Payer: Self-pay | Admitting: Medical

## 2024-05-10 ENCOUNTER — Other Ambulatory Visit: Payer: Self-pay

## 2024-05-15 ENCOUNTER — Ambulatory Visit

## 2024-05-17 ENCOUNTER — Ambulatory Visit: Admitting: Radiology

## 2024-05-22 ENCOUNTER — Ambulatory Visit: Admit: 2024-05-22

## 2024-05-22 SURGERY — ANTERIOR LUMBAR FUSION 1 LEVEL
Anesthesia: General

## 2024-05-27 ENCOUNTER — Other Ambulatory Visit

## 2024-07-08 ENCOUNTER — Encounter: Payer: Self-pay | Admitting: Medical
